# Patient Record
Sex: Female | Born: 1935
Health system: Southern US, Community
[De-identification: ages and names within clinical notes are randomized; demographics above are authoritative.]

## PROBLEM LIST (undated history)

## (undated) DIAGNOSIS — M1990 Unspecified osteoarthritis, unspecified site: Secondary | ICD-10-CM

## (undated) DIAGNOSIS — I10 Essential (primary) hypertension: Secondary | ICD-10-CM

## (undated) DIAGNOSIS — E7800 Pure hypercholesterolemia, unspecified: Secondary | ICD-10-CM

## (undated) DIAGNOSIS — Z0000 Encounter for general adult medical examination without abnormal findings: Secondary | ICD-10-CM

## (undated) DIAGNOSIS — M545 Low back pain: Secondary | ICD-10-CM

## (undated) DIAGNOSIS — R030 Elevated blood-pressure reading, without diagnosis of hypertension: Secondary | ICD-10-CM

## (undated) DIAGNOSIS — D649 Anemia, unspecified: Secondary | ICD-10-CM

## (undated) DIAGNOSIS — F430 Acute stress reaction: Secondary | ICD-10-CM

## (undated) HISTORY — DX: Encounter for general adult medical examination without abnormal findings: Z00.00

## (undated) HISTORY — DX: Essential (primary) hypertension: I10

## (undated) HISTORY — PX: CHOLECYSTECTOMY: SHX55

## (undated) HISTORY — DX: Low back pain: M54.5

## (undated) HISTORY — DX: Acute stress reaction: F43.0

## (undated) HISTORY — DX: Elevated blood-pressure reading, without diagnosis of hypertension: R03.0

## (undated) HISTORY — DX: Anemia, unspecified: D64.9

## (undated) HISTORY — DX: Unspecified osteoarthritis, unspecified site: M19.90

## (undated) HISTORY — DX: Pure hypercholesterolemia, unspecified: E78.00

## (undated) HISTORY — DX: Hypocalcemia: E83.51

---

## 1982-07-29 HISTORY — PX: GALLBLADDER SURGERY: SHX652

## 2011-08-09 DIAGNOSIS — J209 Acute bronchitis, unspecified: Secondary | ICD-10-CM | POA: Diagnosis not present

## 2011-08-09 DIAGNOSIS — I10 Essential (primary) hypertension: Secondary | ICD-10-CM | POA: Diagnosis not present

## 2011-08-16 DIAGNOSIS — M1990 Unspecified osteoarthritis, unspecified site: Secondary | ICD-10-CM | POA: Diagnosis not present

## 2011-08-16 DIAGNOSIS — E785 Hyperlipidemia, unspecified: Secondary | ICD-10-CM | POA: Diagnosis not present

## 2011-08-16 DIAGNOSIS — E559 Vitamin D deficiency, unspecified: Secondary | ICD-10-CM | POA: Diagnosis not present

## 2011-08-16 DIAGNOSIS — I10 Essential (primary) hypertension: Secondary | ICD-10-CM | POA: Diagnosis not present

## 2011-08-23 DIAGNOSIS — E785 Hyperlipidemia, unspecified: Secondary | ICD-10-CM | POA: Diagnosis not present

## 2011-08-23 DIAGNOSIS — M810 Age-related osteoporosis without current pathological fracture: Secondary | ICD-10-CM | POA: Diagnosis not present

## 2011-08-23 DIAGNOSIS — I10 Essential (primary) hypertension: Secondary | ICD-10-CM | POA: Diagnosis not present

## 2011-11-14 DIAGNOSIS — E785 Hyperlipidemia, unspecified: Secondary | ICD-10-CM | POA: Diagnosis not present

## 2011-11-14 DIAGNOSIS — I10 Essential (primary) hypertension: Secondary | ICD-10-CM | POA: Diagnosis not present

## 2011-11-22 DIAGNOSIS — E785 Hyperlipidemia, unspecified: Secondary | ICD-10-CM | POA: Diagnosis not present

## 2011-11-22 DIAGNOSIS — I10 Essential (primary) hypertension: Secondary | ICD-10-CM | POA: Diagnosis not present

## 2011-12-27 DIAGNOSIS — Z1231 Encounter for screening mammogram for malignant neoplasm of breast: Secondary | ICD-10-CM | POA: Diagnosis not present

## 2011-12-27 DIAGNOSIS — E785 Hyperlipidemia, unspecified: Secondary | ICD-10-CM | POA: Diagnosis not present

## 2011-12-27 DIAGNOSIS — Z23 Encounter for immunization: Secondary | ICD-10-CM | POA: Diagnosis not present

## 2011-12-27 DIAGNOSIS — I10 Essential (primary) hypertension: Secondary | ICD-10-CM | POA: Diagnosis not present

## 2011-12-27 DIAGNOSIS — M810 Age-related osteoporosis without current pathological fracture: Secondary | ICD-10-CM | POA: Diagnosis not present

## 2011-12-30 DIAGNOSIS — Z111 Encounter for screening for respiratory tuberculosis: Secondary | ICD-10-CM | POA: Diagnosis not present

## 2012-01-01 DIAGNOSIS — Z23 Encounter for immunization: Secondary | ICD-10-CM | POA: Diagnosis not present

## 2012-01-07 DIAGNOSIS — Z1231 Encounter for screening mammogram for malignant neoplasm of breast: Secondary | ICD-10-CM | POA: Diagnosis not present

## 2012-03-26 ENCOUNTER — Telehealth: Payer: Self-pay | Admitting: Internal Medicine

## 2012-03-26 NOTE — Telephone Encounter (Signed)
Received medical records from [facility]  P: [phone] F: [phone]

## 2012-04-24 ENCOUNTER — Ambulatory Visit (INDEPENDENT_AMBULATORY_CARE_PROVIDER_SITE_OTHER): Payer: Medicare Other | Admitting: Internal Medicine

## 2012-04-24 ENCOUNTER — Encounter: Payer: Self-pay | Admitting: Internal Medicine

## 2012-04-24 ENCOUNTER — Other Ambulatory Visit: Payer: Self-pay | Admitting: Internal Medicine

## 2012-04-24 VITALS — BP 141/83 | HR 88 | Temp 97.5°F | Resp 14 | Ht <= 58 in | Wt 106.5 lb

## 2012-04-24 DIAGNOSIS — Z79899 Other long term (current) drug therapy: Secondary | ICD-10-CM | POA: Diagnosis not present

## 2012-04-24 DIAGNOSIS — Z23 Encounter for immunization: Secondary | ICD-10-CM

## 2012-04-24 DIAGNOSIS — E782 Mixed hyperlipidemia: Secondary | ICD-10-CM

## 2012-04-24 DIAGNOSIS — I10 Essential (primary) hypertension: Secondary | ICD-10-CM

## 2012-04-24 DIAGNOSIS — M810 Age-related osteoporosis without current pathological fracture: Secondary | ICD-10-CM

## 2012-04-24 DIAGNOSIS — E559 Vitamin D deficiency, unspecified: Secondary | ICD-10-CM

## 2012-04-24 DIAGNOSIS — E785 Hyperlipidemia, unspecified: Secondary | ICD-10-CM | POA: Diagnosis not present

## 2012-04-24 HISTORY — DX: Age-related osteoporosis without current pathological fracture: M81.0

## 2012-04-24 HISTORY — DX: Mixed hyperlipidemia: E78.2

## 2012-04-24 HISTORY — DX: Essential (primary) hypertension: I10

## 2012-04-24 HISTORY — DX: Vitamin D deficiency, unspecified: E55.9

## 2012-04-24 LAB — CBC WITH DIFFERENTIAL/PLATELET
Lymphocytes Relative: 38 % (ref 12–46)
Lymphs Abs: 2.3 10*3/uL (ref 0.7–4.0)
Neutro Abs: 2.9 10*3/uL (ref 1.7–7.7)
Neutrophils Relative %: 48 % (ref 43–77)
Platelets: 253 10*3/uL (ref 150–400)
RBC: 4.96 MIL/uL (ref 3.87–5.11)
WBC: 5.9 10*3/uL (ref 4.0–10.5)

## 2012-04-24 MED ORDER — HYDROCHLOROTHIAZIDE 12.5 MG PO CAPS: 12.5000 mg | ORAL_CAPSULE | Freq: Every day | ORAL | Status: DC

## 2012-04-24 MED ORDER — SIMVASTATIN 40 MG PO TABS: 40.0000 mg | ORAL_TABLET | Freq: Every evening | ORAL | Status: DC

## 2012-04-24 MED ORDER — CARVEDILOL 12.5 MG PO TABS: 12.5000 mg | ORAL_TABLET | Freq: Every day | ORAL | Status: DC

## 2012-04-24 NOTE — Assessment & Plan Note (Signed)
Obtain fasting lipid profile and liver function test 

## 2012-04-24 NOTE — Progress Notes (Signed)
  Subjective:    Patient ID: Jennifer Wilkins, female    DOB: 1931-11-24, 78 y.o.   MRN: [id number]  HPI patient presented to clinic to establish primary medical care. Recently moved from [place]. Outside records reviewed. History of hyperlipidemia tolerating statin therapy without myalgias or abnormalities. States she does take half dose of 40 mg. Blood pressure is minimally elevated despite two agents. Outpatient control is unknown. Chart review indicates history of osteoporosis but is overdue for her bone density. Tolerates Fosamax on a weekly basis. Mammogram up-to-date June 2013. Believes tetanus shot and Pneumovax were updated twenty thirteen. Last reported colonoscopy nineteen ninety-seven but declines further colonoscopies in the future.  Past Medical History  Diagnosis Date  . Hypercholesteremia   . Arthritis   . HTN (hypertension)    Past Surgical History  Procedure Date  . Gallbladder surgery 1984    Gallstone removal    reports that she has never smoked. She does not have any smokeless tobacco history on file. Her alcohol and drug histories not on file. family history is not on file. No Known Allergies   Review of Systems  Musculoskeletal: Negative for myalgias.  All other systems reviewed and are negative.       Objective:   Physical Exam  Nursing note and vitals reviewed. Constitutional: She appears well-developed and well-nourished. No distress.  HENT:  Head: Normocephalic and atraumatic.  Right Ear: External ear normal.  Left Ear: External ear normal.  Eyes: Conjunctivae normal are normal. No scleral icterus.  Neck: Neck supple.  Cardiovascular: Normal rate, regular rhythm and normal heart sounds.  Exam reveals no gallop and no friction rub.   No murmur heard. Pulmonary/Chest: Effort normal and breath sounds normal. No respiratory distress. She has no wheezes. She has no rales.  Lymphadenopathy:    She has no cervical adenopathy.  Neurological: She is alert.    Skin: Skin is warm and dry. She is not diaphoretic.  Psychiatric: She has a normal mood and affect.          Assessment & Plan:

## 2012-04-24 NOTE — Assessment & Plan Note (Signed)
Schedule bone density. Continue Fosamax.

## 2012-04-24 NOTE — Assessment & Plan Note (Signed)
-   Obtain vitamin D level

## 2012-04-24 NOTE — Assessment & Plan Note (Addendum)
Minimal elevation. Continue current regimen and recommend outpatient blood pressure monitoring with results submitted to clinic for review. Obtain CBC and Chem-7

## 2012-04-25 LAB — BASIC METABOLIC PANEL
CO2: 29 mEq/L (ref 19–32)
Calcium: 9.4 mg/dL (ref 8.4–10.5)
Chloride: 101 mEq/L (ref 96–112)
Sodium: 140 mEq/L (ref 135–145)

## 2012-04-25 LAB — LIPID PANEL
HDL: 90 mg/dL (ref >39)
LDL Cholesterol: 116 mg/dL — ABNORMAL HIGH (ref 0–99)
Total CHOL/HDL Ratio: 2.5 Ratio

## 2012-04-25 LAB — HEPATIC FUNCTION PANEL
AST: 19 U/L (ref 0–37)
Albumin: 4.3 g/dL (ref 3.5–5.2)
Total Bilirubin: 0.8 mg/dL (ref 0.3–1.2)

## 2012-04-25 LAB — VITAMIN D 25 HYDROXY (VIT D DEFICIENCY, FRACTURES): Vit D, 25-Hydroxy: 43 ng/mL (ref 30–89)

## 2012-04-28 ENCOUNTER — Ambulatory Visit (INDEPENDENT_AMBULATORY_CARE_PROVIDER_SITE_OTHER): Payer: Medicare Other

## 2012-04-28 DIAGNOSIS — M810 Age-related osteoporosis without current pathological fracture: Secondary | ICD-10-CM | POA: Diagnosis not present

## 2012-04-28 DIAGNOSIS — Z780 Asymptomatic menopausal state: Secondary | ICD-10-CM | POA: Diagnosis not present

## 2012-05-28 ENCOUNTER — Telehealth: Payer: Self-pay | Admitting: *Deleted

## 2012-05-28 NOTE — Telephone Encounter (Signed)
Patient informed; Ok to start Reclast process with Insurance/SLS

## 2012-05-28 NOTE — Telephone Encounter (Signed)
Message copied by Regis Bill on Thu May 28, 2012  4:49 PM ------      Message from: Edwyna Perfect      Created: Sun May 24, 2012  2:03 PM       Has osteoporosis on bmd despite taking fosamax. Recommend reclast infusion if willing

## 2012-06-09 DIAGNOSIS — H04129 Dry eye syndrome of unspecified lacrimal gland: Secondary | ICD-10-CM | POA: Diagnosis not present

## 2012-07-07 DIAGNOSIS — F4322 Adjustment disorder with anxiety: Secondary | ICD-10-CM | POA: Diagnosis not present

## 2012-07-10 ENCOUNTER — Ambulatory Visit: Payer: Self-pay | Admitting: Internal Medicine

## 2012-07-10 ENCOUNTER — Encounter: Payer: Self-pay | Admitting: Internal Medicine

## 2012-07-10 ENCOUNTER — Ambulatory Visit (INDEPENDENT_AMBULATORY_CARE_PROVIDER_SITE_OTHER): Payer: Medicare Other | Admitting: Internal Medicine

## 2012-07-10 VITALS — BP 130/82 | HR 92 | Temp 97.7°F | Resp 14 | Wt 108.2 lb

## 2012-07-10 DIAGNOSIS — M810 Age-related osteoporosis without current pathological fracture: Secondary | ICD-10-CM | POA: Diagnosis not present

## 2012-07-10 DIAGNOSIS — I10 Essential (primary) hypertension: Secondary | ICD-10-CM | POA: Diagnosis not present

## 2012-07-10 NOTE — Progress Notes (Signed)
  Subjective:    Patient ID: Jennifer Wilkins, female    DOB: [birth year], 78 y.o.   MRN: [id number]  HPI Pt presents to clinic for followup of multiple medical problems. bp rechecked 130/82 and has been normal on home monitoring.no active complaints.  Past Medical History  Diagnosis Date  . Hypercholesteremia   . Arthritis   . HTN (hypertension)    Past Surgical History  Procedure Date  . Gallbladder surgery 1984    Gallstone removal    reports that she has never smoked. She does not have any smokeless tobacco history on file. Her alcohol and drug histories not on file. family history is not on file. No Known Allergies    Review of Systems see hpi     Objective:   Physical Exam  Physical Exam  Nursing note and vitals reviewed. Constitutional: Appears well-developed and well-nourished. No distress.  HENT:  Head: Normocephalic and atraumatic.  Right Ear: External ear normal.  Left Ear: External ear normal.  Eyes: Conjunctivae are normal. No scleral icterus.  Neck: Neck supple. Carotid bruit is not present.  Cardiovascular: Normal rate, regular rhythm and normal heart sounds.  Exam reveals no gallop and no friction rub.   No murmur heard. Pulmonary/Chest: Effort normal and breath sounds normal. No respiratory distress. He has no wheezes. no rales.  Lymphadenopathy:    He has no cervical adenopathy.  Neurological:Alert.  Skin: Skin is warm and dry. Not diaphoretic.  Psychiatric: Has a normal mood and affect.        Assessment & Plan:

## 2012-07-11 NOTE — Assessment & Plan Note (Signed)
Schedule reclast

## 2012-07-11 NOTE — Assessment & Plan Note (Signed)
Normotensive and stable. Continue current regimen. Monitor bp as outpt and followup in clinic as scheduled.  

## 2012-07-13 ENCOUNTER — Telehealth: Payer: Self-pay | Admitting: *Deleted

## 2012-07-13 DIAGNOSIS — M810 Age-related osteoporosis without current pathological fracture: Secondary | ICD-10-CM

## 2012-07-13 DIAGNOSIS — Z01812 Encounter for preprocedural laboratory examination: Secondary | ICD-10-CM

## 2012-07-13 NOTE — Telephone Encounter (Signed)
Informed pt that labs are needed prior to placing Order with Short Stay Infusion facility to arrange appointment for Reclast infusion; informed pt that facility will be filing Insurance & she can either speak w/Outpatient Infusion Center or call her Insurance directly for coverage information RE: Reclast infusion. Pt will be in this week to have labs done; orders placed/SLS

## 2012-07-13 NOTE — Telephone Encounter (Signed)
Started Reclast process on Friday, 12.13.13; attempting to find out how to start process w/o "Reclast & You" program [canceled d/t budget]/SLS

## 2012-07-16 DIAGNOSIS — M810 Age-related osteoporosis without current pathological fracture: Secondary | ICD-10-CM | POA: Diagnosis not present

## 2012-07-16 DIAGNOSIS — Z01812 Encounter for preprocedural laboratory examination: Secondary | ICD-10-CM | POA: Diagnosis not present

## 2012-07-16 NOTE — Telephone Encounter (Signed)
Orders released and given to the lab.

## 2012-07-17 ENCOUNTER — Other Ambulatory Visit: Payer: Self-pay | Admitting: *Deleted

## 2012-07-17 DIAGNOSIS — M810 Age-related osteoporosis without current pathological fracture: Secondary | ICD-10-CM

## 2012-07-17 NOTE — Progress Notes (Signed)
Faxed order for Reclast to [facility] Short Stay w/ lab results/SLS

## 2012-07-20 ENCOUNTER — Ambulatory Visit: Payer: TRICARE For Life (TFL) | Admitting: Internal Medicine

## 2012-08-05 ENCOUNTER — Encounter (HOSPITAL_COMMUNITY): Payer: Medicare Other

## 2012-08-05 ENCOUNTER — Ambulatory Visit (HOSPITAL_COMMUNITY): Admission: RE | Admit: 2012-08-05 | Payer: Medicare Other | Source: Ambulatory Visit

## 2012-08-10 ENCOUNTER — Telehealth: Payer: Self-pay | Admitting: *Deleted

## 2012-08-10 ENCOUNTER — Encounter (HOSPITAL_COMMUNITY): Payer: Self-pay

## 2012-08-10 ENCOUNTER — Ambulatory Visit (HOSPITAL_COMMUNITY)
Admission: RE | Admit: 2012-08-10 | Discharge: 2012-08-10 | Disposition: A | Discharge status: Home / Self Care | Payer: Medicare Other | Source: Ambulatory Visit | Attending: Internal Medicine | Admitting: Internal Medicine

## 2012-08-10 VITALS — BP 110/77 | HR 95 | Temp 97.7°F | Resp 16

## 2012-08-10 DIAGNOSIS — M810 Age-related osteoporosis without current pathological fracture: Secondary | ICD-10-CM | POA: Insufficient documentation

## 2012-08-10 DIAGNOSIS — Z01812 Encounter for preprocedural laboratory examination: Secondary | ICD-10-CM

## 2012-08-10 MED ORDER — SODIUM CHLORIDE 0.9 % IV SOLN
INTRAVENOUS | Status: DC
  Administered 2012-08-10: 14:00:00 via INTRAVENOUS

## 2012-08-10 MED ORDER — ZOLEDRONIC ACID 5 MG/100ML IV SOLN: 5.0000 mg | Freq: Once | INTRAVENOUS | Status: DC

## 2012-08-10 NOTE — Telephone Encounter (Signed)
Received call from [place] at [facility] Stay stating pt is there for reclast infusion and pt reports that she is currently taking fosamax. [place] states that Reclast is not supposed to be given if pt is on Fosamax. She is wanting to know if it is ok to proceed with Reclast or get clarification on how you would like them to proceed?  Please advise.

## 2012-08-10 NOTE — Progress Notes (Signed)
Patient currently taking Fosamax. Last dose today. Called Dr. Rodena Medin to verify if patient should receive Reclast while taking Fosamax. He stated not to give Reclast infusion today, instruct  to discontinue Fosamax and office will call to reschedule. I instructed patient to stop taking Fosamax. She verbalized understanding. Also informed to follow up with Dr. Ilda Foil office for questions and concerns. She was discharged ambulatory with husband.

## 2012-08-10 NOTE — Telephone Encounter (Signed)
Per verbal from Provider, advised [place] to cancel Reclast infusion today and please reiterate to pt not to take any further Fosamax and let pt know we will contact them for follow up.

## 2012-08-11 NOTE — Telephone Encounter (Signed)
Informed patient of provider recommendations, had to reiterate about D/C Fosamax [told patient to discard medication completely, so as not to forget that she will no longer take this Rx], as she will be starting a new form of medication for her bone loss prevention; understood & agreed/SLS Spoke w/[facility] short stay; labs & new order will have to be repeated/SLS

## 2012-08-11 NOTE — Telephone Encounter (Signed)
Wait 1 month then proceed

## 2012-08-14 NOTE — Telephone Encounter (Signed)
Informed patient that she will need to re-do [calcium & creatinine] labs d/t prior results will go past the 30-day period that is required prior to Reclast infusion, pt will come in no earlier than week of 01.27.14; pt understood & agreed, will fax new order after repeat labs/SLS

## 2012-09-01 ENCOUNTER — Telehealth: Payer: Self-pay | Admitting: Internal Medicine

## 2012-09-01 NOTE — Telephone Encounter (Signed)
PATIENT WAS SCHEDULED FOR RECLAST AT [facility] ON 1-8.  sHE WAS NOT AWARE OF THIS APPT. IT WAS RESCHEDULED TO 08-10-2012.  PATIENT ARRIVED AT [facility] FOR HER RECLAST.  IV WAS STARTED AND THEN THE NURSE ASKED ABOUT HER MEDS.  SHE HAD NOT BEEN TOLD TO STOP TAKING HER ALENDRONATE SOD 70MG  PREVIOUS TO THIS INFUSION.  THEY DID NOT DO THE INFUSION BUT THEY BILLED MEDICARE.  NOW THE PATIENT CANNOT HAVE IT DONE AS MEDICARE THINKS IT ALREADY HAS BEEN.  WILL SEND E MAIL TO MANAGER OF SHORTSTAY AT [facility] AND TO BRIAN HUNT TO TRY AND RESOLVE THIS ISSUE SO PATIENT CAN HAVE HER INFUSION.  SHE WILL NEED LABS WITHIN 30 DAYS PRIOR, SO LET THE PATIENT KNOW SOON ENOUGH PRIOR TO APPT TO HAVE THESE DONE

## 2012-09-01 NOTE — Telephone Encounter (Signed)
Let us know when this gets resolved so labs can be ordered and pt notified.

## 2012-09-25 NOTE — Telephone Encounter (Signed)
Sent this information to Rolinda Roan asking how to resolve this.  He advised he really did not know as he cannot even see hospital charges.  I left a message for the patient that she will have to call [facility] and get it straightened out with them as we cannot help with hospital charges

## 2012-09-25 NOTE — Telephone Encounter (Signed)
See above

## 2012-11-03 ENCOUNTER — Encounter: Payer: Self-pay | Admitting: Family Medicine

## 2012-11-03 ENCOUNTER — Other Ambulatory Visit: Payer: Self-pay | Admitting: Family Medicine

## 2012-11-03 ENCOUNTER — Ambulatory Visit (INDEPENDENT_AMBULATORY_CARE_PROVIDER_SITE_OTHER): Payer: Medicare Other | Admitting: Family Medicine

## 2012-11-03 VITALS — BP 138/88 | HR 83 | Temp 97.6°F | Ht <= 58 in | Wt 110.1 lb

## 2012-11-03 DIAGNOSIS — E785 Hyperlipidemia, unspecified: Secondary | ICD-10-CM | POA: Diagnosis not present

## 2012-11-03 DIAGNOSIS — M810 Age-related osteoporosis without current pathological fracture: Secondary | ICD-10-CM

## 2012-11-03 DIAGNOSIS — I10 Essential (primary) hypertension: Secondary | ICD-10-CM

## 2012-11-03 DIAGNOSIS — E559 Vitamin D deficiency, unspecified: Secondary | ICD-10-CM

## 2012-11-03 DIAGNOSIS — E7800 Pure hypercholesterolemia, unspecified: Secondary | ICD-10-CM

## 2012-11-03 LAB — BASIC METABOLIC PANEL
CO2: 30 mEq/L (ref 19–32)
Calcium: 9.9 mg/dL (ref 8.4–10.5)
Creat: 0.8 mg/dL (ref 0.50–1.10)
Glucose, Bld: 80 mg/dL (ref 70–99)

## 2012-11-03 LAB — CBC
HCT: 43.9 % (ref 36.0–46.0)
Hemoglobin: 14.9 g/dL (ref 12.0–15.0)
MCHC: 33.9 g/dL (ref 30.0–36.0)
MCV: 84.6 fL (ref 78.0–100.0)

## 2012-11-03 LAB — PHOSPHORUS: Phosphorus: 3.4 mg/dL (ref 2.3–4.6)

## 2012-11-03 LAB — HEPATIC FUNCTION PANEL
ALT: 17 U/L (ref 0–35)
AST: 18 U/L (ref 0–37)
Bilirubin, Direct: 0.2 mg/dL (ref 0.0–0.3)
Total Protein: 7 g/dL (ref 6.0–8.3)

## 2012-11-03 LAB — TSH: TSH: 1.908 u[IU]/mL (ref 0.350–4.500)

## 2012-11-03 NOTE — Patient Instructions (Addendum)

## 2012-11-03 NOTE — Assessment & Plan Note (Signed)
Well controlled but encouraged to consider the DASH diet.

## 2012-11-03 NOTE — Progress Notes (Signed)
Patient ID: Jennifer Wilkins, female   DOB: May 04, 1932, 79 y.o.   MRN: [id number] Jennifer Wilkins [id number] Feb 06, 1932 11/03/2012      Progress Note-Follow Up  Subjective  Chief Complaint  Chief Complaint  Patient presents with  . Follow-up    4 month    HPI  Patient is a 79 year old female comes in today for followup. She. Dispense recent illness. She has tried to have frequent infusions twice but has been unsuccessful. No headaches or chest pain. No palpitations or shortness of breath. No GI or GU complaints.  Past Medical History  Diagnosis Date  . Hypercholesteremia   . Arthritis   . HTN (hypertension)     Past Surgical History  Procedure Laterality Date  . Gallbladder surgery  1984    Gallstone removal    History reviewed. No pertinent family history.  History   Social History  . Marital Status: Married    Spouse Name: N/A    Number of Children: N/A  . Years of Education: N/A   Occupational History  . Not on file.   Social History Main Topics  . Smoking status: Never Smoker   . Smokeless tobacco: Never Used  . Alcohol Use: No  . Drug Use: Not on file  . Sexually Active: Not on file   Other Topics Concern  . Not on file   Social History Narrative  . No narrative on file    Current Outpatient Prescriptions on File Prior to Visit  Medication Sig Dispense Refill  . aspirin 81 MG tablet Take 81 mg by mouth daily.      . Calcium Carbonate-Vitamin D (CALCIUM 600/VITAMIN D) 600-400 MG-UNIT per tablet Take 1 tablet by mouth 2 (two) times daily.      . carvedilol (COREG) 12.5 MG tablet Take 1 tablet (12.5 mg total) by mouth daily.  90 tablet  2  . cholecalciferol (VITAMIN D) 400 UNITS TABS Take 2,000 Units by mouth daily.      . Cyanocobalamin (VITAMIN B-12 CR) 1500 MCG TBCR Take by mouth daily.      . hydrochlorothiazide (MICROZIDE) 12.5 MG capsule Take 1 capsule (12.5 mg total) by mouth daily.  90 capsule  2  . Misc Natural Products (OSTEO BI-FLEX ADV JOINT SHIELD  PO) Take by mouth daily.      . simvastatin (ZOCOR) 40 MG tablet Take 1 tablet (40 mg total) by mouth every evening.  90 tablet  2  . alendronate (FOSAMAX) 70 MG tablet Take 70 mg by mouth every 7 (seven) days. Take with a full glass of water on an empty stomach.       No current facility-administered medications on file prior to visit.    No Known Allergies  Review of Systems  Review of Systems  Constitutional: Negative for fever and malaise/fatigue.  HENT: Negative for congestion.   Eyes: Negative for discharge.  Respiratory: Negative for shortness of breath.   Cardiovascular: Negative for chest pain, palpitations and leg swelling.  Gastrointestinal: Negative for nausea, abdominal pain and diarrhea.  Genitourinary: Negative for dysuria.  Musculoskeletal: Negative for falls.  Skin: Negative for rash.  Neurological: Negative for loss of consciousness and headaches.  Endo/Heme/Allergies: Negative for polydipsia.  Psychiatric/Behavioral: Negative for depression and suicidal ideas. The patient is not nervous/anxious and does not have insomnia.     Objective  BP 138/88  Pulse 83  Temp(Src) 97.6 F (36.4 C) (Oral)  Ht 4\' 9"  (1.448 m)  Wt 110 lb 1.3 oz (49.932  kg)  BMI 23.81 kg/m2  SpO2 97%  Physical Exam  Physical Exam  Constitutional: She is oriented to person, place, and time and well-developed, well-nourished, and in no distress. No distress.  HENT:  Head: Normocephalic and atraumatic.  Eyes: Conjunctivae are normal.  Neck: Neck supple. No thyromegaly present.  Cardiovascular: Normal rate, regular rhythm and normal heart sounds.   No murmur heard. Pulmonary/Chest: Effort normal and breath sounds normal. She has no wheezes.  Abdominal: She exhibits no distension and no mass.  Musculoskeletal: She exhibits no edema.  Lymphadenopathy:    She has no cervical adenopathy.  Neurological: She is alert and oriented to person, place, and time.  Skin: Skin is warm and dry. No  rash noted. She is not diaphoretic.  Psychiatric: Memory, affect and judgment normal.    No results found for this basename: TSH   Lab Results  Component Value Date   WBC 5.4 11/03/2012   HGB 14.9 11/03/2012   HCT 43.9 11/03/2012   MCV 84.6 11/03/2012   PLT 223 11/03/2012   Lab Results  Component Value Date   CREATININE 0.85 07/16/2012   BUN 21 04/24/2012   NA 140 04/24/2012   K 4.0 04/24/2012   CL 101 04/24/2012   CO2 29 04/24/2012   Lab Results  Component Value Date   ALT 15 04/24/2012   AST 19 04/24/2012   ALKPHOS 58 04/24/2012   BILITOT 0.8 04/24/2012   Lab Results  Component Value Date   CHOL 224* 04/24/2012   Lab Results  Component Value Date   HDL 90 04/24/2012   Lab Results  Component Value Date   LDLCALC 116* 04/24/2012   Lab Results  Component Value Date   TRIG 90 04/24/2012   Lab Results  Component Value Date   CHOLHDL 2.5 04/24/2012     Assessment & Plan  Osteoporosis She was supposed to have a Reclast infusion but was unaware of her first appt and had taken her Fosamax prior to her next appt so never actually received the infusion. Unfortunately she and her family report they received a bill for the medication that she never received. Patient is not interested in returning for an infusion again after this but will consider Prolia.   Other and unspecified hyperlipidemia Tolerating Simvastatin, no changes  Unspecified essential hypertension Well controlled but encouraged to consider the DASH diet.

## 2012-11-03 NOTE — Assessment & Plan Note (Signed)
Tolerating Simvastatin, no changes 

## 2012-11-03 NOTE — Assessment & Plan Note (Signed)
She was supposed to have a Reclast infusion but was unaware of her first appt and had taken her Fosamax prior to her next appt so never actually received the infusion. Unfortunately she and her family report they received a bill for the medication that she never received. Patient is not interested in returning for an infusion again after this but will consider Prolia.

## 2013-01-07 ENCOUNTER — Telehealth: Payer: Self-pay

## 2013-01-07 NOTE — Telephone Encounter (Signed)
So just let me know when we know what it is

## 2013-01-07 NOTE — Telephone Encounter (Signed)
Pt states she is faxing some paperwork from the insurance company stating that they are no longer going to cover some medication

## 2013-01-08 ENCOUNTER — Other Ambulatory Visit: Payer: Self-pay

## 2013-01-08 MED ORDER — SIMVASTATIN 40 MG PO TABS: 40.0000 mg | ORAL_TABLET | Freq: Every evening | ORAL | Status: DC

## 2013-01-25 ENCOUNTER — Other Ambulatory Visit: Payer: Self-pay

## 2013-01-25 MED ORDER — HYDROCHLOROTHIAZIDE 12.5 MG PO CAPS: 12.5000 mg | ORAL_CAPSULE | Freq: Every day | ORAL | Status: DC

## 2013-01-25 MED ORDER — CARVEDILOL 12.5 MG PO TABS: 12.5000 mg | ORAL_TABLET | Freq: Every day | ORAL | Status: DC

## 2013-03-21 ENCOUNTER — Other Ambulatory Visit: Payer: Self-pay | Admitting: Family Medicine

## 2013-04-28 DIAGNOSIS — Z23 Encounter for immunization: Secondary | ICD-10-CM | POA: Diagnosis not present

## 2013-06-07 ENCOUNTER — Telehealth: Payer: Self-pay | Admitting: *Deleted

## 2013-06-07 ENCOUNTER — Other Ambulatory Visit: Payer: Self-pay | Admitting: Family Medicine

## 2013-06-07 DIAGNOSIS — I10 Essential (primary) hypertension: Secondary | ICD-10-CM

## 2013-06-07 DIAGNOSIS — E785 Hyperlipidemia, unspecified: Secondary | ICD-10-CM

## 2013-06-07 DIAGNOSIS — Z79899 Other long term (current) drug therapy: Secondary | ICD-10-CM

## 2013-06-07 LAB — LIPID PANEL
HDL: 84 mg/dL (ref >39)
LDL Cholesterol: 58 mg/dL (ref 0–99)
Total CHOL/HDL Ratio: 1.8 Ratio
VLDL: 13 mg/dL (ref 0–40)

## 2013-06-07 LAB — CBC
HCT: 42 % (ref 36.0–46.0)
MCV: 86.6 fL (ref 78.0–100.0)
RBC: 4.85 MIL/uL (ref 3.87–5.11)
WBC: 4.8 10*3/uL (ref 4.0–10.5)

## 2013-06-07 LAB — HEPATIC FUNCTION PANEL
ALT: 16 U/L (ref 0–35)
Bilirubin, Direct: 0.2 mg/dL (ref 0.0–0.3)
Total Protein: 6.5 g/dL (ref 6.0–8.3)

## 2013-06-07 LAB — BASIC METABOLIC PANEL
Potassium: 3.9 mEq/L (ref 3.5–5.3)
Sodium: 139 mEq/L (ref 135–145)

## 2013-06-07 NOTE — Telephone Encounter (Signed)
Lab orders placed, forwarded to [facility]/SLS

## 2013-06-08 ENCOUNTER — Other Ambulatory Visit: Payer: Self-pay

## 2013-06-08 LAB — TSH: TSH: 1.534 u[IU]/mL (ref 0.350–4.500)

## 2013-06-14 ENCOUNTER — Ambulatory Visit (INDEPENDENT_AMBULATORY_CARE_PROVIDER_SITE_OTHER): Payer: Medicare Other | Admitting: Family Medicine

## 2013-06-14 ENCOUNTER — Encounter: Payer: Self-pay | Admitting: Family Medicine

## 2013-06-14 VITALS — BP 120/78 | HR 87 | Temp 97.7°F | Ht <= 58 in | Wt 115.0 lb

## 2013-06-14 DIAGNOSIS — E785 Hyperlipidemia, unspecified: Secondary | ICD-10-CM

## 2013-06-14 DIAGNOSIS — Z23 Encounter for immunization: Secondary | ICD-10-CM | POA: Diagnosis not present

## 2013-06-14 DIAGNOSIS — E559 Vitamin D deficiency, unspecified: Secondary | ICD-10-CM

## 2013-06-14 DIAGNOSIS — M810 Age-related osteoporosis without current pathological fracture: Secondary | ICD-10-CM | POA: Diagnosis not present

## 2013-06-14 DIAGNOSIS — I10 Essential (primary) hypertension: Secondary | ICD-10-CM | POA: Diagnosis not present

## 2013-06-14 MED ORDER — PNEUMOCOCCAL 13-VAL CONJ VACC IM SUSP: 0.5000 mL | Freq: Once | INTRAMUSCULAR | Status: DC

## 2013-06-14 NOTE — Patient Instructions (Signed)
Hypocalcemia, Adult °Hypocalcemia is low blood calcium. Calcium is important for cells to function in the body. Low blood calcium can cause a variety of symptoms and problems. °CAUSES  °· Low levels of a body protein called albumin. °· Problems with the parathyroid glands or surgical removal of the parathyroid glands. The parathyroid glands maintain the body's level of calcium. °· Decreased production or improper use of parathyroid hormone. °· Lack (deficiency) of vitamin D or magnesium or both. °· Intestinal problems that interfere with nutrient absorption. °· Alcoholism. °· Kidney problems. °· Inflammation of the pancreas (pancreatitis). °· Certain medicines. °· Severe infections (sepsis). °· Infiltrative diseases. With these diseases the parathyroid glands are filled with cells or substances that are not normally present. Examples include: °· Sarcoidosis. °· Hemachromatosis. °· Breakdown of large amounts of muscle fiber. °· High levels of phosphate in the body. °· Cancer. °· Massive blood transfusions which usually occur with severe trauma. °SYMPTOMS  °· Numbness and tingling in the fingers, toes, or around the mouth. °· Muscle aches or cramps, especially in the legs, feet, and back. °· Muscle twitches. °· Shortness of breath or wheezing. °· Difficulty swallowing. °· Changes in the sound of the voice. °· General weakness. °· Fainting. °· Fast heart beats (palpitations). °· Chest pain. °· Irritability. °· Difficulty thinking. °· Memory problems or confusion. °· Severe fatigue. °· Changes in personality. °· Depression and anxiety. °· Shaking uncontrollably (seizures). °· Coarse, brittle hair and nails. °· Dry skin or lasting (chronic) skin diseases (psoriasis, eczema, or dermatitis). °· Clouding of the eye lens (cataracts). °· Abdominal cramping or pain. °DIAGNOSIS  °Hypocalcemia is usually diagnosed through blood tests that reveal a low level of blood calcium. Other tests, such as a recording of the electrical  activity of the heart (electrocardiogram, EKG), may be performed in order to diagnose the underlying cause of the condition. °TREATMENT  °Treatment for hypocalcemia includes giving calcium supplements. These can be given by mouth or by intravenous (IV) access tube, depending on the severity of the symptoms and deficiency. Other minerals (electrolytes), such as magnesium, may also be given. °HOME CARE INSTRUCTIONS  °· Meet with a dietitian to make sure you are eating the most healthful diet possible, or follow diet instructions as directed by your caregiver. °· Follow up with your caregiver as directed. °SEEK IMMEDIATE MEDICAL CARE IF:  °· You develop chest pain. °· You develop persistent rapid or irregular heartbeats. °· You have difficulty breathing. °· You faint. °· You develop increased fatigue. °· You have new swelling in the feet, ankles, or legs. °· You develop increased muscle twitching. °· You start to have seizures. °· You develop confusion. °· You develop mood, memory, or personality changes. °MAKE SURE YOU:  °· Understand these instructions. °· Will watch your condition. °· Will get help right away if you are not doing well or get worse. °Document Released: 01/02/2010 Document Revised: 10/07/2011 Document Reviewed: 01/02/2010 °[employer]® Patient Information ©2014 [employer]. ° °

## 2013-06-14 NOTE — Progress Notes (Signed)
Pre visit review using our clinic review tool, if applicable. No additional management support is needed unless otherwise documented below in the visit note. 

## 2013-06-17 ENCOUNTER — Other Ambulatory Visit: Payer: Self-pay | Admitting: Family Medicine

## 2013-06-19 ENCOUNTER — Encounter: Payer: Self-pay | Admitting: Family Medicine

## 2013-06-19 HISTORY — DX: Hypocalcemia: E83.51

## 2013-06-19 NOTE — Assessment & Plan Note (Signed)
Well controlled, no changes to meds 

## 2013-06-19 NOTE — Assessment & Plan Note (Signed)
Was supposed to get a Reclast infusion but never did somehow in the process thought they report they were billed accidentally. Will run this by billing, may contine Ca/vit d and Fosamax for now. Consider Prolia in future

## 2013-06-19 NOTE — Assessment & Plan Note (Signed)
Well controlled 

## 2013-06-19 NOTE — Assessment & Plan Note (Signed)
Good response to Simvastatin, no changes. Avoid trans fats

## 2013-06-19 NOTE — Assessment & Plan Note (Signed)
New, increase citracal to tid and repeat renal panel with [facility] in 1-2 weeks. Asymptomatic

## 2013-06-19 NOTE — Progress Notes (Signed)
Patient ID: Jennifer Wilkins, female   DOB: Jan 03, 1932, 79 y.o.   MRN: [id number] Jennifer Wilkins [id number] 1932-01-10 06/19/2013      Progress Note-Follow Up  Subjective  Chief Complaint  Chief Complaint  Patient presents with  . Follow-up    6 month    HPI  Patient is a 21 Caucasian female who is in today by her husband and her daughter. Doing well. Did not have any recent major illness in the emergency room and she is taking her medications as prescribed and tolerating them well. They deny headaches, chest pain, palpitations, shortness of breath, muscle cramps, GI or GU concerns at this time.  Past Medical History  Diagnosis Date  . Hypercholesteremia   . Arthritis   . HTN (hypertension)   . Hypocalcemia 06/19/2013    Past Surgical History  Procedure Laterality Date  . Gallbladder surgery  1984    Gallstone removal    History reviewed. No pertinent family history.  History   Social History  . Marital Status: Married    Spouse Name: N/A    Number of Children: N/A  . Years of Education: N/A   Occupational History  . Not on file.   Social History Main Topics  . Smoking status: Never Smoker   . Smokeless tobacco: Never Used  . Alcohol Use: No  . Drug Use: Not on file  . Sexual Activity: Not on file   Other Topics Concern  . Not on file   Social History Narrative  . No narrative on file    Current Outpatient Prescriptions on File Prior to Visit  Medication Sig Dispense Refill  . alendronate (FOSAMAX) 70 MG tablet Take 70 mg by mouth every 7 (seven) days. Take with a full glass of water on an empty stomach.      Marland Kitchen aspirin 81 MG tablet Take 81 mg by mouth daily.      . Calcium Carbonate-Vitamin D (CALCIUM 600/VITAMIN D) 600-400 MG-UNIT per tablet Take 1 tablet by mouth 2 (two) times daily.      . carvedilol (COREG) 12.5 MG tablet Take 1 tablet (12.5 mg total) by mouth daily.  90 tablet  1  . cholecalciferol (VITAMIN D) 400 UNITS TABS Take 2,000 Units by mouth daily.       . Cyanocobalamin (VITAMIN B-12 CR) 1500 MCG TBCR Take by mouth daily.      . Misc Natural Products (OSTEO BI-FLEX ADV JOINT SHIELD PO) Take by mouth daily.      . simvastatin (ZOCOR) 40 MG tablet TAKE 1 TABLET EVERY EVENING  90 tablet  1   No current facility-administered medications on file prior to visit.    No Known Allergies  Review of Systems  Review of Systems  Constitutional: Negative for fever and malaise/fatigue.  HENT: Negative for congestion.   Eyes: Negative for discharge.  Respiratory: Negative for shortness of breath.   Cardiovascular: Negative for chest pain, palpitations and leg swelling.  Gastrointestinal: Negative for nausea, abdominal pain and diarrhea.  Genitourinary: Negative for dysuria.  Musculoskeletal: Negative for falls.  Skin: Negative for rash.  Neurological: Negative for loss of consciousness and headaches.  Endo/Heme/Allergies: Negative for polydipsia.  Psychiatric/Behavioral: Negative for depression and suicidal ideas. The patient is not nervous/anxious and does not have insomnia.     Objective  BP 120/78  Pulse 87  Temp(Src) 97.7 F (36.5 C) (Oral)  Ht 4\' 9"  (1.448 m)  Wt 115 lb (52.164 kg)  BMI 24.88 kg/m2  SpO2  96%  Physical Exam  Physical Exam  Constitutional: She is oriented to person, place, and time and well-developed, well-nourished, and in no distress. No distress.  HENT:  Head: Normocephalic and atraumatic.  Eyes: Conjunctivae are normal.  Neck: Neck supple. No thyromegaly present.  Cardiovascular: Normal rate, regular rhythm and normal heart sounds.   No murmur heard. Pulmonary/Chest: Effort normal and breath sounds normal. She has no wheezes.  Abdominal: She exhibits no distension and no mass.  Musculoskeletal: She exhibits no edema.  Lymphadenopathy:    She has no cervical adenopathy.  Neurological: She is alert and oriented to person, place, and time.  Skin: Skin is warm and dry. No rash noted. She is not  diaphoretic.  Psychiatric: Memory, affect and judgment normal.    Lab Results  Component Value Date   TSH 1.534 06/07/2013   Lab Results  Component Value Date   WBC 4.8 06/07/2013   HGB 14.2 06/07/2013   HCT 42.0 06/07/2013   MCV 86.6 06/07/2013   PLT 219 06/07/2013   Lab Results  Component Value Date   CREATININE 0.83 06/07/2013   BUN 21 06/07/2013   NA 139 06/07/2013   K 3.9 06/07/2013   CL 103 06/07/2013   CO2 26 06/07/2013   Lab Results  Component Value Date   ALT 16 06/07/2013   AST 19 06/07/2013   ALKPHOS 58 06/07/2013   BILITOT 1.1 06/07/2013   Lab Results  Component Value Date   CHOL 155 06/07/2013   Lab Results  Component Value Date   HDL 84 06/07/2013   Lab Results  Component Value Date   LDLCALC 58 06/07/2013   Lab Results  Component Value Date   TRIG 67 06/07/2013   Lab Results  Component Value Date   CHOLHDL 1.8 06/07/2013     Assessment & Plan  Unspecified essential hypertension Well controlled, no changes to meds  Osteoporosis Was supposed to get a Reclast infusion but never did somehow in the process thought they report they were billed accidentally. Will run this by billing, may contine Ca/vit d and Fosamax for now. Consider Prolia in future  Vitamin D deficiency Well controlled  Other and unspecified hyperlipidemia Good response to Simvastatin, no changes. Avoid trans fats  Hypocalcemia New, increase citracal to tid and repeat renal panel with [facility] in 1-2 weeks. Asymptomatic

## 2013-07-14 ENCOUNTER — Telehealth: Payer: Self-pay | Admitting: *Deleted

## 2013-07-14 LAB — BASIC METABOLIC PANEL
BUN: 28 mg/dL — ABNORMAL HIGH (ref 6–23)
Chloride: 101 mEq/L (ref 96–112)
Creat: 0.87 mg/dL (ref 0.50–1.10)

## 2013-07-14 NOTE — Telephone Encounter (Signed)
Pt presented to lab w/o orders; placed from last AVS/SLS

## 2013-07-16 LAB — PTH, INTACT AND CALCIUM
Calcium: 9.3 mg/dL (ref 8.4–10.5)
PTH: 55.2 pg/mL (ref 14.0–72.0)

## 2013-08-13 ENCOUNTER — Other Ambulatory Visit: Payer: Self-pay | Admitting: Family Medicine

## 2013-09-10 ENCOUNTER — Telehealth: Payer: Self-pay | Admitting: Family Medicine

## 2013-09-10 MED ORDER — HYDROCHLOROTHIAZIDE 12.5 MG PO CAPS: ORAL_CAPSULE | ORAL | Status: DC

## 2013-09-10 MED ORDER — CARVEDILOL 12.5 MG PO TABS: 12.5000 mg | ORAL_TABLET | Freq: Every day | ORAL | Status: DC

## 2013-09-10 NOTE — Telephone Encounter (Signed)
Rx request to pharmacy/SLS  

## 2013-09-10 NOTE — Telephone Encounter (Signed)
Carvedilol 12.5mg  take 1 per day  Qty 90  hydrochlorathiazide 12.5 1 per day  Qty 90

## 2013-09-12 ENCOUNTER — Other Ambulatory Visit: Payer: Self-pay | Admitting: Family Medicine

## 2013-09-20 ENCOUNTER — Ambulatory Visit: Payer: Medicare Other | Admitting: Family Medicine

## 2013-10-21 ENCOUNTER — Ambulatory Visit (INDEPENDENT_AMBULATORY_CARE_PROVIDER_SITE_OTHER): Payer: Medicare Other | Admitting: Family Medicine

## 2013-10-21 ENCOUNTER — Encounter: Payer: Self-pay | Admitting: Family Medicine

## 2013-10-21 ENCOUNTER — Telehealth: Payer: Self-pay | Admitting: Family Medicine

## 2013-10-21 DIAGNOSIS — I10 Essential (primary) hypertension: Secondary | ICD-10-CM

## 2013-10-21 DIAGNOSIS — M810 Age-related osteoporosis without current pathological fracture: Secondary | ICD-10-CM | POA: Diagnosis not present

## 2013-10-21 DIAGNOSIS — E785 Hyperlipidemia, unspecified: Secondary | ICD-10-CM

## 2013-10-21 LAB — RENAL FUNCTION PANEL
ALBUMIN: 4.2 g/dL (ref 3.5–5.2)
BUN: 23 mg/dL (ref 6–23)
CHLORIDE: 102 meq/L (ref 96–112)
CO2: 30 meq/L (ref 19–32)
CREATININE: 0.86 mg/dL (ref 0.50–1.10)
Calcium: 9.5 mg/dL (ref 8.4–10.5)
Glucose, Bld: 86 mg/dL (ref 70–99)
PHOSPHORUS: 3 mg/dL (ref 2.3–4.6)
Potassium: 3.8 mEq/L (ref 3.5–5.3)
Sodium: 143 mEq/L (ref 135–145)

## 2013-10-21 LAB — LIPID PANEL
CHOLESTEROL: 153 mg/dL (ref 0–200)
HDL: 81 mg/dL (ref >39)
LDL CALC: 59 mg/dL (ref 0–99)
TRIGLYCERIDES: 65 mg/dL (ref <150)
Total CHOL/HDL Ratio: 1.9 Ratio
VLDL: 13 mg/dL (ref 0–40)

## 2013-10-21 LAB — CBC
HCT: 41.5 % (ref 36.0–46.0)
HEMOGLOBIN: 13.9 g/dL (ref 12.0–15.0)
MCH: 28.5 pg (ref 26.0–34.0)
MCHC: 33.5 g/dL (ref 30.0–36.0)
MCV: 85.2 fL (ref 78.0–100.0)
PLATELETS: 218 10*3/uL (ref 150–400)
RBC: 4.87 MIL/uL (ref 3.87–5.11)
RDW: 14.1 % (ref 11.5–15.5)
WBC: 5.3 10*3/uL (ref 4.0–10.5)

## 2013-10-21 LAB — HEPATIC FUNCTION PANEL
ALBUMIN: 4.2 g/dL (ref 3.5–5.2)
ALK PHOS: 62 U/L (ref 39–117)
ALT: 16 U/L (ref 0–35)
AST: 21 U/L (ref 0–37)
BILIRUBIN TOTAL: 1.1 mg/dL (ref 0.2–1.2)
Bilirubin, Direct: 0.2 mg/dL (ref 0.0–0.3)
Indirect Bilirubin: 0.9 mg/dL (ref 0.2–1.2)
TOTAL PROTEIN: 6.5 g/dL (ref 6.0–8.3)

## 2013-10-21 LAB — TSH: TSH: 1.605 u[IU]/mL (ref 0.350–4.500)

## 2013-10-21 NOTE — Assessment & Plan Note (Signed)
Tolerating statin, encouraged heart healthy diet, avoid trans fats, minimize simple carbs and saturated fats. Increase exercise as tolerated. Recheck panel today

## 2013-10-21 NOTE — Assessment & Plan Note (Signed)
Taking calcium supplement bid and was improved on recheck will check renal panel today

## 2013-10-21 NOTE — Progress Notes (Signed)
Patient ID: Jennifer Wilkins, female   DOB: [birth year], 80 y.o.   MRN: [id number] Jennifer Wilkins [id number] [birth year] 10/21/2013      Progress Note-Follow Up  Subjective  Chief Complaint  Chief Complaint  Patient presents with  . Follow-up    3 month    HPI  Patient is a 80 year old female in today for routine medical care. She is here today with her husband and overall doing well. They are living at [place]. She's had no recent illness or concerns. Is taking medications as prescribed although she did not proceed with reclast and would rather not go back to have an infusion. Denies CP/palp/SOB/HA/congestion/fevers/GI or GU c/o. Taking meds as prescribed  Past Medical History  Diagnosis Date  . Hypercholesteremia   . Arthritis   . HTN (hypertension)   . Hypocalcemia 06/19/2013    Past Surgical History  Procedure Laterality Date  . Gallbladder surgery  1984    Gallstone removal    History reviewed. No pertinent family history.  History   Social History  . Marital Status: Married    Spouse Name: N/A    Number of Children: N/A  . Years of Education: N/A   Occupational History  . Not on file.   Social History Main Topics  . Smoking status: Never Smoker   . Smokeless tobacco: Never Used  . Alcohol Use: No  . Drug Use: Not on file  . Sexual Activity: Not on file   Other Topics Concern  . Not on file   Social History Narrative  . No narrative on file    Current Outpatient Prescriptions on File Prior to Visit  Medication Sig Dispense Refill  . alendronate (FOSAMAX) 70 MG tablet Take 70 mg by mouth every 7 (seven) days. Take with a full glass of water on an empty stomach.      Marland Kitchen aspirin 81 MG tablet Take 81 mg by mouth daily.      . Calcium Carbonate-Vitamin D (CALCIUM 600/VITAMIN D) 600-400 MG-UNIT per tablet Take 1 tablet by mouth 2 (two) times daily.      . carvedilol (COREG) 12.5 MG tablet Take 1 tablet (12.5 mg total) by mouth daily.  90 tablet  1  .  carvedilol (COREG) 12.5 MG tablet TAKE 1 TABLET DAILY  90 tablet  0  . cholecalciferol (VITAMIN D) 400 UNITS TABS Take 2,000 Units by mouth daily.      . Cyanocobalamin (VITAMIN B-12 CR) 1500 MCG TBCR Take by mouth daily.      . hydrochlorothiazide (MICROZIDE) 12.5 MG capsule TAKE 1 CAPSULE DAILY  90 capsule  1  . Misc Natural Products (OSTEO BI-FLEX ADV JOINT SHIELD PO) Take by mouth daily.      . simvastatin (ZOCOR) 40 MG tablet TAKE 1 TABLET EVERY EVENING  90 tablet  0   No current facility-administered medications on file prior to visit.    No Known Allergies  Review of Systems  Review of Systems  Constitutional: Negative for fever and malaise/fatigue.  HENT: Negative for congestion.   Eyes: Negative for discharge.  Respiratory: Negative for shortness of breath.   Cardiovascular: Negative for chest pain, palpitations and leg swelling.  Gastrointestinal: Negative for nausea, abdominal pain and diarrhea.  Genitourinary: Negative for dysuria.  Musculoskeletal: Negative for falls.  Skin: Negative for rash.  Neurological: Negative for loss of consciousness and headaches.  Endo/Heme/Allergies: Negative for polydipsia.  Psychiatric/Behavioral: Negative for depression and suicidal ideas. The patient  is not nervous/anxious and does not have insomnia.     Objective  BP 118/82  Pulse 81  Temp(Src) 98.1 F (36.7 C) (Oral)  Ht 4\' 9"  (1.448 m)  Wt 111 lb 1.9 oz (50.404 kg)  BMI 24.04 kg/m2  SpO2 98%  Physical Exam  Physical Exam  Constitutional: She is oriented to person, place, and time and well-developed, well-nourished, and in no distress. No distress.  HENT:  Head: Normocephalic and atraumatic.  Eyes: Conjunctivae are normal.  Neck: Neck supple. No thyromegaly present.  Cardiovascular: Normal rate, regular rhythm and normal heart sounds.   No murmur heard. Pulmonary/Chest: Effort normal and breath sounds normal. She has no wheezes.  Abdominal: She exhibits no distension  and no mass.  Musculoskeletal: She exhibits no edema.  Lymphadenopathy:    She has no cervical adenopathy.  Neurological: She is alert and oriented to person, place, and time.  Skin: Skin is warm and dry. No rash noted. She is not diaphoretic.  Psychiatric: Memory, affect and judgment normal.    Lab Results  Component Value Date   TSH 1.534 06/07/2013   Lab Results  Component Value Date   WBC 4.8 06/07/2013   HGB 14.2 06/07/2013   HCT 42.0 06/07/2013   MCV 86.6 06/07/2013   PLT 219 06/07/2013   Lab Results  Component Value Date   CREATININE 0.87 07/14/2013   BUN 28* 07/14/2013   NA 140 07/14/2013   K 4.2 07/14/2013   CL 101 07/14/2013   CO2 27 07/14/2013   Lab Results  Component Value Date   ALT 16 06/07/2013   AST 19 06/07/2013   ALKPHOS 58 06/07/2013   BILITOT 1.1 06/07/2013   Lab Results  Component Value Date   CHOL 155 06/07/2013   Lab Results  Component Value Date   HDL 84 06/07/2013   Lab Results  Component Value Date   LDLCALC 58 06/07/2013   Lab Results  Component Value Date   TRIG 67 06/07/2013   Lab Results  Component Value Date   CHOLHDL 1.8 06/07/2013     Assessment & Plan  Hypocalcemia Taking calcium supplement bid and was improved on recheck will check renal panel today  Unspecified essential hypertension Well controlled, no changes to meds. Encouraged heart healthy diet such as the DASH diet and exercise as tolerated.   Other and unspecified hyperlipidemia Tolerating statin, encouraged heart healthy diet, avoid trans fats, minimize simple carbs and saturated fats. Increase exercise as tolerated. Recheck panel today  Osteoporosis Does not want to return for Reclast infusion, did Fosamax for roughly 2 years. Patient offered options including Prolia, Reclast and Fosamax, she agrees to think about it til next visit. Maintain exercise and ca/cium/vitamin d bid

## 2013-10-21 NOTE — Assessment & Plan Note (Signed)
Does not want to return for Reclast infusion, did Fosamax for roughly 2 years. Patient offered options including Prolia, Reclast and Fosamax, she agrees to think about it til next visit. Maintain exercise and ca/cium/vitamin d bid

## 2013-10-21 NOTE — Assessment & Plan Note (Signed)
Well controlled, no changes to meds. Encouraged heart healthy diet such as the DASH diet and exercise as tolerated.  °

## 2013-10-21 NOTE — Patient Instructions (Signed)
Restart Fosamax pills or an infusion with Reclast or Prolia which is an infusion that is twice a year or no meds   Osteoporosis Throughout your life, your body breaks down old bone and replaces it with new bone. As you get older, your body does not replace bone as quickly as it breaks it down. By the age of 71 years, most people begin to gradually lose bone because of the imbalance between bone loss and replacement. Some people lose more bone than others. Bone loss beyond a specified normal degree is considered osteoporosis.  Osteoporosis affects the strength and durability of your bones. The inside of the ends of your bones and your flat bones, like the bones of your pelvis, look like honeycomb, filled with tiny open spaces. As bone loss occurs, your bones become less dense. This means that the open spaces inside your bones become bigger and the walls between these spaces become thinner. This makes your bones weaker. Bones of a person with osteoporosis can become so weak that they can break (fracture) during minor accidents, such as a simple fall. CAUSES  The following factors have been associated with the development of osteoporosis:  Smoking.  Drinking more than 2 alcoholic drinks several days per week.  Long-term use of certain medicines:  Corticosteroids.  Chemotherapy medicines.  Thyroid medicines.  Antiepileptic medicines.  Gonadal hormone suppression medicine.  Immunosuppression medicine.  Being underweight.  Lack of physical activity.  Lack of exposure to the sun. This can lead to vitamin D deficiency.  Certain medical conditions:  Certain inflammatory bowel diseases, such as Crohn disease and ulcerative colitis.  Diabetes.  Hyperthyroidism.  Hyperparathyroidism. RISK FACTORS Anyone can develop osteoporosis. However, the following factors can increase your risk of developing osteoporosis:  Gender Women are at higher risk than men.  Age Being older than 39 years  increases your risk.  Ethnicity White and Asian people have an increased risk.  Weight Being extremely underweight can increase your risk of osteoporosis.  Family history of osteoporosis Having a family member who has developed osteoporosis can increase your risk. SYMPTOMS  Usually, people with osteoporosis have no symptoms.  DIAGNOSIS  Signs during a physical exam that may prompt your caregiver to suspect osteoporosis include:  Decreased height. This is usually caused by the compression of the bones that form your spine (vertebrae) because they have weakened and become fractured.  A curving or rounding of the upper back (kyphosis). To confirm signs of osteoporosis, your caregiver may request a procedure that uses 2 low-dose X-ray beams with different levels of energy to measure your bone mineral density (dual-energy X-ray absorptiometry [DXA]). Also, your caregiver may check your level of vitamin D. TREATMENT  The goal of osteoporosis treatment is to strengthen bones in order to decrease the risk of bone fractures. There are different types of medicines available to help achieve this goal. Some of these medicines work by slowing the processes of bone loss. Some medicines work by increasing bone density. Treatment also involves making sure that your levels of calcium and vitamin D are adequate. PREVENTION  There are things you can do to help prevent osteoporosis. Adequate intake of calcium and vitamin D can help you achieve optimal bone mineral density. Regular exercise can also help, especially resistance and weight-bearing activities. If you smoke, quitting smoking is an important part of osteoporosis prevention. MAKE SURE YOU:  Understand these instructions.  Will watch your condition.  Will get help right away if you are not doing well  or get worse. FOR MORE INFORMATION www.osteo.org and [employer].ee Document Released: 04/24/2005 Document Revised: 11/09/2012 Document Reviewed:  06/29/2011 [facility] Patient Information 2014 [place], [place].

## 2013-10-21 NOTE — Telephone Encounter (Signed)
emmi mailed to patient °

## 2013-10-21 NOTE — Progress Notes (Signed)
Pre visit review using our clinic review tool, if applicable. No additional management support is needed unless otherwise documented below in the visit note. 

## 2013-10-24 ENCOUNTER — Other Ambulatory Visit: Payer: Self-pay | Admitting: Family Medicine

## 2014-01-12 ENCOUNTER — Ambulatory Visit (INDEPENDENT_AMBULATORY_CARE_PROVIDER_SITE_OTHER): Payer: Medicare Other | Admitting: Family Medicine

## 2014-01-12 ENCOUNTER — Encounter: Payer: Self-pay | Admitting: Family Medicine

## 2014-01-12 VITALS — BP 132/74 | HR 80 | Temp 97.6°F | Ht <= 58 in | Wt 109.1 lb

## 2014-01-12 DIAGNOSIS — E785 Hyperlipidemia, unspecified: Secondary | ICD-10-CM | POA: Diagnosis not present

## 2014-01-12 DIAGNOSIS — I10 Essential (primary) hypertension: Secondary | ICD-10-CM

## 2014-01-12 DIAGNOSIS — M810 Age-related osteoporosis without current pathological fracture: Secondary | ICD-10-CM

## 2014-01-12 DIAGNOSIS — H9190 Unspecified hearing loss, unspecified ear: Secondary | ICD-10-CM

## 2014-01-12 NOTE — Progress Notes (Signed)
Pre visit review using our clinic review tool, if applicable. No additional management support is needed unless otherwise documented below in the visit note. 

## 2014-01-12 NOTE — Patient Instructions (Signed)
Hypertension Hypertension, commonly called high blood pressure, is when the force of blood pumping through your arteries is too strong. Your arteries are the blood vessels that carry blood from your heart throughout your body. A blood pressure reading consists of a higher number over a lower number, such as 110/72. The higher number (systolic) is the pressure inside your arteries when your heart pumps. The lower number (diastolic) is the pressure inside your arteries when your heart relaxes. Ideally you want your blood pressure below 120/80. Hypertension forces your heart to work harder to pump blood. Your arteries may become narrow or stiff. Having hypertension puts you at risk for heart disease, stroke, and other problems.  RISK FACTORS Some risk factors for high blood pressure are controllable. Others are not.  Risk factors you cannot control include:   Race. You may be at higher risk if you are African American.  Age. Risk increases with age.  Gender. Men are at higher risk than women before age 45 years. After age 65, women are at higher risk than men. Risk factors you can control include:  Not getting enough exercise or physical activity.  Being overweight.  Getting too much fat, sugar, calories, or salt in your diet.  Drinking too much alcohol. SIGNS AND SYMPTOMS Hypertension does not usually cause signs or symptoms. Extremely high blood pressure (hypertensive crisis) may cause headache, anxiety, shortness of breath, and nosebleed. DIAGNOSIS  To check if you have hypertension, your health care provider will measure your blood pressure while you are seated, with your arm held at the level of your heart. It should be measured at least twice using the same arm. Certain conditions can cause a difference in blood pressure between your right and left arms. A blood pressure reading that is higher than normal on one occasion does not mean that you need treatment. If one blood pressure reading  is high, ask your health care provider about having it checked again. TREATMENT  Treating high blood pressure includes making lifestyle changes and possibly taking medication. Living a healthy lifestyle can help lower high blood pressure. You may need to change some of your habits. Lifestyle changes may include:  Following the DASH diet. This diet is high in fruits, vegetables, and whole grains. It is low in salt, red meat, and added sugars.  Getting at least 2 1/2 hours of brisk physical activity every week.  Losing weight if necessary.  Not smoking.  Limiting alcoholic beverages.  Learning ways to reduce stress. If lifestyle changes are not enough to get your blood pressure under control, your health care provider may prescribe medicine. You may need to take more than one. Work closely with your health care provider to understand the risks and benefits. HOME CARE INSTRUCTIONS  Have your blood pressure rechecked as directed by your health care provider.   Only take medicine as directed by your health care provider. Follow the directions carefully. Blood pressure medicines must be taken as prescribed. The medicine does not work as well when you skip doses. Skipping doses also puts you at risk for problems.   Do not smoke.   Monitor your blood pressure at home as directed by your health care provider. SEEK MEDICAL CARE IF:   You think you are having a reaction to medicines taken.  You have recurrent headaches or feel dizzy.  You have swelling in your ankles.  You have trouble with your vision. SEEK IMMEDIATE MEDICAL CARE IF:  You develop a severe headache or   confusion.  You have unusual weakness, numbness, or feel faint.  You have severe chest or abdominal pain.  You vomit repeatedly.  You have trouble breathing. MAKE SURE YOU:   Understand these instructions.  Will watch your condition.  Will get help right away if you are not doing well or get  worse. Document Released: 07/15/2005 Document Revised: 07/20/2013 Document Reviewed: 05/07/2013 [employer] Patient Information 2015 [employer]. This information is not intended to replace advice given to you by your health care provider. Make sure you discuss any questions you have with your health care provider.  

## 2014-01-20 ENCOUNTER — Other Ambulatory Visit: Payer: Self-pay | Admitting: Family Medicine

## 2014-01-21 ENCOUNTER — Encounter: Payer: Self-pay | Admitting: Family Medicine

## 2014-01-21 DIAGNOSIS — H9190 Unspecified hearing loss, unspecified ear: Secondary | ICD-10-CM

## 2014-01-21 HISTORY — DX: Unspecified hearing loss, unspecified ear: H91.90

## 2014-01-21 NOTE — Assessment & Plan Note (Signed)
Well controlled, no changes to meds. Encouraged heart healthy diet such as the DASH diet and exercise as tolerated.  °

## 2014-01-21 NOTE — Progress Notes (Signed)
Patient ID: Jennifer Wilkins, female   DOB: 25-May-1932, 80 y.o.   MRN: [id number] LEALER MARSLAND [id number] [birth year] 01/21/2014      Progress Note-Follow Up  Subjective  Chief Complaint  Chief Complaint  Patient presents with  . Follow-up    3 month    HPI  Patient is a 80 year old female in today for routine medical care. Patient is in today for followup. Feeling well. No recent illness. Tolerated Reclast infusions in the past. Tries to stay active. Denies CP/palp/SOB/HA/congestion/fevers/GI or GU c/o. Taking meds as prescribed  Past Medical History  Diagnosis Date  . Hypercholesteremia   . Arthritis   . HTN (hypertension)   . Hypocalcemia 06/19/2013    Past Surgical History  Procedure Laterality Date  . Gallbladder surgery  1984    Gallstone removal    History reviewed. No pertinent family history.  History   Social History  . Marital Status: Married    Spouse Name: N/A    Number of Children: N/A  . Years of Education: N/A   Occupational History  . Not on file.   Social History Main Topics  . Smoking status: Never Smoker   . Smokeless tobacco: Never Used  . Alcohol Use: No  . Drug Use: Not on file  . Sexual Activity: Not on file   Other Topics Concern  . Not on file   Social History Narrative  . No narrative on file    Current Outpatient Prescriptions on File Prior to Visit  Medication Sig Dispense Refill  . aspirin 81 MG tablet Take 81 mg by mouth daily.      . Calcium Carbonate-Vitamin D (CALCIUM 600/VITAMIN D) 600-400 MG-UNIT per tablet Take 1 tablet by mouth 2 (two) times daily.      . cholecalciferol (VITAMIN D) 400 UNITS TABS Take 5,000 Units by mouth daily.       . Cyanocobalamin (VITAMIN B-12 CR) 1500 MCG TBCR Take by mouth daily.      . hydrochlorothiazide (MICROZIDE) 12.5 MG capsule TAKE 1 CAPSULE DAILY  90 capsule  1  . Misc Natural Products (OSTEO BI-FLEX ADV JOINT SHIELD PO) Take by mouth daily.      . simvastatin (ZOCOR) 40 MG tablet TAKE 1  TABLET EVERY EVENING  90 tablet  1  . alendronate (FOSAMAX) 70 MG tablet Take 70 mg by mouth every 7 (seven) days. Take with a full glass of water on an empty stomach.       No current facility-administered medications on file prior to visit.    No Known Allergies  Review of Systems  Review of Systems  Constitutional: Negative for fever and malaise/fatigue.  HENT: Positive for hearing loss. Negative for congestion.   Eyes: Negative for discharge.  Respiratory: Negative for shortness of breath.   Cardiovascular: Negative for chest pain, palpitations and leg swelling.  Gastrointestinal: Negative for nausea, abdominal pain and diarrhea.  Genitourinary: Negative for dysuria.  Musculoskeletal: Negative for falls.  Skin: Negative for rash.  Neurological: Negative for loss of consciousness and headaches.  Endo/Heme/Allergies: Negative for polydipsia.  Psychiatric/Behavioral: Negative for depression and suicidal ideas. The patient is not nervous/anxious and does not have insomnia.     Objective  BP 132/74  Pulse 80  Temp(Src) 97.6 F (36.4 C) (Oral)  Ht 4\' 9"  (1.448 m)  Wt 109 lb 1.9 oz (49.497 kg)  BMI 23.61 kg/m2  SpO2 96%  Physical Exam  Physical Exam  Constitutional: She is oriented to person,  place, and time and well-developed, well-nourished, and in no distress. No distress.  HENT:  Head: Normocephalic and atraumatic.  Eyes: Conjunctivae are normal.  Neck: Neck supple. No thyromegaly present.  Cardiovascular: Normal rate, regular rhythm and normal heart sounds.   No murmur heard. Pulmonary/Chest: Effort normal and breath sounds normal. She has no wheezes.  Abdominal: She exhibits no distension and no mass.  Musculoskeletal: She exhibits no edema.  Lymphadenopathy:    She has no cervical adenopathy.  Neurological: She is alert and oriented to person, place, and time.  Skin: Skin is warm and dry. No rash noted. She is not diaphoretic.  Psychiatric: Memory, affect and  judgment normal.    Lab Results  Component Value Date   TSH 1.605 10/21/2013   Lab Results  Component Value Date   WBC 5.3 10/21/2013   HGB 13.9 10/21/2013   HCT 41.5 10/21/2013   MCV 85.2 10/21/2013   PLT 218 10/21/2013   Lab Results  Component Value Date   CREATININE 0.86 10/21/2013   BUN 23 10/21/2013   NA 143 10/21/2013   K 3.8 10/21/2013   CL 102 10/21/2013   CO2 30 10/21/2013   Lab Results  Component Value Date   ALT 16 10/21/2013   AST 21 10/21/2013   ALKPHOS 62 10/21/2013   BILITOT 1.1 10/21/2013   Lab Results  Component Value Date   CHOL 153 10/21/2013   Lab Results  Component Value Date   HDL 81 10/21/2013   Lab Results  Component Value Date   LDLCALC 59 10/21/2013   Lab Results  Component Value Date   TRIG 65 10/21/2013   Lab Results  Component Value Date   CHOLHDL 1.9 10/21/2013     Assessment & Plan  Unspecified essential hypertension Well controlled, no changes to meds. Encouraged heart healthy diet such as the DASH diet and exercise as tolerated.   Other and unspecified hyperlipidemia Tolerating statin, encouraged heart healthy diet, avoid trans fats, minimize simple carbs and saturated fats. Increase exercise as tolerated  Hearing loss Referred to audiology for further consideration  Osteoporosis Tolerating Fosamax

## 2014-01-21 NOTE — Assessment & Plan Note (Signed)
Tolerating Fosamax  

## 2014-01-21 NOTE — Assessment & Plan Note (Signed)
Referred to audiology for further consideration. 

## 2014-01-21 NOTE — Assessment & Plan Note (Signed)
Tolerating statin, encouraged heart healthy diet, avoid trans fats, minimize simple carbs and saturated fats. Increase exercise as tolerated 

## 2014-01-31 ENCOUNTER — Other Ambulatory Visit: Payer: Self-pay | Admitting: Family Medicine

## 2014-02-04 ENCOUNTER — Ambulatory Visit: Payer: Medicare Other | Admitting: Family Medicine

## 2014-02-11 DIAGNOSIS — H903 Sensorineural hearing loss, bilateral: Secondary | ICD-10-CM | POA: Diagnosis not present

## 2014-03-14 ENCOUNTER — Encounter: Payer: Self-pay | Admitting: Family Medicine

## 2014-03-17 ENCOUNTER — Other Ambulatory Visit: Payer: Self-pay | Admitting: Family Medicine

## 2014-04-02 ENCOUNTER — Other Ambulatory Visit: Payer: Self-pay | Admitting: Family Medicine

## 2014-04-07 ENCOUNTER — Other Ambulatory Visit (INDEPENDENT_AMBULATORY_CARE_PROVIDER_SITE_OTHER): Payer: Medicare Other

## 2014-04-07 DIAGNOSIS — E785 Hyperlipidemia, unspecified: Secondary | ICD-10-CM | POA: Diagnosis not present

## 2014-04-07 DIAGNOSIS — I10 Essential (primary) hypertension: Secondary | ICD-10-CM | POA: Diagnosis not present

## 2014-04-07 LAB — CBC WITH DIFFERENTIAL/PLATELET
BASOS PCT: 0.5 % (ref 0.0–3.0)
Basophils Absolute: 0 10*3/uL (ref 0.0–0.1)
EOS PCT: 1.8 % (ref 0.0–5.0)
Eosinophils Absolute: 0.1 10*3/uL (ref 0.0–0.7)
HEMATOCRIT: 41.4 % (ref 36.0–46.0)
HEMOGLOBIN: 13.8 g/dL (ref 12.0–15.0)
LYMPHS ABS: 1.7 10*3/uL (ref 0.7–4.0)
Lymphocytes Relative: 35.9 % (ref 12.0–46.0)
MCHC: 33.4 g/dL (ref 30.0–36.0)
MCV: 86.8 fl (ref 78.0–100.0)
MONO ABS: 0.3 10*3/uL (ref 0.1–1.0)
Monocytes Relative: 7.2 % (ref 3.0–12.0)
NEUTROS ABS: 2.6 10*3/uL (ref 1.4–7.7)
NEUTROS PCT: 54.6 % (ref 43.0–77.0)
Platelets: 199 10*3/uL (ref 150.0–400.0)
RBC: 4.77 Mil/uL (ref 3.87–5.11)
RDW: 14.5 % (ref 11.5–15.5)
WBC: 4.8 10*3/uL (ref 4.0–10.5)

## 2014-04-07 LAB — HEPATIC FUNCTION PANEL
ALT: 17 U/L (ref 0–35)
AST: 18 U/L (ref 0–37)
Albumin: 3.7 g/dL (ref 3.5–5.2)
Alkaline Phosphatase: 70 U/L (ref 39–117)
BILIRUBIN TOTAL: 0.8 mg/dL (ref 0.2–1.2)
Bilirubin, Direct: 0.1 mg/dL (ref 0.0–0.3)
TOTAL PROTEIN: 6.9 g/dL (ref 6.0–8.3)

## 2014-04-07 LAB — LIPID PANEL
CHOLESTEROL: 168 mg/dL (ref 0–200)
HDL: 84.3 mg/dL (ref >39.00)
LDL Cholesterol: 73 mg/dL (ref 0–99)
NonHDL: 83.7
TRIGLYCERIDES: 55 mg/dL (ref 0.0–149.0)
Total CHOL/HDL Ratio: 2
VLDL: 11 mg/dL (ref 0.0–40.0)

## 2014-04-07 LAB — RENAL FUNCTION PANEL
ALBUMIN: 3.7 g/dL (ref 3.5–5.2)
BUN: 21 mg/dL (ref 6–23)
CALCIUM: 9.3 mg/dL (ref 8.4–10.5)
CHLORIDE: 106 meq/L (ref 96–112)
CO2: 27 meq/L (ref 19–32)
Creatinine, Ser: 0.8 mg/dL (ref 0.4–1.2)
GFR: 76.53 mL/min (ref >60.00)
Glucose, Bld: 91 mg/dL (ref 70–99)
POTASSIUM: 3.8 meq/L (ref 3.5–5.1)
Phosphorus: 3.1 mg/dL (ref 2.3–4.6)
SODIUM: 139 meq/L (ref 135–145)

## 2014-04-07 LAB — TSH: TSH: 1.57 u[IU]/mL (ref 0.35–4.50)

## 2014-04-21 ENCOUNTER — Ambulatory Visit (INDEPENDENT_AMBULATORY_CARE_PROVIDER_SITE_OTHER): Payer: Medicare Other | Admitting: Family Medicine

## 2014-04-21 ENCOUNTER — Encounter: Payer: Self-pay | Admitting: Family Medicine

## 2014-04-21 VITALS — BP 153/85 | HR 86 | Temp 98.0°F | Ht <= 58 in | Wt 112.6 lb

## 2014-04-21 DIAGNOSIS — I10 Essential (primary) hypertension: Secondary | ICD-10-CM

## 2014-04-21 DIAGNOSIS — M810 Age-related osteoporosis without current pathological fracture: Secondary | ICD-10-CM

## 2014-04-21 DIAGNOSIS — E785 Hyperlipidemia, unspecified: Secondary | ICD-10-CM

## 2014-04-21 DIAGNOSIS — Z23 Encounter for immunization: Secondary | ICD-10-CM | POA: Diagnosis not present

## 2014-04-21 DIAGNOSIS — R030 Elevated blood-pressure reading, without diagnosis of hypertension: Secondary | ICD-10-CM

## 2014-04-21 DIAGNOSIS — E559 Vitamin D deficiency, unspecified: Secondary | ICD-10-CM

## 2014-04-21 DIAGNOSIS — IMO0001 Reserved for inherently not codable concepts without codable children: Secondary | ICD-10-CM

## 2014-04-21 HISTORY — DX: Reserved for inherently not codable concepts without codable children: IMO0001

## 2014-04-21 NOTE — Assessment & Plan Note (Signed)
Continue supplements and monitor 

## 2014-04-21 NOTE — Progress Notes (Signed)
Pre visit review using our clinic review tool, if applicable. No additional management support is needed unless otherwise documented below in the visit note. 

## 2014-04-21 NOTE — Assessment & Plan Note (Signed)
Has been off bisphosphanates for roughly a year now, will repeat Dexa scan next month and then consider course of Prolia after that.

## 2014-04-21 NOTE — Assessment & Plan Note (Signed)
Tolerating statin, encouraged heart healthy diet, avoid trans fats, minimize simple carbs and saturated fats. Increase exercise as tolerated 

## 2014-04-21 NOTE — Assessment & Plan Note (Signed)
resolved 

## 2014-04-21 NOTE — Assessment & Plan Note (Signed)
Well controlled, no changes to meds. Encouraged heart healthy diet such as the DASH diet and exercise as tolerated. Improved on recheck 

## 2014-04-21 NOTE — Progress Notes (Signed)
Patient ID: Jennifer Wilkins, female   DOB: 22-Jun-1932, 80 y.o.   MRN: [id number] Jennifer Wilkins [id number] [birth year] 04/21/2014      Progress Note-Follow Up  Subjective  Chief Complaint  Chief Complaint  Patient presents with  . Follow-up    3 month  . Injections    flu    HPI  Patient is a 80 year old female in today for routine medical care. Doing very well. Here today with her daughter and husband. No recent illness or concerns. Denies CP/palp/SOB/HA/congestion/fevers/GI or GU c/o. Taking meds as prescribed  Past Medical History  Diagnosis Date  . Hypercholesteremia   . Arthritis   . HTN (hypertension)   . Hypocalcemia 06/19/2013  . Elevated BP 04/21/2014    Past Surgical History  Procedure Laterality Date  . Gallbladder surgery  1984    Gallstone removal    History reviewed. No pertinent family history.  History   Social History  . Marital Status: Married    Spouse Name: N/A    Number of Children: N/A  . Years of Education: N/A   Occupational History  . Not on file.   Social History Main Topics  . Smoking status: Never Smoker   . Smokeless tobacco: Never Used  . Alcohol Use: No  . Drug Use: Not on file  . Sexual Activity: Not on file   Other Topics Concern  . Not on file   Social History Narrative  . No narrative on file    Current Outpatient Prescriptions on File Prior to Visit  Medication Sig Dispense Refill  . alendronate (FOSAMAX) 70 MG tablet Take 70 mg by mouth every 7 (seven) days. Take with a full glass of water on an empty stomach.      Marland Kitchen aspirin 81 MG tablet Take 81 mg by mouth daily.      . Calcium Carbonate-Vitamin D (CALCIUM 600/VITAMIN D) 600-400 MG-UNIT per tablet Take 1 tablet by mouth 2 (two) times daily.      . carvedilol (COREG) 12.5 MG tablet TAKE 1 TABLET DAILY  90 tablet  1  . cholecalciferol (VITAMIN D) 400 UNITS TABS Take 5,000 Units by mouth daily.       . Cyanocobalamin (VITAMIN B-12 CR) 1500 MCG TBCR Take by mouth daily.      .  hydrochlorothiazide (MICROZIDE) 12.5 MG capsule TAKE 1 CAPSULE DAILY  90 capsule  0  . Misc Natural Products (OSTEO BI-FLEX ADV JOINT SHIELD PO) Take by mouth daily.      . simvastatin (ZOCOR) 40 MG tablet TAKE 1 TABLET EVERY EVENING  90 tablet  3   No current facility-administered medications on file prior to visit.    No Known Allergies  Review of Systems  Review of Systems  Constitutional: Negative for fever and malaise/fatigue.  HENT: Negative for congestion.   Eyes: Negative for discharge.  Respiratory: Negative for shortness of breath.   Cardiovascular: Negative for chest pain, palpitations and leg swelling.  Gastrointestinal: Negative for nausea, abdominal pain and diarrhea.  Genitourinary: Negative for dysuria.  Musculoskeletal: Negative for falls.  Skin: Negative for rash.  Neurological: Negative for loss of consciousness and headaches.  Endo/Heme/Allergies: Negative for polydipsia.  Psychiatric/Behavioral: Negative for depression and suicidal ideas. The patient is not nervous/anxious and does not have insomnia.     Objective  BP 153/85  Pulse 86  Temp(Src) 98 F (36.7 C) (Oral)  Ht 4\' 9"  (1.448 m)  Wt 112 lb 9.6 oz (51.075 kg)  BMI  24.36 kg/m2  SpO2 100%  Physical Exam  Physical Exam  Constitutional: She is oriented to person, place, and time and well-developed, well-nourished, and in no distress. No distress.  HENT:  Head: Normocephalic and atraumatic.  Eyes: Conjunctivae are normal.  Neck: Neck supple. No thyromegaly present.  Cardiovascular: Normal rate, regular rhythm and normal heart sounds.   No murmur heard. Pulmonary/Chest: Effort normal and breath sounds normal. She has no wheezes.  Abdominal: She exhibits no distension and no mass.  Musculoskeletal: She exhibits no edema.  Lymphadenopathy:    She has no cervical adenopathy.  Neurological: She is alert and oriented to person, place, and time.  Skin: Skin is warm and dry. No rash noted. She is  not diaphoretic.  Psychiatric: Memory, affect and judgment normal.    Lab Results  Component Value Date   TSH 1.57 04/07/2014   Lab Results  Component Value Date   WBC 4.8 04/07/2014   HGB 13.8 04/07/2014   HCT 41.4 04/07/2014   MCV 86.8 04/07/2014   PLT 199.0 04/07/2014   Lab Results  Component Value Date   CREATININE 0.8 04/07/2014   BUN 21 04/07/2014   NA 139 04/07/2014   K 3.8 04/07/2014   CL 106 04/07/2014   CO2 27 04/07/2014   Lab Results  Component Value Date   ALT 17 04/07/2014   AST 18 04/07/2014   ALKPHOS 70 04/07/2014   BILITOT 0.8 04/07/2014   Lab Results  Component Value Date   CHOL 168 04/07/2014   Lab Results  Component Value Date   HDL 84.30 04/07/2014   Lab Results  Component Value Date   LDLCALC 73 04/07/2014   Lab Results  Component Value Date   TRIG 55.0 04/07/2014   Lab Results  Component Value Date   CHOLHDL 2 04/07/2014     Assessment & Plan  Hypocalcemia resolved  Osteoporosis Has been off bisphosphanates for roughly a year now, will repeat Dexa scan next month and then consider course of Prolia after that.  Unspecified essential hypertension Well controlled, no changes to meds. Encouraged heart healthy diet such as the DASH diet and exercise as tolerated. Improved on recheck  Vitamin D deficiency Continue supplements and monitor  Other and unspecified hyperlipidemia Tolerating statin, encouraged heart healthy diet, avoid trans fats, minimize simple carbs and saturated fats. Increase exercise as tolerated

## 2014-04-28 ENCOUNTER — Ambulatory Visit (INDEPENDENT_AMBULATORY_CARE_PROVIDER_SITE_OTHER): Payer: Medicare Other

## 2014-04-28 DIAGNOSIS — M810 Age-related osteoporosis without current pathological fracture: Secondary | ICD-10-CM

## 2014-05-18 ENCOUNTER — Telehealth: Payer: Self-pay | Admitting: Family Medicine

## 2014-05-18 NOTE — Telephone Encounter (Signed)
Please inform pt of message below. And schedule a nurse visit for Prolia injection if patient would like to proceed.  Thanks

## 2014-05-18 NOTE — Telephone Encounter (Signed)
Informed patient of this and she wants to know if there is anything else that she could take?

## 2014-05-18 NOTE — Telephone Encounter (Signed)
I have rec'd pt's insurance verification for Prolia.  Pt has an estimated responsibility of $0 for her injection. Please make her aware this is an estimate and we won't know an exact amt until both of her insurances have paid. I have sent a copy of the summary of benefits to be scanned into her chart. If you have any questions, please let me know. Thank you.

## 2014-05-19 NOTE — Telephone Encounter (Signed)
She declined Reclast, took oral bisphosphanates for years so would likely benefit from a new type of med. There is nothing else like Prolia. If she is unwilling to come in we could try and see if her insurance will pay for Evista but it is an expensive pill every day and does not work as well

## 2014-05-19 NOTE — Telephone Encounter (Signed)
Tried to call patient but phone just rings. Will try again later

## 2014-05-19 NOTE — Telephone Encounter (Signed)
Please advise 

## 2014-05-20 NOTE — Telephone Encounter (Signed)
Pt states she would like to do the prolia injection. I tried to transfer to front desk to [place] but couldn't reach anyone. Pt will call back to schedule a prolia injection. A Staff message was sent to [facility] to order a prolia injection

## 2014-05-20 NOTE — Telephone Encounter (Signed)
Patient returned phone call. Best # [phone]. Patient states that she will be home for another hour. Should be back home after 2:30

## 2014-05-30 ENCOUNTER — Ambulatory Visit (INDEPENDENT_AMBULATORY_CARE_PROVIDER_SITE_OTHER): Payer: Medicare Other

## 2014-05-30 DIAGNOSIS — M810 Age-related osteoporosis without current pathological fracture: Secondary | ICD-10-CM

## 2014-05-30 MED ORDER — DENOSUMAB 60 MG/ML ~~LOC~~ SOLN
60.0000 mg | Freq: Once | SUBCUTANEOUS | Status: AC
  Administered 2014-05-30: 60 mg via SUBCUTANEOUS

## 2014-05-30 NOTE — Progress Notes (Signed)
Pt tolerated injection well.  No signs of reaction upon leaving the clinic.

## 2014-05-30 NOTE — Progress Notes (Signed)
Pre visit review using our clinic review tool, if applicable. No additional management support is needed unless otherwise documented below in the visit note. 

## 2014-06-16 ENCOUNTER — Other Ambulatory Visit: Payer: Self-pay | Admitting: Family Medicine

## 2014-08-27 ENCOUNTER — Other Ambulatory Visit: Payer: Self-pay | Admitting: Family Medicine

## 2014-08-29 NOTE — Telephone Encounter (Signed)
Rx's sent to the pharmacy by e-script.//AB/CMA 

## 2014-10-14 ENCOUNTER — Encounter: Payer: Self-pay | Admitting: Family Medicine

## 2014-10-14 ENCOUNTER — Ambulatory Visit (INDEPENDENT_AMBULATORY_CARE_PROVIDER_SITE_OTHER): Payer: Medicare Other | Admitting: Family Medicine

## 2014-10-14 VITALS — BP 138/84 | HR 83 | Temp 97.5°F | Ht <= 58 in | Wt 113.5 lb

## 2014-10-14 DIAGNOSIS — M810 Age-related osteoporosis without current pathological fracture: Secondary | ICD-10-CM

## 2014-10-14 DIAGNOSIS — Z23 Encounter for immunization: Secondary | ICD-10-CM | POA: Diagnosis not present

## 2014-10-14 DIAGNOSIS — I10 Essential (primary) hypertension: Secondary | ICD-10-CM

## 2014-10-14 DIAGNOSIS — E782 Mixed hyperlipidemia: Secondary | ICD-10-CM

## 2014-10-14 LAB — LIPID PANEL
Cholesterol: 177 mg/dL (ref 0–200)
HDL: 99 mg/dL (ref >=46)
LDL Cholesterol: 61 mg/dL (ref 0–99)
Total CHOL/HDL Ratio: 1.8 Ratio
Triglycerides: 83 mg/dL (ref <150)
VLDL: 17 mg/dL (ref 0–40)

## 2014-10-14 LAB — COMPREHENSIVE METABOLIC PANEL
ALT: 17 U/L (ref 0–35)
AST: 19 U/L (ref 0–37)
Albumin: 4.5 g/dL (ref 3.5–5.2)
Alkaline Phosphatase: 62 U/L (ref 39–117)
BUN: 20 mg/dL (ref 6–23)
CHLORIDE: 102 meq/L (ref 96–112)
CO2: 27 mEq/L (ref 19–32)
Calcium: 10.2 mg/dL (ref 8.4–10.5)
Creat: 0.8 mg/dL (ref 0.50–1.10)
Glucose, Bld: 90 mg/dL (ref 70–99)
Potassium: 4.2 mEq/L (ref 3.5–5.3)
Sodium: 141 mEq/L (ref 135–145)
TOTAL PROTEIN: 7.2 g/dL (ref 6.0–8.3)
Total Bilirubin: 1 mg/dL (ref 0.2–1.2)

## 2014-10-14 MED ORDER — ZOSTER VACCINE LIVE 19400 UNT/0.65ML ~~LOC~~ SOLR: 0.6500 mL | Freq: Once | SUBCUTANEOUS | Status: DC

## 2014-10-14 NOTE — Progress Notes (Signed)
Pre visit review using our clinic review tool, if applicable. No additional management support is needed unless otherwise documented below in the visit note. 

## 2014-10-14 NOTE — Patient Instructions (Signed)

## 2014-10-20 ENCOUNTER — Ambulatory Visit: Payer: Medicare Other | Admitting: Family Medicine

## 2014-10-27 ENCOUNTER — Encounter: Payer: Self-pay | Admitting: Family Medicine

## 2014-10-27 NOTE — Progress Notes (Signed)
Jennifer Wilkins  [id number] [birth year] 10/27/2014      Progress Note-Follow Up  Subjective  Chief Complaint  Chief Complaint  Patient presents with  . Follow-up    HPI  Patient is a 81 y.o. female in today for routine medical care. Patient is in today for follow-up accompanied by her daughter and her husband. She is doing well. No recent illness. Stays very active and eats a heart healthy diet. Denies CP/palp/SOB/HA/congestion/fevers/GI or GU c/o. Taking meds as prescribed  Past Medical History  Diagnosis Date  . Hypercholesteremia   . Arthritis   . HTN (hypertension)   . Hypocalcemia 06/19/2013  . Elevated BP 04/21/2014    Past Surgical History  Procedure Laterality Date  . Gallbladder surgery  1984    Gallstone removal    History reviewed. No pertinent family history.  History   Social History  . Marital Status: Married    Spouse Name: N/A  . Number of Children: N/A  . Years of Education: N/A   Occupational History  . Not on file.   Social History Main Topics  . Smoking status: Never Smoker   . Smokeless tobacco: Never Used  . Alcohol Use: No  . Drug Use: Not on file  . Sexual Activity: Not on file   Other Topics Concern  . Not on file   Social History Narrative    Current Outpatient Prescriptions on File Prior to Visit  Medication Sig Dispense Refill  . aspirin 81 MG tablet Take 81 mg by mouth daily.    . Calcium Carbonate-Vitamin D (CALCIUM 600/VITAMIN D) 600-400 MG-UNIT per tablet Take 1 tablet by mouth 2 (two) times daily.    . carvedilol (COREG) 12.5 MG tablet TAKE 1 TABLET DAILY 90 tablet 1  . cholecalciferol (VITAMIN D) 400 UNITS TABS Take 5,000 Units by mouth daily.     . Cyanocobalamin (VITAMIN B-12 CR) 1500 MCG TBCR Take by mouth daily.    . hydrochlorothiazide (MICROZIDE) 12.5 MG capsule TAKE 1 CAPSULE DAILY 90 capsule 1  . Misc Natural Products (OSTEO BI-FLEX ADV JOINT SHIELD PO) Take by mouth daily.    . simvastatin (ZOCOR) 40 MG tablet  TAKE 1 TABLET EVERY EVENING 90 tablet 3   No current facility-administered medications on file prior to visit.    No Known Allergies  Review of Systems  Review of Systems  Constitutional: Negative for fever and malaise/fatigue.  HENT: Negative for congestion.   Eyes: Negative for discharge.  Respiratory: Negative for shortness of breath.   Cardiovascular: Negative for chest pain, palpitations and leg swelling.  Gastrointestinal: Negative for nausea, abdominal pain and diarrhea.  Genitourinary: Negative for dysuria.  Musculoskeletal: Negative for falls.  Skin: Negative for rash.  Neurological: Negative for loss of consciousness and headaches.  Endo/Heme/Allergies: Negative for polydipsia.  Psychiatric/Behavioral: Negative for depression and suicidal ideas. The patient is not nervous/anxious and does not have insomnia.     Objective  BP 138/84 mmHg  Pulse 83  Temp(Src) 97.5 F (36.4 C) (Oral)  Ht 4\' 9"  (1.448 m)  Wt 113 lb 8 oz (51.483 kg)  BMI 24.55 kg/m2  SpO2 99%  Physical Exam  Physical Exam  Constitutional: She is oriented to person, place, and time and well-developed, well-nourished, and in no distress. No distress.  HENT:  Head: Normocephalic and atraumatic.  Eyes: Conjunctivae are normal.  Neck: Neck supple. No thyromegaly present.  Cardiovascular: Normal rate, regular rhythm and normal heart sounds.   No murmur heard. Pulmonary/Chest: Effort  normal and breath sounds normal. She has no wheezes.  Abdominal: She exhibits no distension and no mass.  Musculoskeletal: She exhibits no edema.  Lymphadenopathy:    She has no cervical adenopathy.  Neurological: She is alert and oriented to person, place, and time.  Skin: Skin is warm and dry. No rash noted. She is not diaphoretic.  Psychiatric: Memory, affect and judgment normal.    Lab Results  Component Value Date   TSH 1.57 04/07/2014   Lab Results  Component Value Date   WBC 4.8 04/07/2014   HGB 13.8  04/07/2014   HCT 41.4 04/07/2014   MCV 86.8 04/07/2014   PLT 199.0 04/07/2014   Lab Results  Component Value Date   CREATININE 0.80 10/14/2014   BUN 20 10/14/2014   NA 141 10/14/2014   K 4.2 10/14/2014   CL 102 10/14/2014   CO2 27 10/14/2014   Lab Results  Component Value Date   ALT 17 10/14/2014   AST 19 10/14/2014   ALKPHOS 62 10/14/2014   BILITOT 1.0 10/14/2014   Lab Results  Component Value Date   CHOL 177 10/14/2014   Lab Results  Component Value Date   HDL 99 10/14/2014   Lab Results  Component Value Date   LDLCALC 61 10/14/2014   Lab Results  Component Value Date   TRIG 83 10/14/2014   Lab Results  Component Value Date   CHOLHDL 1.8 10/14/2014     Assessment & Plan  Essential hypertension Well controlled, no changes to meds. Encouraged heart healthy diet such as the DASH diet and exercise as tolerated.    Hyperlipidemia, mixed Tolerating statin, encouraged heart healthy diet, avoid trans fats, minimize simple carbs and saturated fats. Increase exercise as tolerated   Osteoporosis Stays very active. No new meds at this time.

## 2014-10-27 NOTE — Assessment & Plan Note (Signed)
Tolerating statin, encouraged heart healthy diet, avoid trans fats, minimize simple carbs and saturated fats. Increase exercise as tolerated 

## 2014-10-27 NOTE — Assessment & Plan Note (Signed)
Well controlled, no changes to meds. Encouraged heart healthy diet such as the DASH diet and exercise as tolerated.  °

## 2014-10-27 NOTE — Assessment & Plan Note (Signed)
Stays very active. No new meds at this time.

## 2014-11-11 IMAGING — DX DG DXA BONE DENSITY STUDY HL7
4 series · 4 of 4 positions shown · non-contrast
Comparison: Bone mineral density lumbar spine has decreased by 2.3%
compared to previous study April 2012.

CLINICAL DATA: History of osteoporosis. The patient reports
discontinuing bisphosphonates approximate 1 year ago.

EXAM:
DUAL X-RAY ABSORPTIOMETRY (DXA) FOR BONE MINERAL DENSITY

[view not recorded (1 of 4)]
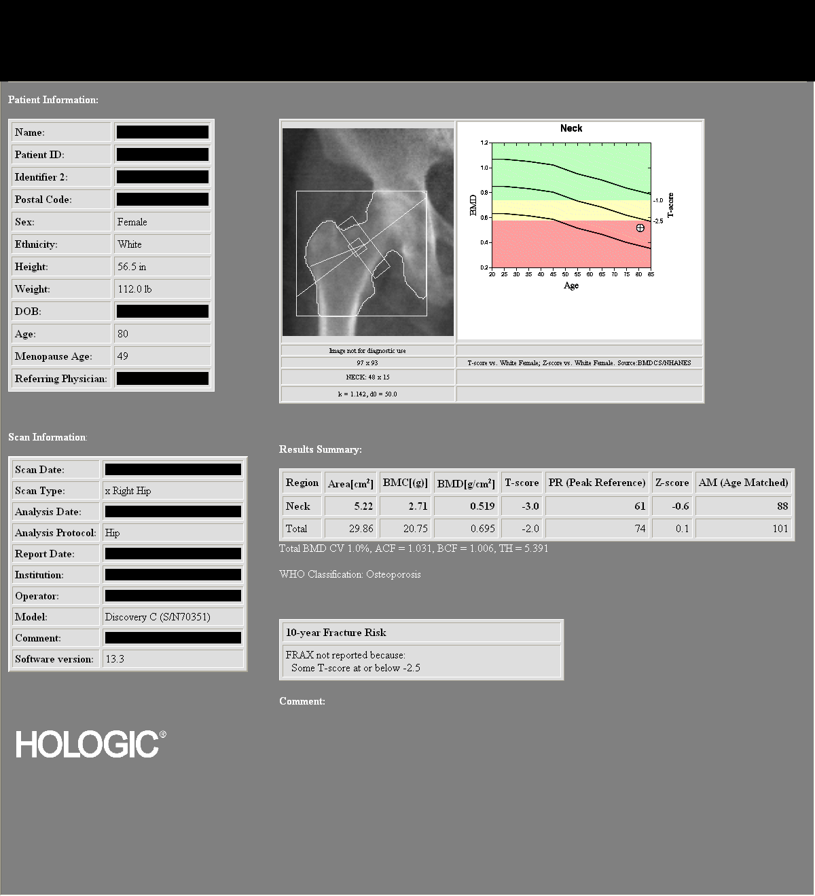

[view not recorded (2 of 4)]
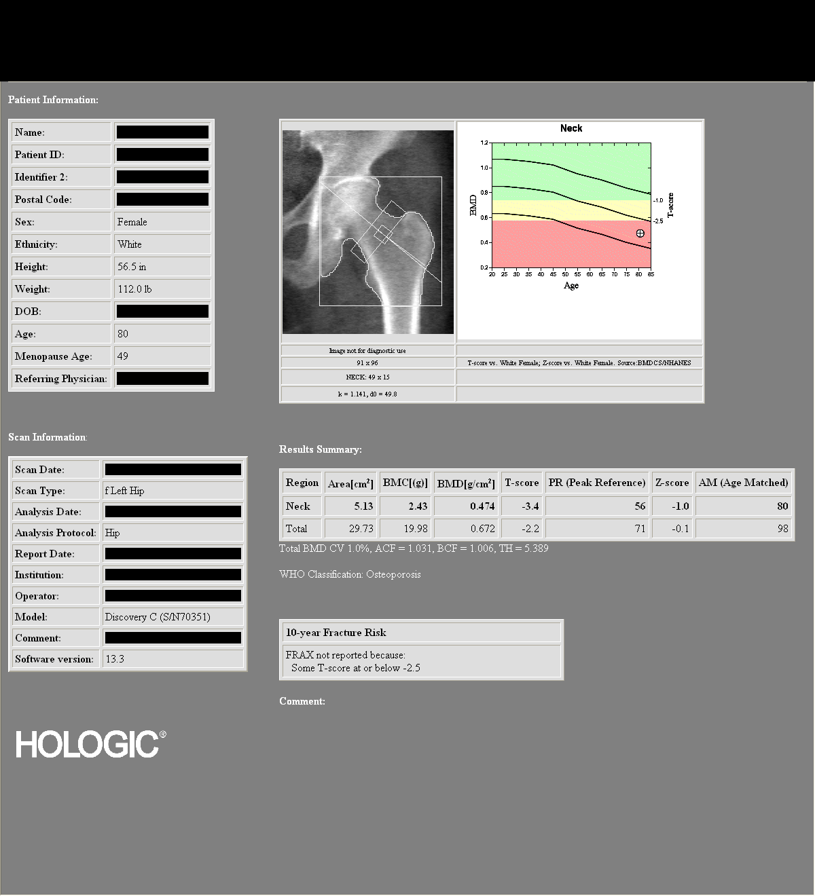

[view not recorded (3 of 4)]
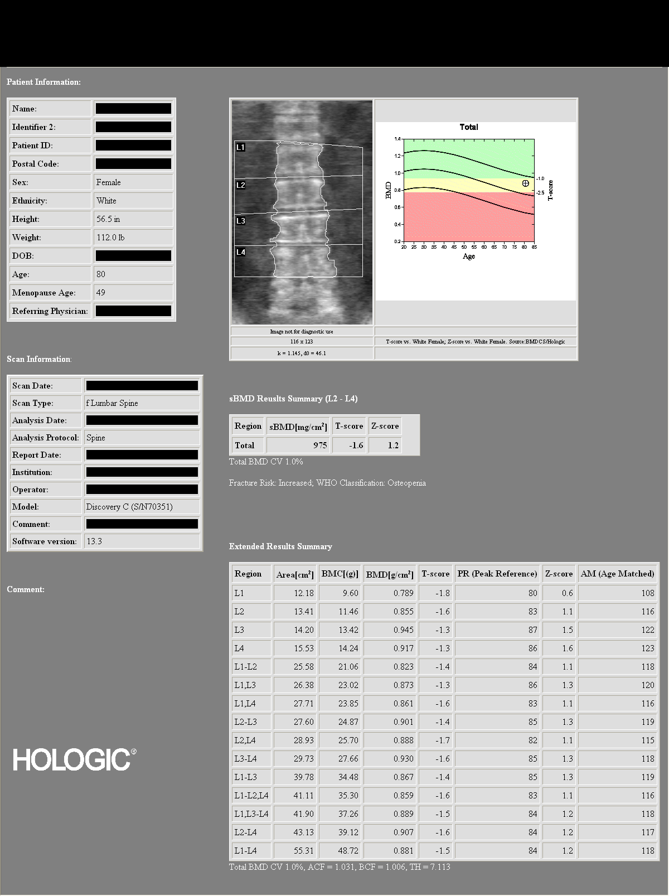

[view not recorded (4 of 4)]
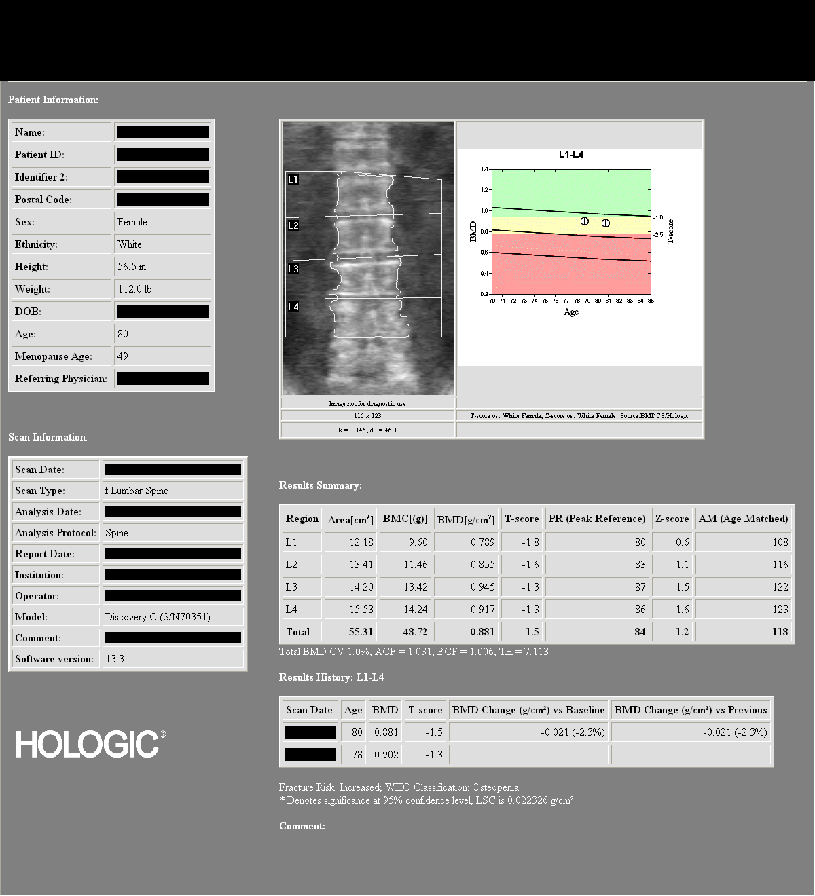

[4 of 4 positions shown; findings below may reference images not displayed]

FINDINGS: AP LUMBAR SPINE L2 through L4

Bone Mineral Density (BMD):  0.975 g/cm2

Young Adult T-Score:  -1.6

Z-Score:

RIGHT FEMUR NECK

Bone Mineral Density (BMD):  0.5 0.9 g/cm2

Young Adult T-Score: -3.0

Z-Score:  -0.6

LEFT FEMUR NECK

Bone Mineral Density (BMD):

Young Adult T Score:  -3.4

Z Score:  -1.0

ASSESSMENT: Patient's diagnostic category is OSTEOPOROSIS by WHO
Criteria.

FRACTURE RISK: INCREASED

FRAX: World Health Organization FRAX assessment of absolute fracture
risk is not calculated for this patient because the patient has
osteoporosis.
This is not considered
statistically significant. Bone mineral density of the left femur
has increased by 3.9% compared to previous study April 2012.
This is not considered statistically significant. Bone mineral
density of the right femur was not calculated in 3002.

Effective therapies are available in the form of bisphosphonates,
selective estrogen receptor modulators, biologic agents, and hormone
replacement therapy (for women). All patients should ensure an
adequate intake of dietary calcium (4499 mg daily) and vitamin D
(800 Jamal Tong) unless contraindicated.

All treatment decisions require clinical judgment and consideration
of individual patient factors, including patient preferences,
co-morbidities, previous drug use, risk factors not captured in the
FRAX model (e.g., frailty, falls, vitamin D deficiency, increased
bone turnover, interval significant decline in bone density) and
possible under- or over-estimation of fracture risk by FRAX.

The National Osteoporosis Foundation recommends that FDA-approved
medical therapies be considered in postmenopausal women and men age
50 or older with a:

1. Hip or vertebral (clinical or morphometric) fracture.

2. T-score of -2.5 or lower at the spine or hip.

3. Ten-year fracture probability by FRAX of 3% or greater for hip
fracture or 20% or greater for major osteoporotic fracture.

People with diagnosed cases of osteoporosis or at high risk for
fracture should have regular bone mineral density tests. For
patients eligible for Medicare, routine testing is allowed once
every 2 years. The testing frequency can be increased to one year
for patients who have rapidly progressing disease, those who are
receiving or discontinuing medical therapy to restore bone mass, or
have additional risk factors.

World Health Organization (WHO) Criteria:

Normal: T-scores from +1.0 to -1.0

Low Bone Mass (Osteopenia): T-scores between -1.0 and -2.5

Osteoporosis: T-scores -2.5 and below

Comparison to Reference Population:

T-score is the key measure used in the diagnosis of osteoporosis and
relative risk determination for fracture. It provides a value for
bone mass relative to the mean bone mass of a young adult reference
population expressed in terms of standard deviation (SD).

Z-score is the age-matched score showing the patient's values
compared to a population matched for age, sex, and race. This is
also expressed in terms of standard deviation. The patient may have
values that compare favorably to the age-matched values and still be
at increased risk for fracture.

## 2014-11-21 ENCOUNTER — Telehealth: Payer: Self-pay | Admitting: Family Medicine

## 2014-11-21 NOTE — Telephone Encounter (Signed)
Ok to provide the requested prescription for Zostavax to [facility].

## 2014-11-21 NOTE — Telephone Encounter (Signed)
Caller name:Egan,Debra Relation to pt: daughter  Call back number: [phone]   Reason for call:  [facility] provides shingle vaccination to residents at  [facility] and daughter is requesting a RX for shingles please indicate on RX "[facility]" fax to fax [phone]

## 2014-11-22 MED ORDER — ZOSTER VACCINE LIVE 19400 UNT/0.65ML ~~LOC~~ SOLR: 0.6500 mL | Freq: Once | SUBCUTANEOUS | Status: DC

## 2014-11-22 NOTE — Telephone Encounter (Signed)
Sent in Zostavax to [facility]. Daughter informed script sent in.

## 2014-11-29 ENCOUNTER — Telehealth: Payer: Self-pay | Admitting: *Deleted

## 2014-11-29 ENCOUNTER — Ambulatory Visit: Payer: Medicare Other

## 2014-11-29 NOTE — Telephone Encounter (Signed)
Yes please

## 2014-11-29 NOTE — Telephone Encounter (Signed)
Patient was on nurse schedule for Prolia injection, but prior authorization has not been completed.  Patient's last bone density was 04/28/14 and showed osteoporosis.  She received a Prolia injection 05/30/14.  May I start process for prior authorization for another Prolia injection?   Please advise

## 2014-12-01 NOTE — Telephone Encounter (Signed)
Please initiate PA for Prolia and notify when info is complete, so I can let the patient know!  Thank you!

## 2014-12-12 NOTE — Telephone Encounter (Signed)
I have electronically sent pt's info for Prolia insurance verification and will notify you once I have a response. Thank you. °

## 2014-12-19 NOTE — Telephone Encounter (Signed)
Caller name: Enyla Lisbon Relationship to patient: self Can be reached: [phone]  Reason for call: Pt called in to follow up on insurance auth for her injection. She is wondering if it will be approved and rescheduled for her. Please call her back.

## 2014-12-19 NOTE — Telephone Encounter (Signed)
Notified patient we are still waiting for insurance verification- she stated understanding.

## 2014-12-20 NOTE — Telephone Encounter (Signed)
I have rec'd pt's insurance verification for Prolia.  Jennifer Wilkins had an estimated responsibility of $0 for her injection.  Please let her know this is an estimate and we will not know an exact amt until her insurance has paid. I have sent a copy of the summary of benefits to be scanned into her chart.  Please let me know if you have any questions. Thank you.

## 2014-12-22 NOTE — Telephone Encounter (Signed)
Prolia injection arrived.  Notified patient and scheduled nurse visit 12/27/14 per patient preference.

## 2014-12-23 ENCOUNTER — Telehealth: Payer: Self-pay | Admitting: Family Medicine

## 2014-12-23 MED ORDER — ZOSTER VACCINE LIVE 19400 UNT/0.65ML ~~LOC~~ SOLR: 0.6500 mL | Freq: Once | SUBCUTANEOUS | Status: DC

## 2014-12-23 NOTE — Telephone Encounter (Signed)
Caller name: Air[place] Relation to pt: self  Call back number: [phone] Pharmacy: [facility] [address] [place], [place] [address] Phone [phone] or [phone]  Reason for call: Pt came in office requesting rx for shingles shot to be sent to [facility]. Please advise.

## 2014-12-23 NOTE — Telephone Encounter (Signed)
OK to give rx for Zostavax

## 2014-12-23 NOTE — Telephone Encounter (Signed)
Script sent in and family informed

## 2014-12-27 ENCOUNTER — Ambulatory Visit (INDEPENDENT_AMBULATORY_CARE_PROVIDER_SITE_OTHER): Payer: Medicare Other | Admitting: *Deleted

## 2014-12-27 DIAGNOSIS — M810 Age-related osteoporosis without current pathological fracture: Secondary | ICD-10-CM

## 2014-12-27 MED ORDER — DENOSUMAB 60 MG/ML ~~LOC~~ SOLN
60.0000 mg | Freq: Once | SUBCUTANEOUS | Status: AC
  Administered 2014-12-27: 60 mg via SUBCUTANEOUS

## 2014-12-27 NOTE — Progress Notes (Signed)
Pre visit review using our clinic review tool, if applicable. No additional management support is needed unless otherwise documented below in the visit note.  Patient tolerated injection well.  

## 2015-02-05 ENCOUNTER — Other Ambulatory Visit: Payer: Self-pay | Admitting: Family Medicine

## 2015-02-28 ENCOUNTER — Other Ambulatory Visit: Payer: Self-pay | Admitting: Family Medicine

## 2015-02-28 ENCOUNTER — Telehealth: Payer: Self-pay | Admitting: Family Medicine

## 2015-02-28 DIAGNOSIS — H9190 Unspecified hearing loss, unspecified ear: Secondary | ICD-10-CM

## 2015-02-28 NOTE — Telephone Encounter (Signed)
Caller name: Dr. Obie Dredge with [address] Can be reached: [phone]  Reason for call: Dr. Obie Dredge states that pt needs an urgent referral to ENT for unilateral left sudden hearing loss w/tentative. She states it started 1 week ago. She said to call her if you have questions. She said we need to schedule pt asap and please call the pt/family to let them know appt date and time.

## 2015-02-28 NOTE — Telephone Encounter (Signed)
Let family know I have placed the requested referral

## 2015-03-01 NOTE — Telephone Encounter (Signed)
[facility] to notify and they stated understanding.

## 2015-03-20 DIAGNOSIS — H903 Sensorineural hearing loss, bilateral: Secondary | ICD-10-CM | POA: Diagnosis not present

## 2015-03-20 DIAGNOSIS — H9312 Tinnitus, left ear: Secondary | ICD-10-CM | POA: Diagnosis not present

## 2015-03-23 ENCOUNTER — Other Ambulatory Visit: Payer: Self-pay | Admitting: Otolaryngology

## 2015-03-23 DIAGNOSIS — H9192 Unspecified hearing loss, left ear: Secondary | ICD-10-CM

## 2015-03-31 ENCOUNTER — Ambulatory Visit
Admission: RE | Admit: 2015-03-31 | Discharge: 2015-03-31 | Disposition: A | Discharge status: Home / Self Care | Payer: Medicare Other | Source: Ambulatory Visit | Attending: Otolaryngology | Admitting: Otolaryngology

## 2015-03-31 DIAGNOSIS — H9312 Tinnitus, left ear: Secondary | ICD-10-CM | POA: Diagnosis not present

## 2015-03-31 DIAGNOSIS — H9192 Unspecified hearing loss, left ear: Secondary | ICD-10-CM

## 2015-03-31 MED ORDER — GADOBENATE DIMEGLUMINE 529 MG/ML IV SOLN
10.0000 mL | Freq: Once | INTRAVENOUS | Status: AC | PRN
  Administered 2015-03-31: 10 mL via INTRAVENOUS

## 2015-04-24 ENCOUNTER — Telehealth: Payer: Self-pay | Admitting: *Deleted

## 2015-04-24 ENCOUNTER — Encounter: Payer: Self-pay | Admitting: *Deleted

## 2015-04-24 NOTE — Telephone Encounter (Signed)
Pre-Visit Call completed with patient and chart updated.   Pre-Visit Info documented in Specialty Comments under [employer].    

## 2015-04-25 ENCOUNTER — Ambulatory Visit (INDEPENDENT_AMBULATORY_CARE_PROVIDER_SITE_OTHER): Payer: Medicare Other | Admitting: Family Medicine

## 2015-04-25 ENCOUNTER — Encounter: Payer: Self-pay | Admitting: Family Medicine

## 2015-04-25 ENCOUNTER — Telehealth: Payer: Self-pay | Admitting: Family Medicine

## 2015-04-25 VITALS — BP 132/82 | HR 87 | Temp 97.9°F | Ht <= 58 in | Wt 114.0 lb

## 2015-04-25 DIAGNOSIS — L989 Disorder of the skin and subcutaneous tissue, unspecified: Secondary | ICD-10-CM | POA: Diagnosis not present

## 2015-04-25 DIAGNOSIS — E782 Mixed hyperlipidemia: Secondary | ICD-10-CM

## 2015-04-25 DIAGNOSIS — M810 Age-related osteoporosis without current pathological fracture: Secondary | ICD-10-CM

## 2015-04-25 DIAGNOSIS — Z0000 Encounter for general adult medical examination without abnormal findings: Secondary | ICD-10-CM

## 2015-04-25 DIAGNOSIS — Z23 Encounter for immunization: Secondary | ICD-10-CM | POA: Diagnosis not present

## 2015-04-25 DIAGNOSIS — H9193 Unspecified hearing loss, bilateral: Secondary | ICD-10-CM

## 2015-04-25 DIAGNOSIS — E559 Vitamin D deficiency, unspecified: Secondary | ICD-10-CM | POA: Diagnosis not present

## 2015-04-25 DIAGNOSIS — I10 Essential (primary) hypertension: Secondary | ICD-10-CM | POA: Diagnosis not present

## 2015-04-25 DIAGNOSIS — F430 Acute stress reaction: Secondary | ICD-10-CM | POA: Insufficient documentation

## 2015-04-25 HISTORY — DX: Acute stress reaction: F43.0

## 2015-04-25 LAB — LIPID PANEL
CHOLESTEROL: 175 mg/dL (ref 0–200)
HDL: 90.5 mg/dL (ref >39.00)
LDL Cholesterol: 67 mg/dL (ref 0–99)
NonHDL: 84.44
Total CHOL/HDL Ratio: 2
Triglycerides: 89 mg/dL (ref 0.0–149.0)
VLDL: 17.8 mg/dL (ref 0.0–40.0)

## 2015-04-25 LAB — CBC
HEMATOCRIT: 45.3 % (ref 36.0–46.0)
Hemoglobin: 14.9 g/dL (ref 12.0–15.0)
MCHC: 32.8 g/dL (ref 30.0–36.0)
MCV: 88.5 fl (ref 78.0–100.0)
PLATELETS: 208 10*3/uL (ref 150.0–400.0)
RBC: 5.13 Mil/uL — ABNORMAL HIGH (ref 3.87–5.11)
RDW: 14.5 % (ref 11.5–15.5)
WBC: 5.5 10*3/uL (ref 4.0–10.5)

## 2015-04-25 LAB — COMPREHENSIVE METABOLIC PANEL
ALBUMIN: 4.2 g/dL (ref 3.5–5.2)
ALT: 19 U/L (ref 0–35)
AST: 19 U/L (ref 0–37)
Alkaline Phosphatase: 56 U/L (ref 39–117)
BUN: 20 mg/dL (ref 6–23)
CALCIUM: 9.4 mg/dL (ref 8.4–10.5)
CHLORIDE: 104 meq/L (ref 96–112)
CO2: 31 mEq/L (ref 19–32)
CREATININE: 0.79 mg/dL (ref 0.40–1.20)
GFR: 74.1 mL/min (ref >60.00)
Glucose, Bld: 92 mg/dL (ref 70–99)
POTASSIUM: 4.4 meq/L (ref 3.5–5.1)
Sodium: 143 mEq/L (ref 135–145)
Total Bilirubin: 1 mg/dL (ref 0.2–1.2)
Total Protein: 7.1 g/dL (ref 6.0–8.3)

## 2015-04-25 LAB — VITAMIN D 25 HYDROXY (VIT D DEFICIENCY, FRACTURES): VITD: 139.47 ng/mL — AB (ref 30.00–100.00)

## 2015-04-25 LAB — TSH: TSH: 1.74 u[IU]/mL (ref 0.35–4.50)

## 2015-04-25 NOTE — Telephone Encounter (Signed)
Called the patient informed Vitamin D results were High at 139.47.  PCP did inform the patient to stop OTC vitamin D supplements at this time.  Patient stating she is currently taking vitamin D3 5000 IU's daily and caltrate with Vitamin D3 daily as well.  Patient agreed to stop all Vitamin D supplements at this time.

## 2015-04-25 NOTE — Assessment & Plan Note (Signed)
Check level today 

## 2015-04-25 NOTE — Progress Notes (Signed)
Pre visit review using our clinic review tool, if applicable. No additional management support is needed unless otherwise documented below in the visit note. 

## 2015-04-25 NOTE — Progress Notes (Signed)
Patient ID: Jennifer Wilkins, female   DOB: May 28, 1932, 81 y.o.   MRN: [id number]   Subjective:    Patient ID: Jennifer Wilkins, female    DOB: 1931/09/07, 81 y.o.   MRN: [id number]  Chief Complaint  Patient presents with  . Medicare Wellness    HPI Patient is in today for annual exam and follow-up on numerous concerns. She is reporting some increased tinnitus in her ears over the last several months. She relates that she stress. She has undergone a workup with audiology and ENT and was found to have only 16% hearing remaining in the left and 84% on the right. MRI she reports was unremarkable. No other neurologic complaints. She does have some irritated skin lesions in her right temple in her right groin she is concerned about but they have been present for quite some time. She continues to live at [facility] with her husband who is suffering with worsening dementia.  Past Medical History  Diagnosis Date  . Hypercholesteremia   . Arthritis   . HTN (hypertension)   . Hypocalcemia 06/19/2013  . Elevated BP 04/21/2014    Past Surgical History  Procedure Laterality Date  . Gallbladder surgery  1984    Gallstone removal    No family history on file.  Social History   Social History  . Marital Status: Married    Spouse Name: N/A  . Number of Children: N/A  . Years of Education: N/A   Occupational History  . Not on file.   Social History Main Topics  . Smoking status: Never Smoker   . Smokeless tobacco: Never Used  . Alcohol Use: No  . Drug Use: Not on file  . Sexual Activity: Not on file   Other Topics Concern  . Not on file   Social History Narrative    Outpatient Prescriptions Prior to Visit  Medication Sig Dispense Refill  . aspirin 81 MG tablet Take 81 mg by mouth daily.    . Calcium Carbonate-Vitamin D (CALCIUM 600/VITAMIN D) 600-400 MG-UNIT per tablet Take 1 tablet by mouth 2 (two) times daily.    . carvedilol (COREG) 12.5 MG tablet TAKE 1 TABLET DAILY 90 tablet 1  .  cholecalciferol (VITAMIN D) 400 UNITS TABS Take 5,000 Units by mouth daily.     . Cyanocobalamin (VITAMIN B-12 CR) 1500 MCG TBCR Take by mouth daily.    . hydrochlorothiazide (MICROZIDE) 12.5 MG capsule TAKE 1 CAPSULE DAILY 90 capsule 1  . Misc Natural Products (OSTEO BI-FLEX ADV JOINT SHIELD PO) Take by mouth daily.    . simvastatin (ZOCOR) 40 MG tablet TAKE 1 TABLET EVERY EVENING 90 tablet 1  . zoster vaccine live, PF, (ZOSTAVAX) 28315 UNT/0.65ML injection Inject 19,400 Units into the skin once. 1 each 0   No facility-administered medications prior to visit.    No Known Allergies  Review of Systems  Constitutional: Negative for fever and malaise/fatigue.  HENT: Negative for congestion.   Eyes: Negative for discharge.  Respiratory: Negative for shortness of breath.   Cardiovascular: Negative for chest pain, palpitations and leg swelling.  Gastrointestinal: Negative for nausea and abdominal pain.  Genitourinary: Negative for dysuria.  Musculoskeletal: Negative for falls.  Skin: Negative for rash.  Neurological: Negative for loss of consciousness and headaches.  Endo/Heme/Allergies: Negative for environmental allergies.  Psychiatric/Behavioral: Negative for depression. The patient is not nervous/anxious.        Objective:    Physical Exam  Constitutional: She is oriented to person, place,  and time. She appears well-developed and well-nourished. No distress.  HENT:  Head: Normocephalic and atraumatic.  Eyes: Conjunctivae are normal.  Neck: Neck supple. No thyromegaly present.  Cardiovascular: Normal rate, regular rhythm and normal heart sounds.   No murmur heard. Pulmonary/Chest: Effort normal and breath sounds normal. No respiratory distress.  Abdominal: Soft. Bowel sounds are normal. She exhibits no distension and no mass. There is no tenderness.  Musculoskeletal: She exhibits no edema.  Lymphadenopathy:    She has no cervical adenopathy.  Neurological: She is alert and  oriented to person, place, and time.  Skin: Skin is warm and dry.  Psychiatric: She has a normal mood and affect. Her behavior is normal.    BP 132/82 mmHg  Pulse 87  Temp(Src) 97.9 F (36.6 C) (Oral)  Ht 4\' 7"  (1.397 m)  Wt 114 lb (51.71 kg)  BMI 26.50 kg/m2  SpO2 94% Wt Readings from Last 3 Encounters:  04/25/15 114 lb (51.71 kg)  10/14/14 113 lb 8 oz (51.483 kg)  04/21/14 112 lb 9.6 oz (51.075 kg)     Lab Results  Component Value Date   WBC 4.8 04/07/2014   HGB 13.8 04/07/2014   HCT 41.4 04/07/2014   PLT 199.0 04/07/2014   GLUCOSE 90 10/14/2014   CHOL 177 10/14/2014   TRIG 83 10/14/2014   HDL 99 10/14/2014   LDLCALC 61 10/14/2014   ALT 17 10/14/2014   AST 19 10/14/2014   NA 141 10/14/2014   K 4.2 10/14/2014   CL 102 10/14/2014   CREATININE 0.80 10/14/2014   BUN 20 10/14/2014   CO2 27 10/14/2014   TSH 1.57 04/07/2014    Lab Results  Component Value Date   TSH 1.57 04/07/2014   Lab Results  Component Value Date   WBC 4.8 04/07/2014   HGB 13.8 04/07/2014   HCT 41.4 04/07/2014   MCV 86.8 04/07/2014   PLT 199.0 04/07/2014   Lab Results  Component Value Date   NA 141 10/14/2014   K 4.2 10/14/2014   CO2 27 10/14/2014   GLUCOSE 90 10/14/2014   BUN 20 10/14/2014   CREATININE 0.80 10/14/2014   BILITOT 1.0 10/14/2014   ALKPHOS 62 10/14/2014   AST 19 10/14/2014   ALT 17 10/14/2014   PROT 7.2 10/14/2014   ALBUMIN 4.5 10/14/2014   CALCIUM 10.2 10/14/2014   GFR 76.53 04/07/2014   Lab Results  Component Value Date   CHOL 177 10/14/2014   Lab Results  Component Value Date   HDL 99 10/14/2014   Lab Results  Component Value Date   LDLCALC 61 10/14/2014   Lab Results  Component Value Date   TRIG 83 10/14/2014   Lab Results  Component Value Date   CHOLHDL 1.8 10/14/2014   No results found for: HGBA1C     Assessment & Plan:   Osteoporosis Prolia due in December needs PA process started before due. Check Vitamin D  today  Hypocalcemia Check level today  Essential hypertension Well controlled, no changes to meds. Encouraged heart healthy diet such as the DASH diet and exercise as tolerated.   Facial lesion Lesion in right groin and on right temple referred to dermatology for consideration  Vitamin D deficiency Over treated, she will hold supplements temporarily and then restart in smaller doses and monitor  Hyperlipidemia, mixed Tolerating statin, encouraged heart healthy diet, avoid trans fats, minimize simple carbs and saturated fats. 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 No trouble with ADLs, depression or falls. No recent changes to vision or hearing. Is UTD with immunizations. Is UTD with screening. Discussed Advanced Directives, patient agrees to bring [place] copies of documents if can. Encouraged heart healthy diet, exercise as tolerated and adequate sleep. Labs reviewed. Immunization Status: Flu vaccine-- would like at appointment  Tdap-- 06/14/14  PNA--10/14/14 (13)  Shingles--07/29/14   A/P:  Changes to FH, PSH or Personal Hx: UTD  Pap-- none recent  MMG-- 01/07/12 with Ronny Bacon, MD at [facility]- BI-RADS CAT 1: Neg  Bone Density-- 04/28/14 - Osteoporosis  CCS--unknown date (been a long time, states was normal)   Care Teams Updated: No other providers  ED/Hospital/Urgent Care Visits: None recent   See problem list for risk factors See AVS for health care screening recommendations.  Hearing loss Recent testing showed right ear at 84 % and left year at 16%. Not a candidate for hearing aides. Reports an MRI was obtained by ENT and was unremarkable    I am having Ms.  Nightengale maintain her Vitamin B-12 CR, cholecalciferol, aspirin, Misc Natural Products (OSTEO BI-FLEX ADV JOINT SHIELD PO), Calcium Carbonate-Vitamin D, zoster vaccine live (PF), hydrochlorothiazide, simvastatin, and carvedilol.  No orders of the defined types were placed in this encounter.     Elizabeth Sauer, LPN

## 2015-04-25 NOTE — Assessment & Plan Note (Signed)
Well controlled, no changes to meds. Encouraged heart healthy diet such as the DASH diet and exercise as tolerated.  °

## 2015-04-25 NOTE — Assessment & Plan Note (Signed)
Prolia due in December needs PA process started before due. Check Vitamin D today

## 2015-04-25 NOTE — Patient Instructions (Addendum)
Schedule eye exam with Dr. Marica Otter  Preventive Care for Adults A healthy lifestyle and preventive care can promote health and wellness. Preventive health guidelines for women include the following key practices.  A routine yearly physical is a good way to check with your health care provider about your health and preventive screening. It is a chance to share any concerns and updates on your health and to receive a thorough exam.  Visit your dentist for a routine exam and preventive care every 6 months. Brush your teeth twice a day and floss once a day. Good oral hygiene prevents tooth decay and gum disease.  The frequency of eye exams is based on your age, health, family medical history, use of contact lenses, and other factors. Follow your health care provider's recommendations for frequency of eye exams.  Eat a healthy diet. Foods like vegetables, fruits, whole grains, low-fat dairy products, and lean protein foods contain the nutrients you need without too many calories. Decrease your intake of foods high in solid fats, added sugars, and salt. Eat the right amount of calories for you.Get information about a proper diet from your health care provider, if necessary.  Regular physical exercise is one of the most important things you can do for your health. Most adults should get at least 150 minutes of moderate-intensity exercise (any activity that increases your heart rate and causes you to sweat) each week. In addition, most adults need muscle-strengthening exercises on 2 or more days a week.  Maintain a healthy weight. The body mass index (BMI) is a screening tool to identify possible weight problems. It provides an estimate of body fat based on height and weight. Your health care provider can find your BMI and can help you achieve or maintain a healthy weight.For adults 20 years and older:  A BMI below 18.5 is considered underweight.  A BMI of 18.5 to 24.9 is normal.  A BMI of 25 to  29.9 is considered overweight.  A BMI of 30 and above is considered obese.  Maintain normal blood lipids and cholesterol levels by exercising and minimizing your intake of saturated fat. Eat a balanced diet with plenty of fruit and vegetables. Blood tests for lipids and cholesterol should begin at age 25 and be repeated every 5 years. If your lipid or cholesterol levels are high, you are over 50, or you are at high risk for heart disease, you may need your cholesterol levels checked more frequently.Ongoing high lipid and cholesterol levels should be treated with medicines if diet and exercise are not working.  If you smoke, find out from your health care provider how to quit. If you do not use tobacco, do not start.  Lung cancer screening is recommended for adults aged 67-80 years who are at high risk for developing lung cancer because of a history of smoking. A yearly low-dose CT scan of the lungs is recommended for people who have at least a 30-pack-year history of smoking and are a current smoker or have quit within the past 15 years. A pack year of smoking is smoking an average of 1 pack of cigarettes a day for 1 year (for example: 1 pack a day for 30 years or 2 packs a day for 15 years). Yearly screening should continue until the smoker has stopped smoking for at least 15 years. Yearly screening should be stopped for people who develop a health problem that would prevent them from having lung cancer treatment.  If you are pregnant,  do not drink alcohol. If you are breastfeeding, be very cautious about drinking alcohol. If you are not pregnant and choose to drink alcohol, do not have more than 1 drink per day. One drink is considered to be 12 ounces (355 mL) of beer, 5 ounces (148 mL) of wine, or 1.5 ounces (44 mL) of liquor.  Avoid use of street drugs. Do not share needles with anyone. Ask for help if you need support or instructions about stopping the use of drugs.  High blood pressure causes  heart disease and increases the risk of stroke. Your blood pressure should be checked at least every 1 to 2 years. Ongoing high blood pressure should be treated with medicines if weight loss and exercise do not work.  If you are 97-80 years old, ask your health care provider if you should take aspirin to prevent strokes.  Diabetes screening involves taking a blood sample to check your fasting blood sugar level. This should be done once every 3 years, after age 53, if you are within normal weight and without risk factors for diabetes. Testing should be considered at a younger age or be carried out more frequently if you are overweight and have at least 1 risk factor for diabetes.  Breast cancer screening is essential preventive care for women. You should practice "breast self-awareness." This means understanding the normal appearance and feel of your breasts and may include breast self-examination. Any changes detected, no matter how small, should be reported to a health care provider. Women in their 36s and 30s should have a clinical breast exam (CBE) by a health care provider as part of a regular health exam every 1 to 3 years. After age 77, women should have a CBE every year. Starting at age 23, women should consider having a mammogram (breast X-ray test) every year. Women who have a family history of breast cancer should talk to their health care provider about genetic screening. Women at a high risk of breast cancer should talk to their health care providers about having an MRI and a mammogram every year.  Breast cancer gene (BRCA)-related cancer risk assessment is recommended for women who have family members with BRCA-related cancers. BRCA-related cancers include breast, ovarian, tubal, and peritoneal cancers. Having family members with these cancers may be associated with an increased risk for harmful changes (mutations) in the breast cancer genes BRCA1 and BRCA2. Results of the assessment will  determine the need for genetic counseling and BRCA1 and BRCA2 testing.  Routine pelvic exams to screen for cancer are no longer recommended for nonpregnant women who are considered low risk for cancer of the pelvic organs (ovaries, uterus, and vagina) and who do not have symptoms. Ask your health care provider if a screening pelvic exam is right for you.  If you have had past treatment for cervical cancer or a condition that could lead to cancer, you need Pap tests and screening for cancer for at least 20 years after your treatment. If Pap tests have been discontinued, your risk factors (such as having a new sexual partner) need to be reassessed to determine if screening should be resumed. Some women have medical problems that increase the chance of getting cervical cancer. In these cases, your health care provider may recommend more frequent screening and Pap tests.  The HPV test is an additional test that may be used for cervical cancer screening. The HPV test looks for the virus that can cause the cell changes on the cervix. The  cells collected during the Pap test can be tested for HPV. The HPV test could be used to screen women aged 60 years and older, and should be used in women of any age who have unclear Pap test results. After the age of 50, women should have HPV testing at the same frequency as a Pap test.  Colorectal cancer can be detected and often prevented. Most routine colorectal cancer screening begins at the age of 50 years and continues through age 57 years. However, your health care provider may recommend screening at an earlier age if you have risk factors for colon cancer. On a yearly basis, your health care provider may provide home test kits to check for hidden blood in the stool. Use of a small camera at the end of a tube, to directly examine the colon (sigmoidoscopy or colonoscopy), can detect the earliest forms of colorectal cancer. Talk to your health care provider about this at age  36, when routine screening begins. Direct exam of the colon should be repeated every 5-10 years through age 72 years, unless early forms of pre-cancerous polyps or small growths are found.  People who are at an increased risk for hepatitis B should be screened for this virus. You are considered at high risk for hepatitis B if:  You were born in a country where hepatitis B occurs often. Talk with your health care provider about which countries are considered high risk.  Your parents were born in a high-risk country and you have not received a shot to protect against hepatitis B (hepatitis B vaccine).  You have HIV or AIDS.  You use needles to inject street drugs.  You live with, or have sex with, someone who has hepatitis B.  You get hemodialysis treatment.  You take certain medicines for conditions like cancer, organ transplantation, and autoimmune conditions.  Hepatitis C blood testing is recommended for all people born from [birth year] through 1965 and any individual with known risks for hepatitis C.  Practice safe sex. Use condoms and avoid high-risk sexual practices to reduce the spread of sexually transmitted infections (STIs). STIs include gonorrhea, chlamydia, syphilis, trichomonas, herpes, HPV, and human immunodeficiency virus (HIV). Herpes, HIV, and HPV are viral illnesses that have no cure. They can result in disability, cancer, and death.  You should be screened for sexually transmitted illnesses (STIs) including gonorrhea and chlamydia if:  You are sexually active and are younger than 24 years.  You are older than 24 years and your health care provider tells you that you are at risk for this type of infection.  Your sexual activity has changed since you were last screened and you are at an increased risk for chlamydia or gonorrhea. Ask your health care provider if you are at risk.  If you are at risk of being infected with HIV, it is recommended that you take a prescription  medicine daily to prevent HIV infection. This is called preexposure prophylaxis (PrEP). You are considered at risk if:  You are a heterosexual woman, are sexually active, and are at increased risk for HIV infection.  You take drugs by injection.  You are sexually active with a partner who has HIV.  Talk with your health care provider about whether you are at high risk of being infected with HIV. If you choose to begin PrEP, you should first be tested for HIV. You should then be tested every 3 months for as long as you are taking PrEP.  Osteoporosis is a disease  in which the bones lose minerals and strength with aging. This can result in serious bone fractures or breaks. The risk of osteoporosis can be identified using a bone density scan. Women ages 31 years and over and women at risk for fractures or osteoporosis should discuss screening with their health care providers. Ask your health care provider whether you should take a calcium supplement or vitamin D to reduce the rate of osteoporosis.  Menopause can be associated with physical symptoms and risks. Hormone replacement therapy is available to decrease symptoms and risks. You should talk to your health care provider about whether hormone replacement therapy is right for you.  Use sunscreen. Apply sunscreen liberally and repeatedly throughout the day. You should seek shade when your shadow is shorter than you. Protect yourself by wearing long sleeves, pants, a wide-brimmed hat, and sunglasses year round, whenever you are outdoors.  Once a month, do a whole body skin exam, using a mirror to look at the skin on your back. Tell your health care provider of new moles, moles that have irregular borders, moles that are larger than a pencil eraser, or moles that have changed in shape or color.  Stay current with required vaccines (immunizations).  Influenza vaccine. All adults should be immunized every year.  Tetanus, diphtheria, and acellular  pertussis (Td, Tdap) vaccine. Pregnant women should receive 1 dose of Tdap vaccine during each pregnancy. The dose should be obtained regardless of the length of time since the last dose. Immunization is preferred during the 27th-36th week of gestation. An adult who has not previously received Tdap or who does not know her vaccine status should receive 1 dose of Tdap. This initial dose should be followed by tetanus and diphtheria toxoids (Td) booster doses every 10 years. Adults with an unknown or incomplete history of completing a 3-dose immunization series with Td-containing vaccines should begin or complete a primary immunization series including a Tdap dose. Adults should receive a Td booster every 10 years.  Varicella vaccine. An adult without evidence of immunity to varicella should receive 2 doses or a second dose if she has previously received 1 dose. Pregnant females who do not have evidence of immunity should receive the first dose after pregnancy. This first dose should be obtained before leaving the health care facility. The second dose should be obtained 4-8 weeks after the first dose.  Human papillomavirus (HPV) vaccine. Females aged 13-26 years who have not received the vaccine previously should obtain the 3-dose series. The vaccine is not recommended for use in pregnant females. However, pregnancy testing is not needed before receiving a dose. If a female is found to be pregnant after receiving a dose, no treatment is needed. In that case, the remaining doses should be delayed until after the pregnancy. Immunization is recommended for any person with an immunocompromised condition through the age of 58 years if she did not get any or all doses earlier. During the 3-dose series, the second dose should be obtained 4-8 weeks after the first dose. The third dose should be obtained 24 weeks after the first dose and 16 weeks after the second dose.  Zoster vaccine. One dose is recommended for adults  aged 28 years or older unless certain conditions are present.  Measles, mumps, and rubella (MMR) vaccine. Adults born before [birth year] generally are considered immune to measles and mumps. Adults born in [birth year] or later should have 1 or more doses of MMR vaccine unless there is a contraindication to the vaccine  or there is laboratory evidence of immunity to each of the three diseases. A routine second dose of MMR vaccine should be obtained at least 28 days after the first dose for students attending postsecondary schools, health care workers, or international travelers. People who received inactivated measles vaccine or an unknown type of measles vaccine during 1963-1967 should receive 2 doses of MMR vaccine. People who received inactivated mumps vaccine or an unknown type of mumps vaccine before 1979 and are at high risk for mumps infection should consider immunization with 2 doses of MMR vaccine. For females of childbearing age, rubella immunity should be determined. If there is no evidence of immunity, females who are not pregnant should be vaccinated. If there is no evidence of immunity, females who are pregnant should delay immunization until after pregnancy. Unvaccinated health care workers born before [birth year] who lack laboratory evidence of measles, mumps, or rubella immunity or laboratory confirmation of disease should consider measles and mumps immunization with 2 doses of MMR vaccine or rubella immunization with 1 dose of MMR vaccine.  Pneumococcal 13-valent conjugate (PCV13) vaccine. When indicated, a person who is uncertain of her immunization history and has no record of immunization should receive the PCV13 vaccine. An adult aged 32 years or older who has certain medical conditions and has not been previously immunized should receive 1 dose of PCV13 vaccine. This PCV13 should be followed with a dose of pneumococcal polysaccharide (PPSV23) vaccine. The PPSV23 vaccine dose should be obtained at least 8 weeks  after the dose of PCV13 vaccine. An adult aged 1 years or older who has certain medical conditions and previously received 1 or more doses of PPSV23 vaccine should receive 1 dose of PCV13. The PCV13 vaccine dose should be obtained 1 or more years after the last PPSV23 vaccine dose.  Pneumococcal polysaccharide (PPSV23) vaccine. When PCV13 is also indicated, PCV13 should be obtained first. All adults aged 73 years and older should be immunized. An adult younger than age 65 years who has certain medical conditions should be immunized. Any person who resides in a nursing home or long-term care facility should be immunized. An adult smoker should be immunized. People with an immunocompromised condition and certain other conditions should receive both PCV13 and PPSV23 vaccines. People with human immunodeficiency virus (HIV) infection should be immunized as soon as possible after diagnosis. Immunization during chemotherapy or radiation therapy should be avoided. Routine use of PPSV23 vaccine is not recommended for American Indians, [place] Natives, or people younger than 65 years unless there are medical conditions that require PPSV23 vaccine. When indicated, people who have unknown immunization and have no record of immunization should receive PPSV23 vaccine. One-time revaccination 5 years after the first dose of PPSV23 is recommended for people aged 19-64 years who have chronic kidney failure, nephrotic syndrome, asplenia, or immunocompromised conditions. People who received 1-2 doses of PPSV23 before age 19 years should receive another dose of PPSV23 vaccine at age 64 years or later if at least 5 years have passed since the previous dose. Doses of PPSV23 are not needed for people immunized with PPSV23 at or after age 43 years.  Meningococcal vaccine. Adults with asplenia or persistent complement component deficiencies should receive 2 doses of quadrivalent meningococcal conjugate (MenACWY-D) vaccine. The doses  should be obtained at least 2 months apart. Microbiologists working with certain meningococcal bacteria, [place] recruits, people at risk during an outbreak, and people who travel to or live in countries with a high rate of meningitis should be  immunized. A first-year college student up through age 66 years who is living in a residence hall should receive a dose if she did not receive a dose on or after her 16th birthday. Adults who have certain high-risk conditions should receive one or more doses of vaccine.  Hepatitis A vaccine. Adults who wish to be protected from this disease, have certain high-risk conditions, work with hepatitis A-infected animals, work in hepatitis A research labs, or travel to or work in countries with a high rate of hepatitis A should be immunized. Adults who were previously unvaccinated and who anticipate close contact with an international adoptee during the first 60 days after arrival in the [place] from a country with a high rate of hepatitis A should be immunized.  Hepatitis B vaccine. Adults who wish to be protected from this disease, have certain high-risk conditions, may be exposed to blood or other infectious body fluids, are household contacts or sex partners of hepatitis B positive people, are clients or workers in certain care facilities, or travel to or work in countries with a high rate of hepatitis B should be immunized.  Haemophilus influenzae type b (Hib) vaccine. A previously unvaccinated person with asplenia or sickle cell disease or having a scheduled splenectomy should receive 1 dose of Hib vaccine. Regardless of previous immunization, a recipient of a hematopoietic stem cell transplant should receive a 3-dose series 6-12 months after her successful transplant. Hib vaccine is not recommended for adults with HIV infection. Preventive Services / Frequency Ages 72 to 72 years  Blood pressure check.** / Every 1 to 2 years.  Lipid and cholesterol check.**  / Every 5 years beginning at age 74.  Clinical breast exam.** / Every 3 years for women in their 67s and 58s.  BRCA-related cancer risk assessment.** / For women who have family members with a BRCA-related cancer (breast, ovarian, tubal, or peritoneal cancers).  Pap test.** / Every 2 years from ages 72 through 48. Every 3 years starting at age 73 through age 81 or 42 with a history of 3 consecutive normal Pap tests.  HPV screening.** / Every 3 years from ages 54 through ages 22 to 55 with a history of 3 consecutive normal Pap tests.  Hepatitis C blood test.** / For any individual with known risks for hepatitis C.  Skin self-exam. / Monthly.  Influenza vaccine. / Every year.  Tetanus, diphtheria, and acellular pertussis (Tdap, Td) vaccine.** / Consult your health care provider. Pregnant women should receive 1 dose of Tdap vaccine during each pregnancy. 1 dose of Td every 10 years.  Varicella vaccine.** / Consult your health care provider. Pregnant females who do not have evidence of immunity should receive the first dose after pregnancy.  HPV vaccine. / 3 doses over 6 months, if 36 and younger. The vaccine is not recommended for use in pregnant females. However, pregnancy testing is not needed before receiving a dose.  Measles, mumps, rubella (MMR) vaccine.** / You need at least 1 dose of MMR if you were born in 1957 or later. You may also need a 2nd dose. For females of childbearing age, rubella immunity should be determined. If there is no evidence of immunity, females who are not pregnant should be vaccinated. If there is no evidence of immunity, females who are pregnant should delay immunization until after pregnancy.  Pneumococcal 13-valent conjugate (PCV13) vaccine.** / Consult your health care provider.  Pneumococcal polysaccharide (PPSV23) vaccine.** / 1 to 2 doses if you smoke cigarettes or  if you have certain conditions.  Meningococcal vaccine.** / 1 dose if you are age 26 to 30  years and a Market researcher living in a residence hall, or have one of several medical conditions, you need to get vaccinated against meningococcal disease. You may also need additional booster doses.  Hepatitis A vaccine.** / Consult your health care provider.  Hepatitis B vaccine.** / Consult your health care provider.  Haemophilus influenzae type b (Hib) vaccine.** / Consult your health care provider. Ages 74 to 108 years  Blood pressure check.** / Every 1 to 2 years.  Lipid and cholesterol check.** / Every 5 years beginning at age 21 years.  Lung cancer screening. / Every year if you are aged 74-80 years and have a 30-pack-year history of smoking and currently smoke or have quit within the past 15 years. Yearly screening is stopped once you have quit smoking for at least 15 years or develop a health problem that would prevent you from having lung cancer treatment.  Clinical breast exam.** / Every year after age 65 years.  BRCA-related cancer risk assessment.** / For women who have family members with a BRCA-related cancer (breast, ovarian, tubal, or peritoneal cancers).  Mammogram.** / Every year beginning at age 32 years and continuing for as long as you are in good health. Consult with your health care provider.  Pap test.** / Every 3 years starting at age 74 years through age 71 or 63 years with a history of 3 consecutive normal Pap tests.  HPV screening.** / Every 3 years from ages 16 years through ages 52 to 32 years with a history of 3 consecutive normal Pap tests.  Fecal occult blood test (FOBT) of stool. / Every year beginning at age 87 years and continuing until age 54 years. You may not need to do this test if you get a colonoscopy every 10 years.  Flexible sigmoidoscopy or colonoscopy.** / Every 5 years for a flexible sigmoidoscopy or every 10 years for a colonoscopy beginning at age 22 years and continuing until age 39 years.  Hepatitis C blood test.** / For  all people born from [birth year] through 1963 and any individual with known risks for hepatitis C.  Skin self-exam. / Monthly.  Influenza vaccine. / Every year.  Tetanus, diphtheria, and acellular pertussis (Tdap/Td) vaccine.** / Consult your health care provider. Pregnant women should receive 1 dose of Tdap vaccine during each pregnancy. 1 dose of Td every 10 years.  Varicella vaccine.** / Consult your health care provider. Pregnant females who do not have evidence of immunity should receive the first dose after pregnancy.  Zoster vaccine.** / 1 dose for adults aged 22 years or older.  Measles, mumps, rubella (MMR) vaccine.** / You need at least 1 dose of MMR if you were born in 1957 or later. You may also need a 2nd dose. For females of childbearing age, rubella immunity should be determined. If there is no evidence of immunity, females who are not pregnant should be vaccinated. If there is no evidence of immunity, females who are pregnant should delay immunization until after pregnancy.  Pneumococcal 13-valent conjugate (PCV13) vaccine.** / Consult your health care provider.  Pneumococcal polysaccharide (PPSV23) vaccine.** / 1 to 2 doses if you smoke cigarettes or if you have certain conditions.  Meningococcal vaccine.** / Consult your health care provider.  Hepatitis A vaccine.** / Consult your health care provider.  Hepatitis B vaccine.** / Consult your health care provider.  Haemophilus influenzae type b (Hib)  vaccine.** / Consult your health care provider. Ages 63 years and over  Blood pressure check.** / Every 1 to 2 years.  Lipid and cholesterol check.** / Every 5 years beginning at age 66 years.  Lung cancer screening. / Every year if you are aged 44-80 years and have a 30-pack-year history of smoking and currently smoke or have quit within the past 15 years. Yearly screening is stopped once you have quit smoking for at least 15 years or develop a health problem that would prevent  you from having lung cancer treatment.  Clinical breast exam.** / Every year after age 58 years.  BRCA-related cancer risk assessment.** / For women who have family members with a BRCA-related cancer (breast, ovarian, tubal, or peritoneal cancers).  Mammogram.** / Every year beginning at age 104 years and continuing for as long as you are in good health. Consult with your health care provider.  Pap test.** / Every 3 years starting at age 64 years through age 88 or 34 years with 3 consecutive normal Pap tests. Testing can be stopped between 65 and 70 years with 3 consecutive normal Pap tests and no abnormal Pap or HPV tests in the past 10 years.  HPV screening.** / Every 3 years from ages 74 years through ages 46 or 40 years with a history of 3 consecutive normal Pap tests. Testing can be stopped between 65 and 70 years with 3 consecutive normal Pap tests and no abnormal Pap or HPV tests in the past 10 years.  Fecal occult blood test (FOBT) of stool. / Every year beginning at age 81 years and continuing until age 80 years. You may not need to do this test if you get a colonoscopy every 10 years.  Flexible sigmoidoscopy or colonoscopy.** / Every 5 years for a flexible sigmoidoscopy or every 10 years for a colonoscopy beginning at age 25 years and continuing until age 48 years.  Hepatitis C blood test.** / For all people born from [birth year] through 1963 and any individual with known risks for hepatitis C.  Osteoporosis screening.** / A one-time screening for women ages 28 years and over and women at risk for fractures or osteoporosis.  Skin self-exam. / Monthly.  Influenza vaccine. / Every year.  Tetanus, diphtheria, and acellular pertussis (Tdap/Td) vaccine.** / 1 dose of Td every 10 years.  Varicella vaccine.** / Consult your health care provider.  Zoster vaccine.** / 1 dose for adults aged 77 years or older.  Pneumococcal 13-valent conjugate (PCV13) vaccine.** / Consult your health care  provider.  Pneumococcal polysaccharide (PPSV23) vaccine.** / 1 dose for all adults aged 70 years and older.  Meningococcal vaccine.** / Consult your health care provider.  Hepatitis A vaccine.** / Consult your health care provider.  Hepatitis B vaccine.** / Consult your health care provider.  Haemophilus influenzae type b (Hib) vaccine.** / Consult your health care provider. ** Family history and personal history of risk and conditions may change your health care provider's recommendations. Document Released: 09/10/2001 Document Revised: 11/29/2013 Document Reviewed: 12/10/2010 [facility] Patient Information 2015 [place], [place]. This information is not intended to replace advice given to you by your health care provider. Make sure you discuss any questions you have with your health care provider.

## 2015-04-26 ENCOUNTER — Telehealth: Payer: Self-pay | Admitting: *Deleted

## 2015-04-26 NOTE — Telephone Encounter (Signed)
Received call from [facility] lab with a critical lab report.  Vit. D:[phone].      Critical results given verbally to Dr. Blyth.//AB/CMA

## 2015-05-03 ENCOUNTER — Encounter: Payer: Self-pay | Admitting: Family Medicine

## 2015-05-03 DIAGNOSIS — L989 Disorder of the skin and subcutaneous tissue, unspecified: Secondary | ICD-10-CM

## 2015-05-03 DIAGNOSIS — Z0000 Encounter for general adult medical examination without abnormal findings: Secondary | ICD-10-CM | POA: Insufficient documentation

## 2015-05-03 HISTORY — DX: Disorder of the skin and subcutaneous tissue, unspecified: L98.9

## 2015-05-03 HISTORY — DX: Encounter for general adult medical examination without abnormal findings: Z00.00

## 2015-05-03 NOTE — Assessment & Plan Note (Signed)
Lesion in right groin and on right temple referred to dermatology for consideration

## 2015-05-03 NOTE — Assessment & Plan Note (Addendum)
Patient denies any difficulties at home. No trouble with ADLs, depression or falls. No recent changes to vision or hearing. Is UTD with immunizations. Is UTD with screening. Discussed Advanced Directives, patient agrees to bring [place] copies of documents if can. Encouraged heart healthy diet, exercise as tolerated and adequate sleep. Labs reviewed. Immunization Status: Flu vaccine-- would like at appointment  Tdap-- 06/14/14  PNA--10/14/14 (13)  Shingles--07/29/14   A/P:  Changes to FH, PSH or Personal Hx: UTD  Pap-- none recent  MMG-- 01/07/12 with Ronny Bacon, MD at [facility]- BI-RADS CAT 1: Neg  Bone Density-- 04/28/14 - Osteoporosis  CCS--unknown date (been a long time, states was normal)   Care Teams Updated: No other providers  ED/Hospital/Urgent Care Visits: None recent   See problem list for risk factors See AVS for health care screening recommendations.

## 2015-05-03 NOTE — Assessment & Plan Note (Addendum)
Recent testing showed right ear at 84 % and left year at 16%. Not a candidate for hearing aides. Reports an MRI was obtained by ENT and was unremarkable

## 2015-05-03 NOTE — Assessment & Plan Note (Addendum)
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

## 2015-05-03 NOTE — Assessment & Plan Note (Signed)
Tolerating statin, encouraged heart healthy diet, avoid trans fats, minimize simple carbs and saturated fats. Increase exercise as tolerated 

## 2015-05-03 NOTE — Assessment & Plan Note (Signed)
Over treated, she will hold supplements temporarily and then restart in smaller doses and monitor

## 2015-06-27 DIAGNOSIS — D485 Neoplasm of uncertain behavior of skin: Secondary | ICD-10-CM | POA: Diagnosis not present

## 2015-06-27 DIAGNOSIS — L814 Other melanin hyperpigmentation: Secondary | ICD-10-CM | POA: Diagnosis not present

## 2015-06-27 DIAGNOSIS — Z23 Encounter for immunization: Secondary | ICD-10-CM | POA: Diagnosis not present

## 2015-06-27 DIAGNOSIS — L821 Other seborrheic keratosis: Secondary | ICD-10-CM | POA: Diagnosis not present

## 2015-06-28 DIAGNOSIS — B079 Viral wart, unspecified: Secondary | ICD-10-CM | POA: Diagnosis not present

## 2015-06-30 ENCOUNTER — Ambulatory Visit: Payer: Medicare Other

## 2015-06-30 ENCOUNTER — Telehealth: Payer: Self-pay | Admitting: Behavioral Health

## 2015-06-30 NOTE — Telephone Encounter (Signed)
Patient presented in office today for a Prolia injection, however it was unavailable. Informed patient that she will be contacted to schedule a nurse visit once prior auth/medication has been received. She voiced understanding. Currently, authorization for the medication has not been initiated.   Ashlee L., per Dr. Charlett Blake can we perhaps develop a protocol to better assist [place] with staying updated on when authorization needs to be completed for a patient's injection every six months; discuss at the next nurses' meeting. Please see Vilma Prader, CMA as a resource; she mentioned an Lawyer through the [employer].

## 2015-07-01 NOTE — Telephone Encounter (Signed)
Please initiate authorization of Prolia and arrange for patient to come in for injection

## 2015-07-03 NOTE — Telephone Encounter (Signed)
Rose,  Could you begin the PA for this patients Prolia.  Let me know your findings. Thanks, Shirlean Mylar

## 2015-07-03 NOTE — Telephone Encounter (Signed)
I have electronically submitted pt's info for Prolia insurance verification and will notify you once I have a response. Thank you. °

## 2015-07-11 NOTE — Telephone Encounter (Signed)
I have rec'd Ms. Breeze's insurance verification for Prolia and she has an estimated responsibility of $0. Please make pt aware this is an estimate and we will not know and exact amt until insurance(s) has/have paid.  I have sent a copy of the summary of benefits to be scanned into pt's chart.  If you have any questions, please let me know.

## 2015-07-11 NOTE — Telephone Encounter (Signed)
Also, please send me the actual injection date once she rec's it.  Thank you.

## 2015-07-11 NOTE — Telephone Encounter (Signed)
Called the patient informed of cost of Prolia. The patient agreed to take and scheduled nurse visit appt. For 07/17/2015.  Also sent message to Gilmore Laroche to order her prolia

## 2015-07-17 ENCOUNTER — Ambulatory Visit (INDEPENDENT_AMBULATORY_CARE_PROVIDER_SITE_OTHER): Payer: Medicare Other | Admitting: Family Medicine

## 2015-07-17 DIAGNOSIS — M810 Age-related osteoporosis without current pathological fracture: Secondary | ICD-10-CM | POA: Diagnosis not present

## 2015-07-17 MED ORDER — DENOSUMAB 60 MG/ML ~~LOC~~ SOLN
60.0000 mg | Freq: Once | SUBCUTANEOUS | Status: AC
  Administered 2015-07-17: 60 mg via SUBCUTANEOUS

## 2015-07-17 NOTE — Telephone Encounter (Signed)
Rose, She has had her Prolia today 07/17/2015.   Thanks,

## 2015-07-17 NOTE — Progress Notes (Signed)
Pre visit review using our clinic review tool, if applicable. No additional management support is needed unless otherwise documented below in the visit note.  Patient in for Polia Injection. Given Left arm, tolerated well.

## 2015-07-17 NOTE — Progress Notes (Signed)
Patient tolerated injection.

## 2015-08-03 ENCOUNTER — Other Ambulatory Visit: Payer: Self-pay | Admitting: Family Medicine

## 2015-08-15 ENCOUNTER — Ambulatory Visit (INDEPENDENT_AMBULATORY_CARE_PROVIDER_SITE_OTHER): Payer: Medicare Other | Admitting: Family Medicine

## 2015-08-15 ENCOUNTER — Encounter: Payer: Self-pay | Admitting: Family Medicine

## 2015-08-15 VITALS — BP 120/76 | HR 88 | Temp 98.3°F | Ht <= 58 in | Wt 119.5 lb

## 2015-08-15 DIAGNOSIS — J349 Unspecified disorder of nose and nasal sinuses: Secondary | ICD-10-CM | POA: Diagnosis not present

## 2015-08-15 DIAGNOSIS — I10 Essential (primary) hypertension: Secondary | ICD-10-CM | POA: Diagnosis not present

## 2015-08-15 DIAGNOSIS — M25511 Pain in right shoulder: Secondary | ICD-10-CM

## 2015-08-15 DIAGNOSIS — Z1239 Encounter for other screening for malignant neoplasm of breast: Secondary | ICD-10-CM

## 2015-08-15 DIAGNOSIS — M545 Low back pain, unspecified: Secondary | ICD-10-CM

## 2015-08-15 MED ORDER — BACLOFEN 10 MG PO TABS: 10.0000 mg | ORAL_TABLET | Freq: Every evening | ORAL | Status: DC | PRN

## 2015-08-15 MED FILL — BACLOFEN 10 MG TABLET: 10 | 15 days supply | Qty: 15 | Fill #0

## 2015-08-15 NOTE — Progress Notes (Signed)
Subjective:    Patient ID: Jennifer Wilkins, female    DOB: 1932-07-10, 82 y.o.   MRN: [id number]  Chief Complaint  Patient presents with  . Sciatica    HPI Patient is in today for evaluation of low back pain with intermittent sciatica right greater than left off and on for roughly a month. She denies any falls or trauma. She denies any changes in bowel or bladder habits. No incontinence. Pain is actually significantly better today than it has been for most of this last month. She notes the stiffness and pain is worse in the morning and improves throughout the day. She has been using over-the-counter Aleve when necessary with decent results but pain returned. No other acute complaint. Denies CP/palp/SOB/HA/congestion/fevers/GI or GU c/o. Taking meds as prescribed  Past Medical History  Diagnosis Date  . Hypercholesteremia   . Arthritis   . HTN (hypertension)   . Hypocalcemia 06/19/2013  . Elevated BP 04/21/2014  . Stress reaction 04/25/2015  . Medicare annual wellness visit, subsequent 05/03/2015    Past Surgical History  Procedure Laterality Date  . Gallbladder surgery  1984    Gallstone removal    Family History  Problem Relation Age of Onset  . Osteoporosis Mother   . Ulcers Father   . Kidney disease Paternal Grandfather     Social History   Social History  . Marital Status: Married    Spouse Name: N/A  . Number of Children: N/A  . Years of Education: N/A   Occupational History  . Not on file.   Social History Main Topics  . Smoking status: Never Smoker   . Smokeless tobacco: Never Used  . Alcohol Use: No  . Drug Use: Not on file  . Sexual Activity: Not on file   Other Topics Concern  . Not on file   Social History Narrative    Outpatient Prescriptions Prior to Visit  Medication Sig Dispense Refill  . aspirin 81 MG tablet Take 81 mg by mouth daily.    . Calcium Carbonate-Vitamin D (CALCIUM 600/VITAMIN D) 600-400 MG-UNIT per tablet Take 1 tablet by mouth 2  (two) times daily.    . carvedilol (COREG) 12.5 MG tablet TAKE 1 TABLET DAILY 90 tablet 1  . cholecalciferol (VITAMIN D) 400 UNITS TABS Take 5,000 Units by mouth daily.     . Cyanocobalamin (VITAMIN B-12 CR) 1500 MCG TBCR Take by mouth daily.    . hydrochlorothiazide (MICROZIDE) 12.5 MG capsule TAKE 1 CAPSULE DAILY 90 capsule 1  . Misc Natural Products (OSTEO BI-FLEX ADV JOINT SHIELD PO) Take by mouth daily.    . simvastatin (ZOCOR) 40 MG tablet TAKE 1 TABLET EVERY EVENING 90 tablet 1  . zoster vaccine live, PF, (ZOSTAVAX) 16109 UNT/0.65ML injection Inject 19,400 Units into the skin once. 1 each 0   No facility-administered medications prior to visit.    No Known Allergies  Review of Systems  Constitutional: Negative for fever and malaise/fatigue.  HENT: Negative for congestion.   Eyes: Negative for discharge.  Respiratory: Negative for shortness of breath.   Cardiovascular: Negative for chest pain, palpitations and leg swelling.  Gastrointestinal: Negative for nausea and abdominal pain.  Genitourinary: Negative for dysuria.  Musculoskeletal: Positive for back pain and joint pain. Negative for falls.  Skin: Negative for rash.  Neurological: Negative for loss of consciousness and headaches.  Endo/Heme/Allergies: Negative for environmental allergies.  Psychiatric/Behavioral: Negative for depression. The patient is not nervous/anxious.  Objective:    Physical Exam  Constitutional: She is oriented to person, place, and time. She appears well-developed and well-nourished. No distress.  HENT:  Head: Normocephalic and atraumatic.  Nose: Nose normal.  Eyes: Right eye exhibits no discharge. Left eye exhibits no discharge.  Neck: Normal range of motion. Neck supple.  Cardiovascular: Normal rate and regular rhythm.   No murmur heard. Pulmonary/Chest: Effort normal and breath sounds normal.  Abdominal: Soft. Bowel sounds are normal. There is no tenderness.  Musculoskeletal: She  exhibits no edema or tenderness.  Neurological: She is alert and oriented to person, place, and time. She has normal reflexes. Coordination normal.  Skin: Skin is warm and dry.  Psychiatric: She has a normal mood and affect.  Nursing note and vitals reviewed.   BP 120/76 mmHg  Pulse 88  Temp(Src) 98.3 F (36.8 C) (Oral)  Ht 4\' 9"  (1.448 m)  Wt 119 lb 8 oz (54.205 kg)  BMI 25.85 kg/m2  SpO2 98% Wt Readings from Last 3 Encounters:  08/15/15 119 lb 8 oz (54.205 kg)  04/25/15 114 lb (51.71 kg)  10/14/14 113 lb 8 oz (51.483 kg)     Lab Results  Component Value Date   WBC 5.5 04/25/2015   HGB 14.9 04/25/2015   HCT 45.3 04/25/2015   PLT 208.0 04/25/2015   GLUCOSE 92 04/25/2015   CHOL 175 04/25/2015   TRIG 89.0 04/25/2015   HDL 90.50 04/25/2015   LDLCALC 67 04/25/2015   ALT 19 04/25/2015   AST 19 04/25/2015   NA 143 04/25/2015   K 4.4 04/25/2015   CL 104 04/25/2015   CREATININE 0.79 04/25/2015   BUN 20 04/25/2015   CO2 31 04/25/2015   TSH 1.74 04/25/2015    Lab Results  Component Value Date   TSH 1.74 04/25/2015   Lab Results  Component Value Date   WBC 5.5 04/25/2015   HGB 14.9 04/25/2015   HCT 45.3 04/25/2015   MCV 88.5 04/25/2015   PLT 208.0 04/25/2015   Lab Results  Component Value Date   NA 143 04/25/2015   K 4.4 04/25/2015   CO2 31 04/25/2015   GLUCOSE 92 04/25/2015   BUN 20 04/25/2015   CREATININE 0.79 04/25/2015   BILITOT 1.0 04/25/2015   ALKPHOS 56 04/25/2015   AST 19 04/25/2015   ALT 19 04/25/2015   PROT 7.1 04/25/2015   ALBUMIN 4.2 04/25/2015   CALCIUM 9.4 04/25/2015   GFR 74.10 04/25/2015   Lab Results  Component Value Date   CHOL 175 04/25/2015   Lab Results  Component Value Date   HDL 90.50 04/25/2015   Lab Results  Component Value Date   LDLCALC 67 04/25/2015   Lab Results  Component Value Date   TRIG 89.0 04/25/2015   Lab Results  Component Value Date   CHOLHDL 2 04/25/2015   No results found for: HGBA1C       Assessment & Plan:   Problem List Items Addressed This Visit    Bilateral low back pain without sciatica - Primary    Off and on for roughly one month. Encouraged moist heat, topical treatments and gentle stretching as tolerated. Given Baclofen for qhs use and has been using Naproxen sparingly with good results. She will report if symptoms worsen for further treatment and referral      Relevant Medications   baclofen (LIORESAL) 10 MG tablet   Essential hypertension    Well controlled, no changes to meds. Encouraged heart healthy diet such as  the DASH diet and exercise as tolerated.        Other Visit Diagnoses    Pain in joint of right shoulder        Breast screening        Sinus disease        Breast cancer screening           I am having Ms. Barich start on baclofen. I am also having her maintain her Vitamin B-12 CR, cholecalciferol, aspirin, Misc Natural Products (OSTEO BI-FLEX ADV JOINT SHIELD PO), Calcium Carbonate-Vitamin D, zoster vaccine live (PF), carvedilol, hydrochlorothiazide, and simvastatin.  Meds ordered this encounter  Medications  . baclofen (LIORESAL) 10 MG tablet    Sig: Take 1 tablet (10 mg total) by mouth at bedtime as needed for muscle spasms.    Dispense:  15 each    Refill:  [place], MD

## 2015-08-15 NOTE — Progress Notes (Signed)
Pre visit review using our clinic review tool, if applicable. No additional management support is needed unless otherwise documented below in the visit note. 

## 2015-08-15 NOTE — Patient Instructions (Addendum)
Salon Pas patches original or Lidocaine Or Aspercreme has Lidocaine  On a day with severe pain day can take Aleve 220 mg twice a day with food If pain worsens again try the Baclofen at bed and let me know so we can proceed with xray   Sciatica Sciatica is pain, weakness, numbness, or tingling along the path of the sciatic nerve. The nerve starts in the lower back and runs down the back of each leg. The nerve controls the muscles in the lower leg and in the back of the knee, while also providing sensation to the back of the thigh, lower leg, and the sole of your foot. Sciatica is a symptom of another medical condition. For instance, nerve damage or certain conditions, such as a herniated disk or bone spur on the spine, pinch or put pressure on the sciatic nerve. This causes the pain, weakness, or other sensations normally associated with sciatica. Generally, sciatica only affects one side of the body. CAUSES   Herniated or slipped disc.  Degenerative disk disease.  A pain disorder involving the narrow muscle in the buttocks (piriformis syndrome).  Pelvic injury or fracture.  Pregnancy.  Tumor (rare). SYMPTOMS  Symptoms can vary from mild to very severe. The symptoms usually travel from the low back to the buttocks and down the back of the leg. Symptoms can include:  Mild tingling or dull aches in the lower back, leg, or hip.  Numbness in the back of the calf or sole of the foot.  Burning sensations in the lower back, leg, or hip.  Sharp pains in the lower back, leg, or hip.  Leg weakness.  Severe back pain inhibiting movement. These symptoms may get worse with coughing, sneezing, laughing, or prolonged sitting or standing. Also, being overweight may worsen symptoms. DIAGNOSIS  Your caregiver will perform a physical exam to look for common symptoms of sciatica. He or she may ask you to do certain movements or activities that would trigger sciatic nerve pain. Other tests may be  performed to find the cause of the sciatica. These may include:  Blood tests.  X-rays.  Imaging tests, such as an MRI or CT scan. TREATMENT  Treatment is directed at the cause of the sciatic pain. Sometimes, treatment is not necessary and the pain and discomfort goes away on its own. If treatment is needed, your caregiver may suggest:  Over-the-counter medicines to relieve pain.  Prescription medicines, such as anti-inflammatory medicine, muscle relaxants, or narcotics.  Applying heat or ice to the painful area.  Steroid injections to lessen pain, irritation, and inflammation around the nerve.  Reducing activity during periods of pain.  Exercising and stretching to strengthen your abdomen and improve flexibility of your spine. Your caregiver may suggest losing weight if the extra weight makes the back pain worse.  Physical therapy.  Surgery to eliminate what is pressing or pinching the nerve, such as a bone spur or part of a herniated disk. HOME CARE INSTRUCTIONS   Only take over-the-counter or prescription medicines for pain or discomfort as directed by your caregiver.  Apply ice to the affected area for 20 minutes, 3-4 times a day for the first 48-72 hours. Then try heat in the same way.  Exercise, stretch, or perform your usual activities if these do not aggravate your pain.  Attend physical therapy sessions as directed by your caregiver.  Keep all follow-up appointments as directed by your caregiver.  Do not wear high heels or shoes that do not provide  proper support.  Check your mattress to see if it is too soft. A firm mattress may lessen your pain and discomfort. SEEK IMMEDIATE MEDICAL CARE IF:   You lose control of your bowel or bladder (incontinence).  You have increasing weakness in the lower back, pelvis, buttocks, or legs.  You have redness or swelling of your back.  You have a burning sensation when you urinate.  You have pain that gets worse when you  lie down or awakens you at night.  Your pain is worse than you have experienced in the past.  Your pain is lasting longer than 4 weeks.  You are suddenly losing weight without reason. MAKE SURE YOU:  Understand these instructions.  Will watch your condition.  Will get help right away if you are not doing well or get worse.   This information is not intended to replace advice given to you by your health care provider. Make sure you discuss any questions you have with your health care provider.   Document Released: 07/09/2001 Document Revised: 04/05/2015 Document Reviewed: 11/24/2011 Elsevier Interactive Patient Education [employer].

## 2015-08-27 ENCOUNTER — Encounter: Payer: Self-pay | Admitting: Family Medicine

## 2015-08-27 DIAGNOSIS — M79604 Pain in right leg: Secondary | ICD-10-CM

## 2015-08-27 DIAGNOSIS — M545 Low back pain, unspecified: Secondary | ICD-10-CM | POA: Insufficient documentation

## 2015-08-27 DIAGNOSIS — M79605 Pain in left leg: Secondary | ICD-10-CM | POA: Insufficient documentation

## 2015-08-27 HISTORY — DX: Pain in right leg: M79.604

## 2015-08-27 NOTE — Assessment & Plan Note (Signed)
Well controlled, no changes to meds. Encouraged heart healthy diet such as the DASH diet and exercise as tolerated.  °

## 2015-08-27 NOTE — Assessment & Plan Note (Signed)
Off and on for roughly one month. Encouraged moist heat, topical treatments and gentle stretching as tolerated. Given Baclofen for qhs use and has been using Naproxen sparingly with good results. She will report if symptoms worsen for further treatment and referral

## 2015-09-08 ENCOUNTER — Telehealth: Payer: Self-pay | Admitting: Family Medicine

## 2015-09-08 DIAGNOSIS — M5441 Lumbago with sciatica, right side: Secondary | ICD-10-CM

## 2015-09-08 NOTE — Telephone Encounter (Signed)
Perfect

## 2015-09-08 NOTE — Telephone Encounter (Signed)
Sent over order for pt to [facility].

## 2015-09-08 NOTE — Telephone Encounter (Signed)
Caller name: Self  Can be reached: [phone]   Reason for call: Patient is requesting a referral to have therapy for the sciatic nerve pain she is having

## 2015-09-08 NOTE — Telephone Encounter (Signed)
Sent refferal for sports medicine for patient. Please call notify patient.

## 2015-09-08 NOTE — Telephone Encounter (Signed)
Called the patient and she does not want an appointment to be made.  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  She states at [facility] they have Physical Therapist and would like an order for therapy at [facility].

## 2015-09-14 DIAGNOSIS — R262 Difficulty in walking, not elsewhere classified: Secondary | ICD-10-CM | POA: Diagnosis not present

## 2015-09-14 DIAGNOSIS — M6281 Muscle weakness (generalized): Secondary | ICD-10-CM | POA: Diagnosis not present

## 2015-09-14 DIAGNOSIS — M545 Low back pain: Secondary | ICD-10-CM | POA: Diagnosis not present

## 2015-09-14 DIAGNOSIS — M5442 Lumbago with sciatica, left side: Secondary | ICD-10-CM | POA: Diagnosis not present

## 2015-09-14 DIAGNOSIS — M5441 Lumbago with sciatica, right side: Secondary | ICD-10-CM | POA: Diagnosis not present

## 2015-09-18 ENCOUNTER — Telehealth: Payer: Self-pay | Admitting: *Deleted

## 2015-09-18 DIAGNOSIS — M5441 Lumbago with sciatica, right side: Secondary | ICD-10-CM | POA: Diagnosis not present

## 2015-09-18 DIAGNOSIS — M5442 Lumbago with sciatica, left side: Secondary | ICD-10-CM | POA: Diagnosis not present

## 2015-09-18 DIAGNOSIS — M545 Low back pain: Secondary | ICD-10-CM | POA: Diagnosis not present

## 2015-09-18 DIAGNOSIS — R262 Difficulty in walking, not elsewhere classified: Secondary | ICD-10-CM | POA: Diagnosis not present

## 2015-09-18 DIAGNOSIS — M6281 Muscle weakness (generalized): Secondary | ICD-10-CM | POA: Diagnosis not present

## 2015-09-18 NOTE — Telephone Encounter (Signed)
Received PT Plan of Care Therapy Clarification Order; forwarded to provider/SLS 02/20

## 2015-09-20 DIAGNOSIS — R262 Difficulty in walking, not elsewhere classified: Secondary | ICD-10-CM | POA: Diagnosis not present

## 2015-09-20 DIAGNOSIS — M6281 Muscle weakness (generalized): Secondary | ICD-10-CM | POA: Diagnosis not present

## 2015-09-20 DIAGNOSIS — M545 Low back pain: Secondary | ICD-10-CM | POA: Diagnosis not present

## 2015-09-20 DIAGNOSIS — M5442 Lumbago with sciatica, left side: Secondary | ICD-10-CM | POA: Diagnosis not present

## 2015-09-20 DIAGNOSIS — M5441 Lumbago with sciatica, right side: Secondary | ICD-10-CM | POA: Diagnosis not present

## 2015-09-22 DIAGNOSIS — M6281 Muscle weakness (generalized): Secondary | ICD-10-CM | POA: Diagnosis not present

## 2015-09-22 DIAGNOSIS — M545 Low back pain: Secondary | ICD-10-CM | POA: Diagnosis not present

## 2015-09-22 DIAGNOSIS — R262 Difficulty in walking, not elsewhere classified: Secondary | ICD-10-CM | POA: Diagnosis not present

## 2015-09-22 DIAGNOSIS — M5442 Lumbago with sciatica, left side: Secondary | ICD-10-CM | POA: Diagnosis not present

## 2015-09-22 DIAGNOSIS — M5441 Lumbago with sciatica, right side: Secondary | ICD-10-CM | POA: Diagnosis not present

## 2015-09-25 DIAGNOSIS — M5442 Lumbago with sciatica, left side: Secondary | ICD-10-CM | POA: Diagnosis not present

## 2015-09-25 DIAGNOSIS — M545 Low back pain: Secondary | ICD-10-CM | POA: Diagnosis not present

## 2015-09-25 DIAGNOSIS — M6281 Muscle weakness (generalized): Secondary | ICD-10-CM | POA: Diagnosis not present

## 2015-09-25 DIAGNOSIS — R262 Difficulty in walking, not elsewhere classified: Secondary | ICD-10-CM | POA: Diagnosis not present

## 2015-09-25 DIAGNOSIS — M5441 Lumbago with sciatica, right side: Secondary | ICD-10-CM | POA: Diagnosis not present

## 2015-09-26 DIAGNOSIS — R262 Difficulty in walking, not elsewhere classified: Secondary | ICD-10-CM | POA: Diagnosis not present

## 2015-09-26 DIAGNOSIS — M545 Low back pain: Secondary | ICD-10-CM | POA: Diagnosis not present

## 2015-09-26 DIAGNOSIS — M5442 Lumbago with sciatica, left side: Secondary | ICD-10-CM | POA: Diagnosis not present

## 2015-09-26 DIAGNOSIS — M6281 Muscle weakness (generalized): Secondary | ICD-10-CM | POA: Diagnosis not present

## 2015-09-26 DIAGNOSIS — M5441 Lumbago with sciatica, right side: Secondary | ICD-10-CM | POA: Diagnosis not present

## 2015-09-29 DIAGNOSIS — M6281 Muscle weakness (generalized): Secondary | ICD-10-CM | POA: Diagnosis not present

## 2015-09-29 DIAGNOSIS — M5441 Lumbago with sciatica, right side: Secondary | ICD-10-CM | POA: Diagnosis not present

## 2015-09-29 DIAGNOSIS — R262 Difficulty in walking, not elsewhere classified: Secondary | ICD-10-CM | POA: Diagnosis not present

## 2015-09-29 DIAGNOSIS — M5442 Lumbago with sciatica, left side: Secondary | ICD-10-CM | POA: Diagnosis not present

## 2015-09-29 DIAGNOSIS — M545 Low back pain: Secondary | ICD-10-CM | POA: Diagnosis not present

## 2015-10-02 DIAGNOSIS — R262 Difficulty in walking, not elsewhere classified: Secondary | ICD-10-CM | POA: Diagnosis not present

## 2015-10-02 DIAGNOSIS — M6281 Muscle weakness (generalized): Secondary | ICD-10-CM | POA: Diagnosis not present

## 2015-10-02 DIAGNOSIS — M5441 Lumbago with sciatica, right side: Secondary | ICD-10-CM | POA: Diagnosis not present

## 2015-10-02 DIAGNOSIS — M5442 Lumbago with sciatica, left side: Secondary | ICD-10-CM | POA: Diagnosis not present

## 2015-10-02 DIAGNOSIS — M545 Low back pain: Secondary | ICD-10-CM | POA: Diagnosis not present

## 2015-10-04 DIAGNOSIS — M5441 Lumbago with sciatica, right side: Secondary | ICD-10-CM | POA: Diagnosis not present

## 2015-10-04 DIAGNOSIS — M6281 Muscle weakness (generalized): Secondary | ICD-10-CM | POA: Diagnosis not present

## 2015-10-04 DIAGNOSIS — R262 Difficulty in walking, not elsewhere classified: Secondary | ICD-10-CM | POA: Diagnosis not present

## 2015-10-04 DIAGNOSIS — M545 Low back pain: Secondary | ICD-10-CM | POA: Diagnosis not present

## 2015-10-04 DIAGNOSIS — M5442 Lumbago with sciatica, left side: Secondary | ICD-10-CM | POA: Diagnosis not present

## 2015-10-06 DIAGNOSIS — M6281 Muscle weakness (generalized): Secondary | ICD-10-CM | POA: Diagnosis not present

## 2015-10-06 DIAGNOSIS — M545 Low back pain: Secondary | ICD-10-CM | POA: Diagnosis not present

## 2015-10-06 DIAGNOSIS — R262 Difficulty in walking, not elsewhere classified: Secondary | ICD-10-CM | POA: Diagnosis not present

## 2015-10-06 DIAGNOSIS — M5441 Lumbago with sciatica, right side: Secondary | ICD-10-CM | POA: Diagnosis not present

## 2015-10-06 DIAGNOSIS — M5442 Lumbago with sciatica, left side: Secondary | ICD-10-CM | POA: Diagnosis not present

## 2015-10-09 DIAGNOSIS — M545 Low back pain: Secondary | ICD-10-CM | POA: Diagnosis not present

## 2015-10-09 DIAGNOSIS — M5442 Lumbago with sciatica, left side: Secondary | ICD-10-CM | POA: Diagnosis not present

## 2015-10-09 DIAGNOSIS — M6281 Muscle weakness (generalized): Secondary | ICD-10-CM | POA: Diagnosis not present

## 2015-10-09 DIAGNOSIS — R262 Difficulty in walking, not elsewhere classified: Secondary | ICD-10-CM | POA: Diagnosis not present

## 2015-10-09 DIAGNOSIS — M5441 Lumbago with sciatica, right side: Secondary | ICD-10-CM | POA: Diagnosis not present

## 2015-10-11 DIAGNOSIS — M6281 Muscle weakness (generalized): Secondary | ICD-10-CM | POA: Diagnosis not present

## 2015-10-11 DIAGNOSIS — M545 Low back pain: Secondary | ICD-10-CM | POA: Diagnosis not present

## 2015-10-11 DIAGNOSIS — M5441 Lumbago with sciatica, right side: Secondary | ICD-10-CM | POA: Diagnosis not present

## 2015-10-11 DIAGNOSIS — R262 Difficulty in walking, not elsewhere classified: Secondary | ICD-10-CM | POA: Diagnosis not present

## 2015-10-11 DIAGNOSIS — M5442 Lumbago with sciatica, left side: Secondary | ICD-10-CM | POA: Diagnosis not present

## 2015-10-13 DIAGNOSIS — M5442 Lumbago with sciatica, left side: Secondary | ICD-10-CM | POA: Diagnosis not present

## 2015-10-13 DIAGNOSIS — M6281 Muscle weakness (generalized): Secondary | ICD-10-CM | POA: Diagnosis not present

## 2015-10-13 DIAGNOSIS — M545 Low back pain: Secondary | ICD-10-CM | POA: Diagnosis not present

## 2015-10-13 DIAGNOSIS — M5441 Lumbago with sciatica, right side: Secondary | ICD-10-CM | POA: Diagnosis not present

## 2015-10-13 DIAGNOSIS — R262 Difficulty in walking, not elsewhere classified: Secondary | ICD-10-CM | POA: Diagnosis not present

## 2015-10-16 ENCOUNTER — Ambulatory Visit: Payer: Medicare Other | Admitting: Family Medicine

## 2015-10-16 DIAGNOSIS — M5442 Lumbago with sciatica, left side: Secondary | ICD-10-CM | POA: Diagnosis not present

## 2015-10-16 DIAGNOSIS — M545 Low back pain: Secondary | ICD-10-CM | POA: Diagnosis not present

## 2015-10-16 DIAGNOSIS — M6281 Muscle weakness (generalized): Secondary | ICD-10-CM | POA: Diagnosis not present

## 2015-10-16 DIAGNOSIS — R262 Difficulty in walking, not elsewhere classified: Secondary | ICD-10-CM | POA: Diagnosis not present

## 2015-10-16 DIAGNOSIS — M5441 Lumbago with sciatica, right side: Secondary | ICD-10-CM | POA: Diagnosis not present

## 2015-10-18 DIAGNOSIS — M6281 Muscle weakness (generalized): Secondary | ICD-10-CM | POA: Diagnosis not present

## 2015-10-18 DIAGNOSIS — M5442 Lumbago with sciatica, left side: Secondary | ICD-10-CM | POA: Diagnosis not present

## 2015-10-18 DIAGNOSIS — M5441 Lumbago with sciatica, right side: Secondary | ICD-10-CM | POA: Diagnosis not present

## 2015-10-18 DIAGNOSIS — R262 Difficulty in walking, not elsewhere classified: Secondary | ICD-10-CM | POA: Diagnosis not present

## 2015-10-18 DIAGNOSIS — M545 Low back pain: Secondary | ICD-10-CM | POA: Diagnosis not present

## 2015-10-19 DIAGNOSIS — M5441 Lumbago with sciatica, right side: Secondary | ICD-10-CM | POA: Diagnosis not present

## 2015-10-19 DIAGNOSIS — R262 Difficulty in walking, not elsewhere classified: Secondary | ICD-10-CM | POA: Diagnosis not present

## 2015-10-19 DIAGNOSIS — M545 Low back pain: Secondary | ICD-10-CM | POA: Diagnosis not present

## 2015-10-19 DIAGNOSIS — M6281 Muscle weakness (generalized): Secondary | ICD-10-CM | POA: Diagnosis not present

## 2015-10-19 DIAGNOSIS — M5442 Lumbago with sciatica, left side: Secondary | ICD-10-CM | POA: Diagnosis not present

## 2015-10-23 ENCOUNTER — Telehealth: Payer: Self-pay | Admitting: *Deleted

## 2015-10-23 DIAGNOSIS — M5441 Lumbago with sciatica, right side: Secondary | ICD-10-CM | POA: Diagnosis not present

## 2015-10-23 DIAGNOSIS — M545 Low back pain: Secondary | ICD-10-CM | POA: Diagnosis not present

## 2015-10-23 DIAGNOSIS — M6281 Muscle weakness (generalized): Secondary | ICD-10-CM | POA: Diagnosis not present

## 2015-10-23 DIAGNOSIS — R262 Difficulty in walking, not elsewhere classified: Secondary | ICD-10-CM | POA: Diagnosis not present

## 2015-10-23 DIAGNOSIS — M5442 Lumbago with sciatica, left side: Secondary | ICD-10-CM | POA: Diagnosis not present

## 2015-10-23 NOTE — Telephone Encounter (Signed)
Received PT Plan of Care & Update; forwarded to provider/SLS 03/27

## 2015-10-24 DIAGNOSIS — M6281 Muscle weakness (generalized): Secondary | ICD-10-CM | POA: Diagnosis not present

## 2015-10-24 DIAGNOSIS — M545 Low back pain: Secondary | ICD-10-CM | POA: Diagnosis not present

## 2015-10-24 DIAGNOSIS — R262 Difficulty in walking, not elsewhere classified: Secondary | ICD-10-CM | POA: Diagnosis not present

## 2015-10-24 DIAGNOSIS — M5441 Lumbago with sciatica, right side: Secondary | ICD-10-CM | POA: Diagnosis not present

## 2015-10-24 DIAGNOSIS — M5442 Lumbago with sciatica, left side: Secondary | ICD-10-CM | POA: Diagnosis not present

## 2015-10-27 DIAGNOSIS — R262 Difficulty in walking, not elsewhere classified: Secondary | ICD-10-CM | POA: Diagnosis not present

## 2015-10-27 DIAGNOSIS — M545 Low back pain: Secondary | ICD-10-CM | POA: Diagnosis not present

## 2015-10-27 DIAGNOSIS — M6281 Muscle weakness (generalized): Secondary | ICD-10-CM | POA: Diagnosis not present

## 2015-10-27 DIAGNOSIS — M5442 Lumbago with sciatica, left side: Secondary | ICD-10-CM | POA: Diagnosis not present

## 2015-10-27 DIAGNOSIS — M5441 Lumbago with sciatica, right side: Secondary | ICD-10-CM | POA: Diagnosis not present

## 2015-10-30 DIAGNOSIS — M5441 Lumbago with sciatica, right side: Secondary | ICD-10-CM | POA: Diagnosis not present

## 2015-10-30 DIAGNOSIS — M6281 Muscle weakness (generalized): Secondary | ICD-10-CM | POA: Diagnosis not present

## 2015-10-30 DIAGNOSIS — M5442 Lumbago with sciatica, left side: Secondary | ICD-10-CM | POA: Diagnosis not present

## 2015-10-30 DIAGNOSIS — M545 Low back pain: Secondary | ICD-10-CM | POA: Diagnosis not present

## 2015-10-30 DIAGNOSIS — R262 Difficulty in walking, not elsewhere classified: Secondary | ICD-10-CM | POA: Diagnosis not present

## 2015-11-01 DIAGNOSIS — M6281 Muscle weakness (generalized): Secondary | ICD-10-CM | POA: Diagnosis not present

## 2015-11-01 DIAGNOSIS — M545 Low back pain: Secondary | ICD-10-CM | POA: Diagnosis not present

## 2015-11-01 DIAGNOSIS — M5441 Lumbago with sciatica, right side: Secondary | ICD-10-CM | POA: Diagnosis not present

## 2015-11-01 DIAGNOSIS — M5442 Lumbago with sciatica, left side: Secondary | ICD-10-CM | POA: Diagnosis not present

## 2015-11-01 DIAGNOSIS — R262 Difficulty in walking, not elsewhere classified: Secondary | ICD-10-CM | POA: Diagnosis not present

## 2015-11-03 DIAGNOSIS — M5442 Lumbago with sciatica, left side: Secondary | ICD-10-CM | POA: Diagnosis not present

## 2015-11-03 DIAGNOSIS — M545 Low back pain: Secondary | ICD-10-CM | POA: Diagnosis not present

## 2015-11-03 DIAGNOSIS — R262 Difficulty in walking, not elsewhere classified: Secondary | ICD-10-CM | POA: Diagnosis not present

## 2015-11-03 DIAGNOSIS — M5441 Lumbago with sciatica, right side: Secondary | ICD-10-CM | POA: Diagnosis not present

## 2015-11-03 DIAGNOSIS — M6281 Muscle weakness (generalized): Secondary | ICD-10-CM | POA: Diagnosis not present

## 2015-11-07 DIAGNOSIS — M545 Low back pain: Secondary | ICD-10-CM | POA: Diagnosis not present

## 2015-11-07 DIAGNOSIS — M6281 Muscle weakness (generalized): Secondary | ICD-10-CM | POA: Diagnosis not present

## 2015-11-07 DIAGNOSIS — R262 Difficulty in walking, not elsewhere classified: Secondary | ICD-10-CM | POA: Diagnosis not present

## 2015-11-07 DIAGNOSIS — M5442 Lumbago with sciatica, left side: Secondary | ICD-10-CM | POA: Diagnosis not present

## 2015-11-07 DIAGNOSIS — M5441 Lumbago with sciatica, right side: Secondary | ICD-10-CM | POA: Diagnosis not present

## 2015-11-08 DIAGNOSIS — M545 Low back pain: Secondary | ICD-10-CM | POA: Diagnosis not present

## 2015-11-08 DIAGNOSIS — M6281 Muscle weakness (generalized): Secondary | ICD-10-CM | POA: Diagnosis not present

## 2015-11-08 DIAGNOSIS — M5442 Lumbago with sciatica, left side: Secondary | ICD-10-CM | POA: Diagnosis not present

## 2015-11-08 DIAGNOSIS — R262 Difficulty in walking, not elsewhere classified: Secondary | ICD-10-CM | POA: Diagnosis not present

## 2015-11-08 DIAGNOSIS — M5441 Lumbago with sciatica, right side: Secondary | ICD-10-CM | POA: Diagnosis not present

## 2015-11-10 DIAGNOSIS — M5441 Lumbago with sciatica, right side: Secondary | ICD-10-CM | POA: Diagnosis not present

## 2015-11-10 DIAGNOSIS — M545 Low back pain: Secondary | ICD-10-CM | POA: Diagnosis not present

## 2015-11-10 DIAGNOSIS — R262 Difficulty in walking, not elsewhere classified: Secondary | ICD-10-CM | POA: Diagnosis not present

## 2015-11-10 DIAGNOSIS — M6281 Muscle weakness (generalized): Secondary | ICD-10-CM | POA: Diagnosis not present

## 2015-11-10 DIAGNOSIS — M5442 Lumbago with sciatica, left side: Secondary | ICD-10-CM | POA: Diagnosis not present

## 2015-11-13 DIAGNOSIS — M5442 Lumbago with sciatica, left side: Secondary | ICD-10-CM | POA: Diagnosis not present

## 2015-11-13 DIAGNOSIS — M6281 Muscle weakness (generalized): Secondary | ICD-10-CM | POA: Diagnosis not present

## 2015-11-13 DIAGNOSIS — R262 Difficulty in walking, not elsewhere classified: Secondary | ICD-10-CM | POA: Diagnosis not present

## 2015-11-13 DIAGNOSIS — M5441 Lumbago with sciatica, right side: Secondary | ICD-10-CM | POA: Diagnosis not present

## 2015-11-13 DIAGNOSIS — M545 Low back pain: Secondary | ICD-10-CM | POA: Diagnosis not present

## 2015-11-14 DIAGNOSIS — M6281 Muscle weakness (generalized): Secondary | ICD-10-CM | POA: Diagnosis not present

## 2015-11-14 DIAGNOSIS — R262 Difficulty in walking, not elsewhere classified: Secondary | ICD-10-CM | POA: Diagnosis not present

## 2015-11-14 DIAGNOSIS — M5442 Lumbago with sciatica, left side: Secondary | ICD-10-CM | POA: Diagnosis not present

## 2015-11-14 DIAGNOSIS — M5441 Lumbago with sciatica, right side: Secondary | ICD-10-CM | POA: Diagnosis not present

## 2015-11-14 DIAGNOSIS — M545 Low back pain: Secondary | ICD-10-CM | POA: Diagnosis not present

## 2015-11-16 DIAGNOSIS — R262 Difficulty in walking, not elsewhere classified: Secondary | ICD-10-CM | POA: Diagnosis not present

## 2015-11-16 DIAGNOSIS — M6281 Muscle weakness (generalized): Secondary | ICD-10-CM | POA: Diagnosis not present

## 2015-11-16 DIAGNOSIS — M5441 Lumbago with sciatica, right side: Secondary | ICD-10-CM | POA: Diagnosis not present

## 2015-11-16 DIAGNOSIS — M545 Low back pain: Secondary | ICD-10-CM | POA: Diagnosis not present

## 2015-11-16 DIAGNOSIS — M5442 Lumbago with sciatica, left side: Secondary | ICD-10-CM | POA: Diagnosis not present

## 2015-11-20 DIAGNOSIS — R262 Difficulty in walking, not elsewhere classified: Secondary | ICD-10-CM | POA: Diagnosis not present

## 2015-11-20 DIAGNOSIS — M545 Low back pain: Secondary | ICD-10-CM | POA: Diagnosis not present

## 2015-11-20 DIAGNOSIS — M5441 Lumbago with sciatica, right side: Secondary | ICD-10-CM | POA: Diagnosis not present

## 2015-11-20 DIAGNOSIS — M6281 Muscle weakness (generalized): Secondary | ICD-10-CM | POA: Diagnosis not present

## 2015-11-20 DIAGNOSIS — M5442 Lumbago with sciatica, left side: Secondary | ICD-10-CM | POA: Diagnosis not present

## 2015-11-21 ENCOUNTER — Telehealth: Payer: Self-pay | Admitting: Family Medicine

## 2015-11-21 ENCOUNTER — Encounter: Payer: Self-pay | Admitting: Family Medicine

## 2015-11-21 ENCOUNTER — Ambulatory Visit (INDEPENDENT_AMBULATORY_CARE_PROVIDER_SITE_OTHER): Payer: Medicare Other | Admitting: Family Medicine

## 2015-11-21 VITALS — BP 110/82 | HR 104 | Temp 97.9°F | Ht <= 58 in | Wt 112.1 lb

## 2015-11-21 DIAGNOSIS — E782 Mixed hyperlipidemia: Secondary | ICD-10-CM | POA: Diagnosis not present

## 2015-11-21 DIAGNOSIS — I10 Essential (primary) hypertension: Secondary | ICD-10-CM

## 2015-11-21 DIAGNOSIS — E559 Vitamin D deficiency, unspecified: Secondary | ICD-10-CM

## 2015-11-21 DIAGNOSIS — M810 Age-related osteoporosis without current pathological fracture: Secondary | ICD-10-CM | POA: Diagnosis not present

## 2015-11-21 DIAGNOSIS — Z0000 Encounter for general adult medical examination without abnormal findings: Secondary | ICD-10-CM | POA: Diagnosis not present

## 2015-11-21 NOTE — Patient Instructions (Addendum)
Salonpas with lidocaine.  Curcumin Supplement.   Sciatica Sciatica is pain, weakness, numbness, or tingling along the path of the sciatic nerve. The nerve starts in the lower back and runs down the back of each leg. The nerve controls the muscles in the lower leg and in the back of the knee, while also providing sensation to the back of the thigh, lower leg, and the sole of your foot. Sciatica is a symptom of another medical condition. For instance, nerve damage or certain conditions, such as a herniated disk or bone spur on the spine, pinch or put pressure on the sciatic nerve. This causes the pain, weakness, or other sensations normally associated with sciatica. Generally, sciatica only affects one side of the body. CAUSES   Herniated or slipped disc.  Degenerative disk disease.  A pain disorder involving the narrow muscle in the buttocks (piriformis syndrome).  Pelvic injury or fracture.  Pregnancy.  Tumor (rare). SYMPTOMS  Symptoms can vary from mild to very severe. The symptoms usually travel from the low back to the buttocks and down the back of the leg. Symptoms can include:  Mild tingling or dull aches in the lower back, leg, or hip.  Numbness in the back of the calf or sole of the foot.  Burning sensations in the lower back, leg, or hip.  Sharp pains in the lower back, leg, or hip.  Leg weakness.  Severe back pain inhibiting movement. These symptoms may get worse with coughing, sneezing, laughing, or prolonged sitting or standing. Also, being overweight may worsen symptoms. DIAGNOSIS  Your caregiver will perform a physical exam to look for common symptoms of sciatica. He or she may ask you to do certain movements or activities that would trigger sciatic nerve pain. Other tests may be performed to find the cause of the sciatica. These may include:  Blood tests.  X-rays.  Imaging tests, such as an MRI or CT scan. TREATMENT  Treatment is directed at the cause of the  sciatic pain. Sometimes, treatment is not necessary and the pain and discomfort goes away on its own. If treatment is needed, your caregiver may suggest:  Over-the-counter medicines to relieve pain.  Prescription medicines, such as anti-inflammatory medicine, muscle relaxants, or narcotics.  Applying heat or ice to the painful area.  Steroid injections to lessen pain, irritation, and inflammation around the nerve.  Reducing activity during periods of pain.  Exercising and stretching to strengthen your abdomen and improve flexibility of your spine. Your caregiver may suggest losing weight if the extra weight makes the back pain worse.  Physical therapy.  Surgery to eliminate what is pressing or pinching the nerve, such as a bone spur or part of a herniated disk. HOME CARE INSTRUCTIONS   Only take over-the-counter or prescription medicines for pain or discomfort as directed by your caregiver.  Apply ice to the affected area for 20 minutes, 3-4 times a day for the first 48-72 hours. Then try heat in the same way.  Exercise, stretch, or perform your usual activities if these do not aggravate your pain.  Attend physical therapy sessions as directed by your caregiver.  Keep all follow-up appointments as directed by your caregiver.  Do not wear high heels or shoes that do not provide proper support.  Check your mattress to see if it is too soft. A firm mattress may lessen your pain and discomfort. SEEK IMMEDIATE MEDICAL CARE IF:   You lose control of your bowel or bladder (incontinence).  You have  increasing weakness in the lower back, pelvis, buttocks, or legs.  You have redness or swelling of your back.  You have a burning sensation when you urinate.  You have pain that gets worse when you lie down or awakens you at night.  Your pain is worse than you have experienced in the past.  Your pain is lasting longer than 4 weeks.  You are suddenly losing weight without  reason. MAKE SURE YOU:  Understand these instructions.  Will watch your condition.  Will get help right away if you are not doing well or get worse.   This information is not intended to replace advice given to you by your health care provider. Make sure you discuss any questions you have with your health care provider.   Document Released: 07/09/2001 Document Revised: 04/05/2015 Document Reviewed: 11/24/2011 Elsevier Interactive Patient Education [employer].

## 2015-11-21 NOTE — Telephone Encounter (Signed)
At check out daughter Jennifer Wilkins wanted to schedule parents CPE . She would like to have them both seen together if possible. Could PCP please advise for scheduling.   Jennifer Wilkins [phone]

## 2015-11-21 NOTE — Telephone Encounter (Signed)
We can use spaces creatively if need be. We should try and put their appointments together

## 2015-11-21 NOTE — Progress Notes (Signed)
Subjective:    Patient ID: Jennifer Wilkins, female    DOB: Mar 26, 1932, 82 y.o.   MRN: [id number]  Chief Complaint  Patient presents with  . Follow-up    HPI Patient is in today for follow up. Patient reports sciatica in both legs and has been ongoing for months now but is improving with physical therapy also has been managing with nerve medication.   Patient has stopped taking the vitamin d supplement but has been taking a calcium that has some vitamin d in it also reports having a hearing aid. Denies CP/palp/SOB/HA/congestion/fevers/GI or GU c/o. Taking meds as prescribed.   Past Medical History  Diagnosis Date  . Hypercholesteremia   . Arthritis   . HTN (hypertension)   . Hypocalcemia 06/19/2013  . Elevated BP 04/21/2014  . Stress reaction 04/25/2015  . Medicare annual wellness visit, subsequent 05/03/2015    Past Surgical History  Procedure Laterality Date  . Gallbladder surgery  1984    Gallstone removal    Family History  Problem Relation Age of Onset  . Osteoporosis Mother   . Ulcers Father   . Kidney disease Paternal Grandfather     Social History   Social History  . Marital Status: Married    Spouse Name: N/A  . Number of Children: N/A  . Years of Education: N/A   Occupational History  . Not on file.   Social History Main Topics  . Smoking status: Never Smoker   . Smokeless tobacco: Never Used  . Alcohol Use: No  . Drug Use: Not on file  . Sexual Activity: Not on file   Other Topics Concern  . Not on file   Social History Narrative    Outpatient Prescriptions Prior to Visit  Medication Sig Dispense Refill  . aspirin 81 MG tablet Take 81 mg by mouth daily.    . Calcium Carbonate-Vitamin D (CALCIUM 600/VITAMIN D) 600-400 MG-UNIT per tablet Take 1 tablet by mouth 2 (two) times daily.    . carvedilol (COREG) 12.5 MG tablet TAKE 1 TABLET DAILY 90 tablet 1  . Cyanocobalamin (VITAMIN B-12 CR) 1500 MCG TBCR Take by mouth daily.    . hydrochlorothiazide  (MICROZIDE) 12.5 MG capsule TAKE 1 CAPSULE DAILY 90 capsule 1  . Misc Natural Products (OSTEO BI-FLEX ADV JOINT SHIELD PO) Take by mouth daily.    . simvastatin (ZOCOR) 40 MG tablet TAKE 1 TABLET EVERY EVENING 90 tablet 1  . baclofen (LIORESAL) 10 MG tablet Take 1 tablet (10 mg total) by mouth at bedtime as needed for muscle spasms. 15 each 1  . cholecalciferol (VITAMIN D) 400 UNITS TABS Take 5,000 Units by mouth daily. Reported on 11/21/2015    . zoster vaccine live, PF, (ZOSTAVAX) 40981 UNT/0.65ML injection Inject 19,400 Units into the skin once. 1 each 0   No facility-administered medications prior to visit.    No Known Allergies  Review of Systems  Constitutional: Negative for fever and malaise/fatigue.  HENT: Negative for congestion.   Eyes: Negative for blurred vision.  Respiratory: Negative for shortness of breath.   Cardiovascular: Negative for chest pain, palpitations and leg swelling.  Gastrointestinal: Negative for nausea, abdominal pain and blood in stool.  Genitourinary: Negative for dysuria and frequency.  Musculoskeletal: Negative for falls.  Skin: Negative for rash.  Neurological: Negative for dizziness, loss of consciousness and headaches.  Endo/Heme/Allergies: Negative for environmental allergies.  Psychiatric/Behavioral: Negative for depression. The patient is not nervous/anxious.        Objective:  Physical Exam  Constitutional: She is oriented to person, place, and time. She appears well-developed and well-nourished. No distress.  HENT:  Head: Normocephalic and atraumatic.  Eyes: Conjunctivae are normal.  Neck: Neck supple. No thyromegaly present.  Cardiovascular: Normal rate, regular rhythm and normal heart sounds.   No murmur heard. Pulmonary/Chest: Effort normal and breath sounds normal. No respiratory distress.  Abdominal: Soft. Bowel sounds are normal. She exhibits no distension and no mass. There is no tenderness.  Musculoskeletal: She exhibits no  edema.  Lymphadenopathy:    She has no cervical adenopathy.  Neurological: She is alert and oriented to person, place, and time.  Skin: Skin is warm and dry.  Psychiatric: She has a normal mood and affect. Her behavior is normal.    BP 110/82 mmHg  Pulse 104  Temp(Src) 97.9 F (36.6 C) (Oral)  Ht 4\' 7"  (1.397 m)  Wt 112 lb 2 oz (50.86 kg)  BMI 26.06 kg/m2  SpO2 97% Wt Readings from Last 3 Encounters:  11/21/15 112 lb 2 oz (50.86 kg)  08/15/15 119 lb 8 oz (54.205 kg)  04/25/15 114 lb (51.71 kg)     Lab Results  Component Value Date   WBC 5.5 04/25/2015   HGB 14.9 04/25/2015   HCT 45.3 04/25/2015   PLT 208.0 04/25/2015   GLUCOSE 92 04/25/2015   CHOL 175 04/25/2015   TRIG 89.0 04/25/2015   HDL 90.50 04/25/2015   LDLCALC 67 04/25/2015   ALT 19 04/25/2015   AST 19 04/25/2015   NA 143 04/25/2015   K 4.4 04/25/2015   CL 104 04/25/2015   CREATININE 0.79 04/25/2015   BUN 20 04/25/2015   CO2 31 04/25/2015   TSH 1.74 04/25/2015    Lab Results  Component Value Date   TSH 1.74 04/25/2015   Lab Results  Component Value Date   WBC 5.5 04/25/2015   HGB 14.9 04/25/2015   HCT 45.3 04/25/2015   MCV 88.5 04/25/2015   PLT 208.0 04/25/2015   Lab Results  Component Value Date   NA 143 04/25/2015   K 4.4 04/25/2015   CO2 31 04/25/2015   GLUCOSE 92 04/25/2015   BUN 20 04/25/2015   CREATININE 0.79 04/25/2015   BILITOT 1.0 04/25/2015   ALKPHOS 56 04/25/2015   AST 19 04/25/2015   ALT 19 04/25/2015   PROT 7.1 04/25/2015   ALBUMIN 4.2 04/25/2015   CALCIUM 9.4 04/25/2015   GFR 74.10 04/25/2015   Lab Results  Component Value Date   CHOL 175 04/25/2015   Lab Results  Component Value Date   HDL 90.50 04/25/2015   Lab Results  Component Value Date   LDLCALC 67 04/25/2015   Lab Results  Component Value Date   TRIG 89.0 04/25/2015   Lab Results  Component Value Date   CHOLHDL 2 04/25/2015   No results found for: HGBA1C     Assessment & Plan:   Problem  List Items Addressed This Visit    None      I have discontinued Jennifer Wilkins's cholecalciferol, zoster vaccine live (PF), and baclofen. I am also having her maintain her Vitamin B-12 CR, aspirin, Misc Natural Products (OSTEO BI-FLEX ADV JOINT SHIELD PO), Calcium Carbonate-Vitamin D, carvedilol, hydrochlorothiazide, and simvastatin.  No orders of the defined types were placed in this encounter.     Jan Fireman, [place]

## 2015-11-21 NOTE — Progress Notes (Signed)
Pre visit review using our clinic review tool, if applicable. No additional management support is needed unless otherwise documented below in the visit note. 

## 2015-11-22 LAB — COMPREHENSIVE METABOLIC PANEL
ALBUMIN: 4 g/dL (ref 3.5–5.2)
ALK PHOS: 56 U/L (ref 39–117)
ALT: 17 U/L (ref 0–35)
AST: 19 U/L (ref 0–37)
BUN: 18 mg/dL (ref 6–23)
CALCIUM: 9.4 mg/dL (ref 8.4–10.5)
CHLORIDE: 100 meq/L (ref 96–112)
CO2: 30 mEq/L (ref 19–32)
CREATININE: 0.94 mg/dL (ref 0.40–1.20)
GFR: 60.55 mL/min (ref >60.00)
GLUCOSE: 79 mg/dL (ref 70–99)
Potassium: 3.6 mEq/L (ref 3.5–5.1)
Sodium: 139 mEq/L (ref 135–145)
Total Bilirubin: 0.5 mg/dL (ref 0.2–1.2)
Total Protein: 6.7 g/dL (ref 6.0–8.3)

## 2015-11-22 LAB — LIPID PANEL
CHOLESTEROL: 163 mg/dL (ref 0–200)
HDL: 86.3 mg/dL (ref >39.00)
LDL CALC: 62 mg/dL (ref 0–99)
NonHDL: 77.11
TRIGLYCERIDES: 75 mg/dL (ref 0.0–149.0)
Total CHOL/HDL Ratio: 2
VLDL: 15 mg/dL (ref 0.0–40.0)

## 2015-11-22 LAB — VITAMIN D 25 HYDROXY (VIT D DEFICIENCY, FRACTURES): VITD: 92.72 ng/mL (ref 30.00–100.00)

## 2015-11-23 NOTE — Telephone Encounter (Signed)
Patient scheduled for 05/28/2016 at 9:30am.

## 2015-11-23 NOTE — Telephone Encounter (Signed)
Rose, Good morning, This patient has her last prolia on 07/17/2015.  I spoke to her this morning 11/23/2015.  She just wants to go ahead and start getting the process completed for her next Prolia which will be due in June. Thanks,

## 2015-11-24 DIAGNOSIS — M5442 Lumbago with sciatica, left side: Secondary | ICD-10-CM | POA: Diagnosis not present

## 2015-11-24 DIAGNOSIS — R262 Difficulty in walking, not elsewhere classified: Secondary | ICD-10-CM | POA: Diagnosis not present

## 2015-11-24 DIAGNOSIS — M6281 Muscle weakness (generalized): Secondary | ICD-10-CM | POA: Diagnosis not present

## 2015-11-24 DIAGNOSIS — M545 Low back pain: Secondary | ICD-10-CM | POA: Diagnosis not present

## 2015-11-24 DIAGNOSIS — M5441 Lumbago with sciatica, right side: Secondary | ICD-10-CM | POA: Diagnosis not present

## 2015-11-27 DIAGNOSIS — M5442 Lumbago with sciatica, left side: Secondary | ICD-10-CM | POA: Diagnosis not present

## 2015-11-27 DIAGNOSIS — R262 Difficulty in walking, not elsewhere classified: Secondary | ICD-10-CM | POA: Diagnosis not present

## 2015-11-27 DIAGNOSIS — M545 Low back pain: Secondary | ICD-10-CM | POA: Diagnosis not present

## 2015-11-27 DIAGNOSIS — M6281 Muscle weakness (generalized): Secondary | ICD-10-CM | POA: Diagnosis not present

## 2015-11-27 DIAGNOSIS — M5441 Lumbago with sciatica, right side: Secondary | ICD-10-CM | POA: Diagnosis not present

## 2015-12-01 ENCOUNTER — Telehealth: Payer: Self-pay | Admitting: Family Medicine

## 2015-12-01 DIAGNOSIS — M6281 Muscle weakness (generalized): Secondary | ICD-10-CM | POA: Diagnosis not present

## 2015-12-01 DIAGNOSIS — M5431 Sciatica, right side: Secondary | ICD-10-CM

## 2015-12-01 DIAGNOSIS — M545 Low back pain: Secondary | ICD-10-CM | POA: Diagnosis not present

## 2015-12-01 DIAGNOSIS — R262 Difficulty in walking, not elsewhere classified: Secondary | ICD-10-CM | POA: Diagnosis not present

## 2015-12-01 DIAGNOSIS — M5442 Lumbago with sciatica, left side: Secondary | ICD-10-CM | POA: Diagnosis not present

## 2015-12-01 DIAGNOSIS — M5441 Lumbago with sciatica, right side: Secondary | ICD-10-CM | POA: Diagnosis not present

## 2015-12-01 DIAGNOSIS — M5432 Sciatica, left side: Principal | ICD-10-CM

## 2015-12-01 NOTE — Telephone Encounter (Signed)
Jennifer Wilkins This patient would like to get her next Prolia as is now due.  Please go ahead and do the authorization and let know the results.

## 2015-12-01 NOTE — Telephone Encounter (Signed)
She needs to get set up for next Prolia and she needs a referral to orthopaedics for persistent sciatica.

## 2015-12-01 NOTE — Telephone Encounter (Signed)
Relation to [id number] Call back [phone]   Reason for call:  Patient states her 9 week  physical therapy for her sicatic nerve is done and would like to know an alternative because she's still in pain.  Requesting orders for injection for osteoporosis. Please advise

## 2015-12-04 ENCOUNTER — Telehealth: Payer: Self-pay | Admitting: *Deleted

## 2015-12-04 NOTE — Assessment & Plan Note (Signed)
In agreement on Prolia, will give every 6 months.

## 2015-12-04 NOTE — Telephone Encounter (Signed)
Referral done and sent prolia request to Sue Lush, Please begin the authorization for this patients prolia.

## 2015-12-04 NOTE — Telephone Encounter (Addendum)
I have electronically submitted pt's info for [place] verification and will notify you once I have a response.  Please keep in mind that for insurance purposes patient is not actually due her injection until 01/16/2016. Thank you.

## 2015-12-04 NOTE — Assessment & Plan Note (Signed)
Encouraged supplements today

## 2015-12-04 NOTE — Telephone Encounter (Signed)
Forwarded to Dr. Blyth. JG//CMA  

## 2015-12-04 NOTE — Assessment & Plan Note (Signed)
Tolerating statin, encouraged heart healthy diet, avoid trans fats, minimize simple carbs and saturated fats. Increase exercise as tolerated 

## 2015-12-05 NOTE — Telephone Encounter (Signed)
I took care of this yesterday and it is noted in phone note dated 06/30/2015.  "I have electronically submitted pt's info for [place] verification and will notify you once I have a response. Please keep in mind that for insurance purposes patient is not actually due her injection until 01/16/2016. Thank you."

## 2015-12-07 NOTE — Telephone Encounter (Signed)
Signed forms faxed to [facility] successfully at [phone]. Sent for scanning. JG//CMA

## 2015-12-12 DIAGNOSIS — G8929 Other chronic pain: Secondary | ICD-10-CM | POA: Diagnosis not present

## 2015-12-12 DIAGNOSIS — M5136 Other intervertebral disc degeneration, lumbar region: Secondary | ICD-10-CM | POA: Diagnosis not present

## 2015-12-12 DIAGNOSIS — M5441 Lumbago with sciatica, right side: Secondary | ICD-10-CM | POA: Diagnosis not present

## 2015-12-13 NOTE — Telephone Encounter (Signed)
I have rec'd Jennifer Wilkins's insurance verification for Prolia and she has and estimated responsibility of $0.  Please make pt aware this is an estimate and we will not know an exact amt until insurance(s) has/have paid.  I have sent a copy of the summary of benefits to be scanned into pt's chart.    Once pt recs injection, please let me know actual injection date so I can update the Prolia portal.  If you have any questions, please let me know.  Thank you!

## 2015-12-14 NOTE — Telephone Encounter (Signed)
Patient has been informed of her cost of Prolia. She is going to organize her transportation and call back to schedule nurse visit appt. For Prolia. Will go ahead and let Gilmore Laroche order her a shot so is here when she comes for her shot.

## 2015-12-14 NOTE — Telephone Encounter (Signed)
Called the patient left message to call back 

## 2015-12-27 DIAGNOSIS — M5136 Other intervertebral disc degeneration, lumbar region: Secondary | ICD-10-CM | POA: Diagnosis not present

## 2016-01-02 DIAGNOSIS — M5441 Lumbago with sciatica, right side: Secondary | ICD-10-CM | POA: Diagnosis not present

## 2016-01-02 DIAGNOSIS — G8929 Other chronic pain: Secondary | ICD-10-CM | POA: Diagnosis not present

## 2016-01-02 DIAGNOSIS — M5416 Radiculopathy, lumbar region: Secondary | ICD-10-CM | POA: Diagnosis not present

## 2016-01-02 DIAGNOSIS — M5136 Other intervertebral disc degeneration, lumbar region: Secondary | ICD-10-CM | POA: Diagnosis not present

## 2016-01-03 NOTE — Telephone Encounter (Signed)
Pt called in to check the status of the message below. Informed pt of the message. She expressed understanding. Pt would like a follow up call back to be updated with current status.     CB: [phone]

## 2016-01-04 NOTE — Telephone Encounter (Signed)
Read telephone note dated 06/30/2015.  This patient has been already informed of cost and was to figure out transportation to get her Prolia.  Called her back to inform, no answer/no voice mail.  Will call back.

## 2016-01-04 NOTE — Telephone Encounter (Signed)
Pt has been scheduled.  °

## 2016-01-09 ENCOUNTER — Telehealth: Payer: Self-pay | Admitting: Family Medicine

## 2016-01-09 ENCOUNTER — Ambulatory Visit (INDEPENDENT_AMBULATORY_CARE_PROVIDER_SITE_OTHER): Payer: Medicare Other | Admitting: *Deleted

## 2016-01-09 DIAGNOSIS — M810 Age-related osteoporosis without current pathological fracture: Secondary | ICD-10-CM

## 2016-01-09 MED ORDER — DENOSUMAB 60 MG/ML ~~LOC~~ SOLN
60.0000 mg | Freq: Once | SUBCUTANEOUS | Status: AC
  Administered 2016-01-09: 60 mg via SUBCUTANEOUS

## 2016-01-09 NOTE — Telephone Encounter (Signed)
Thank you Robin!

## 2016-01-09 NOTE — Progress Notes (Signed)
Pre visit review using our clinic review tool, if applicable. No additional management support is needed unless otherwise documented below in the visit note.  Pt tolerated injection well.   Rasheema Truluck J Stepahnie Campo, RN  

## 2016-01-09 NOTE — Telephone Encounter (Signed)
Rose, Just wanted to inform you that this patient Jennifer Wilkins had her Prolia this morning.  I know you requested to be notified once administered. Thanks, Shirlean Mylar

## 2016-01-30 ENCOUNTER — Other Ambulatory Visit: Payer: Self-pay | Admitting: Family Medicine

## 2016-03-19 DIAGNOSIS — D485 Neoplasm of uncertain behavior of skin: Secondary | ICD-10-CM | POA: Diagnosis not present

## 2016-03-20 DIAGNOSIS — B079 Viral wart, unspecified: Secondary | ICD-10-CM | POA: Diagnosis not present

## 2016-04-23 DIAGNOSIS — Z23 Encounter for immunization: Secondary | ICD-10-CM | POA: Diagnosis not present

## 2016-04-30 ENCOUNTER — Other Ambulatory Visit: Payer: Self-pay | Admitting: Family Medicine

## 2016-05-14 DIAGNOSIS — Z961 Presence of intraocular lens: Secondary | ICD-10-CM | POA: Diagnosis not present

## 2016-05-14 DIAGNOSIS — H5213 Myopia, bilateral: Secondary | ICD-10-CM | POA: Diagnosis not present

## 2016-05-14 DIAGNOSIS — H52223 Regular astigmatism, bilateral: Secondary | ICD-10-CM | POA: Diagnosis not present

## 2016-05-28 ENCOUNTER — Ambulatory Visit (INDEPENDENT_AMBULATORY_CARE_PROVIDER_SITE_OTHER): Payer: Medicare Other | Admitting: Family Medicine

## 2016-05-28 ENCOUNTER — Encounter: Payer: Self-pay | Admitting: Family Medicine

## 2016-05-28 VITALS — BP 152/92 | HR 62 | Temp 97.6°F | Wt 108.2 lb

## 2016-05-28 DIAGNOSIS — I10 Essential (primary) hypertension: Secondary | ICD-10-CM | POA: Diagnosis not present

## 2016-05-28 DIAGNOSIS — M48061 Spinal stenosis, lumbar region without neurogenic claudication: Secondary | ICD-10-CM | POA: Diagnosis not present

## 2016-05-28 DIAGNOSIS — G8929 Other chronic pain: Secondary | ICD-10-CM

## 2016-05-28 DIAGNOSIS — M79604 Pain in right leg: Secondary | ICD-10-CM

## 2016-05-28 DIAGNOSIS — E559 Vitamin D deficiency, unspecified: Secondary | ICD-10-CM | POA: Diagnosis not present

## 2016-05-28 DIAGNOSIS — M810 Age-related osteoporosis without current pathological fracture: Secondary | ICD-10-CM

## 2016-05-28 DIAGNOSIS — M545 Low back pain, unspecified: Secondary | ICD-10-CM

## 2016-05-28 DIAGNOSIS — F430 Acute stress reaction: Secondary | ICD-10-CM

## 2016-05-28 DIAGNOSIS — E782 Mixed hyperlipidemia: Secondary | ICD-10-CM

## 2016-05-28 DIAGNOSIS — Z0000 Encounter for general adult medical examination without abnormal findings: Secondary | ICD-10-CM

## 2016-05-28 LAB — VITAMIN D 25 HYDROXY (VIT D DEFICIENCY, FRACTURES): VITD: 94.55 ng/mL (ref 30.00–100.00)

## 2016-05-28 LAB — COMPREHENSIVE METABOLIC PANEL
ALT: 17 U/L (ref 0–35)
AST: 19 U/L (ref 0–37)
Albumin: 4.1 g/dL (ref 3.5–5.2)
Alkaline Phosphatase: 54 U/L (ref 39–117)
BUN: 23 mg/dL (ref 6–23)
CHLORIDE: 104 meq/L (ref 96–112)
CO2: 29 meq/L (ref 19–32)
CREATININE: 0.76 mg/dL (ref 0.40–1.20)
Calcium: 9.4 mg/dL (ref 8.4–10.5)
GFR: 77.28 mL/min (ref >60.00)
GLUCOSE: 95 mg/dL (ref 70–99)
POTASSIUM: 3.9 meq/L (ref 3.5–5.1)
SODIUM: 142 meq/L (ref 135–145)
Total Bilirubin: 0.7 mg/dL (ref 0.2–1.2)
Total Protein: 7 g/dL (ref 6.0–8.3)

## 2016-05-28 LAB — LIPID PANEL
CHOL/HDL RATIO: 2
Cholesterol: 189 mg/dL (ref 0–200)
HDL: 94.1 mg/dL (ref >39.00)
LDL CALC: 78 mg/dL (ref 0–99)
NONHDL: 95.07
Triglycerides: 83 mg/dL (ref 0.0–149.0)
VLDL: 16.6 mg/dL (ref 0.0–40.0)

## 2016-05-28 LAB — TSH: TSH: 1.81 u[IU]/mL (ref 0.35–4.50)

## 2016-05-28 LAB — CBC
HCT: 41.7 % (ref 36.0–46.0)
HEMOGLOBIN: 14 g/dL (ref 12.0–15.0)
MCHC: 33.6 g/dL (ref 30.0–36.0)
MCV: 87.5 fl (ref 78.0–100.0)
PLATELETS: 210 10*3/uL (ref 150.0–400.0)
RBC: 4.77 Mil/uL (ref 3.87–5.11)
RDW: 14.2 % (ref 11.5–15.5)
WBC: 5.2 10*3/uL (ref 4.0–10.5)

## 2016-05-28 NOTE — Assessment & Plan Note (Signed)
Check level and take daily supplements 

## 2016-05-28 NOTE — Assessment & Plan Note (Signed)
Encouraged heart healthy diet, increase exercise, avoid trans fats, consider a krill oil cap daily 

## 2016-05-28 NOTE — Assessment & Plan Note (Signed)
Struggling with pain x 10 month. Has done PT and is now doing. Review of [place] notes reveal severe spinal stenosis at L4-5 and surgery has beenn recommended by Dr Nelva Bush. Will make new referral to Dr Tonita Cong whom Dr Nelva Bush recommended.

## 2016-05-28 NOTE — Patient Instructions (Addendum)
Due for Pneumovax immunization  Preventive Care for Adults, Female A healthy lifestyle and preventive care can promote health and wellness. Preventive health guidelines for women include the following key practices.  A routine yearly physical is a good way to check with your health care provider about your health and preventive screening. It is a chance to share any concerns and updates on your health and to receive a thorough exam.  Visit your dentist for a routine exam and preventive care every 6 months. Brush your teeth twice a day and floss once a day. Good oral hygiene prevents tooth decay and gum disease.  The frequency of eye exams is based on your age, health, family medical history, use of contact lenses, and other factors. Follow your health care provider's recommendations for frequency of eye exams.  Eat a healthy diet. Foods like vegetables, fruits, whole grains, low-fat dairy products, and lean protein foods contain the nutrients you need without too many calories. Decrease your intake of foods high in solid fats, added sugars, and salt. Eat the right amount of calories for you.Get information about a proper diet from your health care provider, if necessary.  Regular physical exercise is one of the most important things you can do for your health. Most adults should get at least 150 minutes of moderate-intensity exercise (any activity that increases your heart rate and causes you to sweat) each week. In addition, most adults need muscle-strengthening exercises on 2 or more days a week.  Maintain a healthy weight. The body mass index (BMI) is a screening tool to identify possible weight problems. It provides an estimate of body fat based on height and weight. Your health care provider can find your BMI and can help you achieve or maintain a healthy weight.For adults 20 years and older:  A BMI below 18.5 is considered underweight.  A BMI of 18.5 to 24.9 is normal.  A BMI of 25 to  29.9 is considered overweight.  A BMI of 30 and above is considered obese.  Maintain normal blood lipids and cholesterol levels by exercising and minimizing your intake of saturated fat. Eat a balanced diet with plenty of fruit and vegetables. Blood tests for lipids and cholesterol should begin at age 36 and be repeated every 5 years. If your lipid or cholesterol levels are high, you are over 50, or you are at high risk for heart disease, you may need your cholesterol levels checked more frequently.Ongoing high lipid and cholesterol levels should be treated with medicines if diet and exercise are not working.  If you smoke, find out from your health care provider how to quit. If you do not use tobacco, do not start.  Lung cancer screening is recommended for adults aged 59-80 years who are at high risk for developing lung cancer because of a history of smoking. A yearly low-dose CT scan of the lungs is recommended for people who have at least a 30-pack-year history of smoking and are a current smoker or have quit within the past 15 years. A pack year of smoking is smoking an average of 1 pack of cigarettes a day for 1 year (for example: 1 pack a day for 30 years or 2 packs a day for 15 years). Yearly screening should continue until the smoker has stopped smoking for at least 15 years. Yearly screening should be stopped for people who develop a health problem that would prevent them from having lung cancer treatment.  If you are pregnant, do not  drink alcohol. If you are breastfeeding, be very cautious about drinking alcohol. If you are not pregnant and choose to drink alcohol, do not have more than 1 drink per day. One drink is considered to be 12 ounces (355 mL) of beer, 5 ounces (148 mL) of wine, or 1.5 ounces (44 mL) of liquor.  Avoid use of street drugs. Do not share needles with anyone. Ask for help if you need support or instructions about stopping the use of drugs.  High blood pressure causes  heart disease and increases the risk of stroke. Your blood pressure should be checked at least every 1 to 2 years. Ongoing high blood pressure should be treated with medicines if weight loss and exercise do not work.  If you are 25-27 years old, ask your health care provider if you should take aspirin to prevent strokes.  Diabetes screening is done by taking a blood sample to check your blood glucose level after you have not eaten for a certain period of time (fasting). If you are not overweight and you do not have risk factors for diabetes, you should be screened once every 3 years starting at age 35. If you are overweight or obese and you are 69-2 years of age, you should be screened for diabetes every year as part of your cardiovascular risk assessment.  Breast cancer screening is essential preventive care for women. You should practice "breast self-awareness." This means understanding the normal appearance and feel of your breasts and may include breast self-examination. Any changes detected, no matter how small, should be reported to a health care provider. Women in their 71s and 30s should have a clinical breast exam (CBE) by a health care provider as part of a regular health exam every 1 to 3 years. After age 73, women should have a CBE every year. Starting at age 46, women should consider having a mammogram (breast X-ray test) every year. Women who have a family history of breast cancer should talk to their health care provider about genetic screening. Women at a high risk of breast cancer should talk to their health care providers about having an MRI and a mammogram every year.  Breast cancer gene (BRCA)-related cancer risk assessment is recommended for women who have family members with BRCA-related cancers. BRCA-related cancers include breast, ovarian, tubal, and peritoneal cancers. Having family members with these cancers may be associated with an increased risk for harmful changes (mutations)  in the breast cancer genes BRCA1 and BRCA2. Results of the assessment will determine the need for genetic counseling and BRCA1 and BRCA2 testing.  Your health care provider may recommend that you be screened regularly for cancer of the pelvic organs (ovaries, uterus, and vagina). This screening involves a pelvic examination, including checking for microscopic changes to the surface of your cervix (Pap test). You may be encouraged to have this screening done every 3 years, beginning at age 42.  For women ages 64-65, health care providers may recommend pelvic exams and Pap testing every 3 years, or they may recommend the Pap and pelvic exam, combined with testing for human papilloma virus (HPV), every 5 years. Some types of HPV increase your risk of cervical cancer. Testing for HPV may also be done on women of any age with unclear Pap test results.  Other health care providers may not recommend any screening for nonpregnant women who are considered low risk for pelvic cancer and who do not have symptoms. Ask your health care provider if a screening  pelvic exam is right for you.  If you have had past treatment for cervical cancer or a condition that could lead to cancer, you need Pap tests and screening for cancer for at least 20 years after your treatment. If Pap tests have been discontinued, your risk factors (such as having a new sexual partner) need to be reassessed to determine if screening should resume. Some women have medical problems that increase the chance of getting cervical cancer. In these cases, your health care provider may recommend more frequent screening and Pap tests.  Colorectal cancer can be detected and often prevented. Most routine colorectal cancer screening begins at the age of 42 years and continues through age 66 years. However, your health care provider may recommend screening at an earlier age if you have risk factors for colon cancer. On a yearly basis, your health care provider  may provide home test kits to check for hidden blood in the stool. Use of a small camera at the end of a tube, to directly examine the colon (sigmoidoscopy or colonoscopy), can detect the earliest forms of colorectal cancer. Talk to your health care provider about this at age 64, when routine screening begins. Direct exam of the colon should be repeated every 5-10 years through age 36 years, unless early forms of precancerous polyps or small growths are found.  People who are at an increased risk for hepatitis B should be screened for this virus. You are considered at high risk for hepatitis B if:  You were born in a country where hepatitis B occurs often. Talk with your health care provider about which countries are considered high risk.  Your parents were born in a high-risk country and you have not received a shot to protect against hepatitis B (hepatitis B vaccine).  You have HIV or AIDS.  You use needles to inject street drugs.  You live with, or have sex with, someone who has hepatitis B.  You get hemodialysis treatment.  You take certain medicines for conditions like cancer, organ transplantation, and autoimmune conditions.  Hepatitis C blood testing is recommended for all people born from [birth year] through 1965 and any individual with known risks for hepatitis C.  Practice safe sex. Use condoms and avoid high-risk sexual practices to reduce the spread of sexually transmitted infections (STIs). STIs include gonorrhea, chlamydia, syphilis, trichomonas, herpes, HPV, and human immunodeficiency virus (HIV). Herpes, HIV, and HPV are viral illnesses that have no cure. They can result in disability, cancer, and death.  You should be screened for sexually transmitted illnesses (STIs) including gonorrhea and chlamydia if:  You are sexually active and are younger than 24 years.  You are older than 24 years and your health care provider tells you that you are at risk for this type of  infection.  Your sexual activity has changed since you were last screened and you are at an increased risk for chlamydia or gonorrhea. Ask your health care provider if you are at risk.  If you are at risk of being infected with HIV, it is recommended that you take a prescription medicine daily to prevent HIV infection. This is called preexposure prophylaxis (PrEP). You are considered at risk if:  You are sexually active and do not regularly use condoms or know the HIV status of your partner(s).  You take drugs by injection.  You are sexually active with a partner who has HIV.  Talk with your health care provider about whether you are at high risk of  being infected with HIV. If you choose to begin PrEP, you should first be tested for HIV. You should then be tested every 3 months for as long as you are taking PrEP.  Osteoporosis is a disease in which the bones lose minerals and strength with aging. This can result in serious bone fractures or breaks. The risk of osteoporosis can be identified using a bone density scan. Women ages 53 years and over and women at risk for fractures or osteoporosis should discuss screening with their health care providers. Ask your health care provider whether you should take a calcium supplement or vitamin D to reduce the rate of osteoporosis.  Menopause can be associated with physical symptoms and risks. Hormone replacement therapy is available to decrease symptoms and risks. You should talk to your health care provider about whether hormone replacement therapy is right for you.  Use sunscreen. Apply sunscreen liberally and repeatedly throughout the day. You should seek shade when your shadow is shorter than you. Protect yourself by wearing long sleeves, pants, a wide-brimmed hat, and sunglasses year round, whenever you are outdoors.  Once a month, do a whole body skin exam, using a mirror to look at the skin on your back. Tell your health care provider of new moles,  moles that have irregular borders, moles that are larger than a pencil eraser, or moles that have changed in shape or color.  Stay current with required vaccines (immunizations).  Influenza vaccine. All adults should be immunized every year.  Tetanus, diphtheria, and acellular pertussis (Td, Tdap) vaccine. Pregnant women should receive 1 dose of Tdap vaccine during each pregnancy. The dose should be obtained regardless of the length of time since the last dose. Immunization is preferred during the 27th-36th week of gestation. An adult who has not previously received Tdap or who does not know her vaccine status should receive 1 dose of Tdap. This initial dose should be followed by tetanus and diphtheria toxoids (Td) booster doses every 10 years. Adults with an unknown or incomplete history of completing a 3-dose immunization series with Td-containing vaccines should begin or complete a primary immunization series including a Tdap dose. Adults should receive a Td booster every 10 years.  Varicella vaccine. An adult without evidence of immunity to varicella should receive 2 doses or a second dose if she has previously received 1 dose. Pregnant females who do not have evidence of immunity should receive the first dose after pregnancy. This first dose should be obtained before leaving the health care facility. The second dose should be obtained 4-8 weeks after the first dose.  Human papillomavirus (HPV) vaccine. Females aged 13-26 years who have not received the vaccine previously should obtain the 3-dose series. The vaccine is not recommended for use in pregnant females. However, pregnancy testing is not needed before receiving a dose. If a female is found to be pregnant after receiving a dose, no treatment is needed. In that case, the remaining doses should be delayed until after the pregnancy. Immunization is recommended for any person with an immunocompromised condition through the age of 43 years if she  did not get any or all doses earlier. During the 3-dose series, the second dose should be obtained 4-8 weeks after the first dose. The third dose should be obtained 24 weeks after the first dose and 16 weeks after the second dose.  Zoster vaccine. One dose is recommended for adults aged 51 years or older unless certain conditions are present.  Measles, mumps, and  rubella (MMR) vaccine. Adults born before [birth year] generally are considered immune to measles and mumps. Adults born in [birth year] or later should have 1 or more doses of MMR vaccine unless there is a contraindication to the vaccine or there is laboratory evidence of immunity to each of the three diseases. A routine second dose of MMR vaccine should be obtained at least 28 days after the first dose for students attending postsecondary schools, health care workers, or international travelers. People who received inactivated measles vaccine or an unknown type of measles vaccine during 1963-1967 should receive 2 doses of MMR vaccine. People who received inactivated mumps vaccine or an unknown type of mumps vaccine before 1979 and are at high risk for mumps infection should consider immunization with 2 doses of MMR vaccine. For females of childbearing age, rubella immunity should be determined. If there is no evidence of immunity, females who are not pregnant should be vaccinated. If there is no evidence of immunity, females who are pregnant should delay immunization until after pregnancy. Unvaccinated health care workers born before [birth year] who lack laboratory evidence of measles, mumps, or rubella immunity or laboratory confirmation of disease should consider measles and mumps immunization with 2 doses of MMR vaccine or rubella immunization with 1 dose of MMR vaccine.  Pneumococcal 13-valent conjugate (PCV13) vaccine. When indicated, a person who is uncertain of his immunization history and has no record of immunization should receive the PCV13 vaccine. All adults  7 years of age and older should receive this vaccine. An adult aged 74 years or older who has certain medical conditions and has not been previously immunized should receive 1 dose of PCV13 vaccine. This PCV13 should be followed with a dose of pneumococcal polysaccharide (PPSV23) vaccine. Adults who are at high risk for pneumococcal disease should obtain the PPSV23 vaccine at least 8 weeks after the dose of PCV13 vaccine. Adults older than 80 years of age who have normal immune system function should obtain the PPSV23 vaccine dose at least 1 year after the dose of PCV13 vaccine.  Pneumococcal polysaccharide (PPSV23) vaccine. When PCV13 is also indicated, PCV13 should be obtained first. All adults aged 96 years and older should be immunized. An adult younger than age 72 years who has certain medical conditions should be immunized. Any person who resides in a nursing home or long-term care facility should be immunized. An adult smoker should be immunized. People with an immunocompromised condition and certain other conditions should receive both PCV13 and PPSV23 vaccines. People with human immunodeficiency virus (HIV) infection should be immunized as soon as possible after diagnosis. Immunization during chemotherapy or radiation therapy should be avoided. Routine use of PPSV23 vaccine is not recommended for American Indians, [place] Natives, or people younger than 65 years unless there are medical conditions that require PPSV23 vaccine. When indicated, people who have unknown immunization and have no record of immunization should receive PPSV23 vaccine. One-time revaccination 5 years after the first dose of PPSV23 is recommended for people aged 19-64 years who have chronic kidney failure, nephrotic syndrome, asplenia, or immunocompromised conditions. People who received 1-2 doses of PPSV23 before age 33 years should receive another dose of PPSV23 vaccine at age 61 years or later if at least 5 years have passed since  the previous dose. Doses of PPSV23 are not needed for people immunized with PPSV23 at or after age 19 years.  Meningococcal vaccine. Adults with asplenia or persistent complement component deficiencies should receive 2 doses of quadrivalent meningococcal conjugate (MenACWY-D) vaccine.  The doses should be obtained at least 2 months apart. Microbiologists working with certain meningococcal bacteria, [place] recruits, people at risk during an outbreak, and people who travel to or live in countries with a high rate of meningitis should be immunized. A first-year college student up through age 74 years who is living in a residence hall should receive a dose if she did not receive a dose on or after her 16th birthday. Adults who have certain high-risk conditions should receive one or more doses of vaccine.  Hepatitis A vaccine. Adults who wish to be protected from this disease, have certain high-risk conditions, work with hepatitis A-infected animals, work in hepatitis A research labs, or travel to or work in countries with a high rate of hepatitis A should be immunized. Adults who were previously unvaccinated and who anticipate close contact with an international adoptee during the first 60 days after arrival in the [place] from a country with a high rate of hepatitis A should be immunized.  Hepatitis B vaccine. Adults who wish to be protected from this disease, have certain high-risk conditions, may be exposed to blood or other infectious body fluids, are household contacts or sex partners of hepatitis B positive people, are clients or workers in certain care facilities, or travel to or work in countries with a high rate of hepatitis B should be immunized.  Haemophilus influenzae type b (Hib) vaccine. A previously unvaccinated person with asplenia or sickle cell disease or having a scheduled splenectomy should receive 1 dose of Hib vaccine. Regardless of previous immunization, a recipient of a  hematopoietic stem cell transplant should receive a 3-dose series 6-12 months after her successful transplant. Hib vaccine is not recommended for adults with HIV infection. Preventive Services / Frequency Ages 63 to 50 years  Blood pressure check.** / Every 3-5 years.  Lipid and cholesterol check.** / Every 5 years beginning at age 73.  Clinical breast exam.** / Every 3 years for women in their 26s and 16s.  BRCA-related cancer risk assessment.** / For women who have family members with a BRCA-related cancer (breast, ovarian, tubal, or peritoneal cancers).  Pap test.** / Every 2 years from ages 3 through 39. Every 3 years starting at age 59 through age 52 or 65 with a history of 3 consecutive normal Pap tests.  HPV screening.** / Every 3 years from ages 38 through ages 61 to 4 with a history of 3 consecutive normal Pap tests.  Hepatitis C blood test.** / For any individual with known risks for hepatitis C.  Skin self-exam. / Monthly.  Influenza vaccine. / Every year.  Tetanus, diphtheria, and acellular pertussis (Tdap, Td) vaccine.** / Consult your health care provider. Pregnant women should receive 1 dose of Tdap vaccine during each pregnancy. 1 dose of Td every 10 years.  Varicella vaccine.** / Consult your health care provider. Pregnant females who do not have evidence of immunity should receive the first dose after pregnancy.  HPV vaccine. / 3 doses over 6 months, if 25 and younger. The vaccine is not recommended for use in pregnant females. However, pregnancy testing is not needed before receiving a dose.  Measles, mumps, rubella (MMR) vaccine.** / You need at least 1 dose of MMR if you were born in 1957 or later. You may also need a 2nd dose. For females of childbearing age, rubella immunity should be determined. If there is no evidence of immunity, females who are not pregnant should be vaccinated. If there is no evidence  of immunity, females who are pregnant should delay  immunization until after pregnancy.  Pneumococcal 13-valent conjugate (PCV13) vaccine.** / Consult your health care provider.  Pneumococcal polysaccharide (PPSV23) vaccine.** / 1 to 2 doses if you smoke cigarettes or if you have certain conditions.  Meningococcal vaccine.** / 1 dose if you are age 61 to 27 years and a Market researcher living in a residence hall, or have one of several medical conditions, you need to get vaccinated against meningococcal disease. You may also need additional booster doses.  Hepatitis A vaccine.** / Consult your health care provider.  Hepatitis B vaccine.** / Consult your health care provider.  Haemophilus influenzae type b (Hib) vaccine.** / Consult your health care provider. Ages 72 to 29 years  Blood pressure check.** / Every year.  Lipid and cholesterol check.** / Every 5 years beginning at age 69 years.  Lung cancer screening. / Every year if you are aged 41-80 years and have a 30-pack-year history of smoking and currently smoke or have quit within the past 15 years. Yearly screening is stopped once you have quit smoking for at least 15 years or develop a health problem that would prevent you from having lung cancer treatment.  Clinical breast exam.** / Every year after age 86 years.  BRCA-related cancer risk assessment.** / For women who have family members with a BRCA-related cancer (breast, ovarian, tubal, or peritoneal cancers).  Mammogram.** / Every year beginning at age 59 years and continuing for as long as you are in good health. Consult with your health care provider.  Pap test.** / Every 3 years starting at age 14 years through age 59 or 44 years with a history of 3 consecutive normal Pap tests.  HPV screening.** / Every 3 years from ages 67 years through ages 34 to 39 years with a history of 3 consecutive normal Pap tests.  Fecal occult blood test (FOBT) of stool. / Every year beginning at age 66 years and continuing until age  44 years. You may not need to do this test if you get a colonoscopy every 10 years.  Flexible sigmoidoscopy or colonoscopy.** / Every 5 years for a flexible sigmoidoscopy or every 10 years for a colonoscopy beginning at age 57 years and continuing until age 40 years.  Hepatitis C blood test.** / For all people born from [birth year] through 1963 and any individual with known risks for hepatitis C.  Skin self-exam. / Monthly.  Influenza vaccine. / Every year.  Tetanus, diphtheria, and acellular pertussis (Tdap/Td) vaccine.** / Consult your health care provider. Pregnant women should receive 1 dose of Tdap vaccine during each pregnancy. 1 dose of Td every 10 years.  Varicella vaccine.** / Consult your health care provider. Pregnant females who do not have evidence of immunity should receive the first dose after pregnancy.  Zoster vaccine.** / 1 dose for adults aged 53 years or older.  Measles, mumps, rubella (MMR) vaccine.** / You need at least 1 dose of MMR if you were born in 1957 or later. You may also need a second dose. For females of childbearing age, rubella immunity should be determined. If there is no evidence of immunity, females who are not pregnant should be vaccinated. If there is no evidence of immunity, females who are pregnant should delay immunization until after pregnancy.  Pneumococcal 13-valent conjugate (PCV13) vaccine.** / Consult your health care provider.  Pneumococcal polysaccharide (PPSV23) vaccine.** / 1 to 2 doses if you smoke cigarettes or if you have certain  conditions.  Meningococcal vaccine.** / Consult your health care provider.  Hepatitis A vaccine.** / Consult your health care provider.  Hepatitis B vaccine.** / Consult your health care provider.  Haemophilus influenzae type b (Hib) vaccine.** / Consult your health care provider. Ages 3 years and over  Blood pressure check.** / Every year.  Lipid and cholesterol check.** / Every 5 years beginning at age 20  years.  Lung cancer screening. / Every year if you are aged 21-80 years and have a 30-pack-year history of smoking and currently smoke or have quit within the past 15 years. Yearly screening is stopped once you have quit smoking for at least 15 years or develop a health problem that would prevent you from having lung cancer treatment.  Clinical breast exam.** / Every year after age 50 years.  BRCA-related cancer risk assessment.** / For women who have family members with a BRCA-related cancer (breast, ovarian, tubal, or peritoneal cancers).  Mammogram.** / Every year beginning at age 85 years and continuing for as long as you are in good health. Consult with your health care provider.  Pap test.** / Every 3 years starting at age 73 years through age 61 or 77 years with 3 consecutive normal Pap tests. Testing can be stopped between 65 and 70 years with 3 consecutive normal Pap tests and no abnormal Pap or HPV tests in the past 10 years.  HPV screening.** / Every 3 years from ages 12 years through ages 34 or 42 years with a history of 3 consecutive normal Pap tests. Testing can be stopped between 65 and 70 years with 3 consecutive normal Pap tests and no abnormal Pap or HPV tests in the past 10 years.  Fecal occult blood test (FOBT) of stool. / Every year beginning at age 74 years and continuing until age 46 years. You may not need to do this test if you get a colonoscopy every 10 years.  Flexible sigmoidoscopy or colonoscopy.** / Every 5 years for a flexible sigmoidoscopy or every 10 years for a colonoscopy beginning at age 15 years and continuing until age 30 years.  Hepatitis C blood test.** / For all people born from [birth year] through 1963 and any individual with known risks for hepatitis C.  Osteoporosis screening.** / A one-time screening for women ages 38 years and over and women at risk for fractures or osteoporosis.  Skin self-exam. / Monthly.  Influenza vaccine. / Every year.  Tetanus,  diphtheria, and acellular pertussis (Tdap/Td) vaccine.** / 1 dose of Td every 10 years.  Varicella vaccine.** / Consult your health care provider.  Zoster vaccine.** / 1 dose for adults aged 35 years or older.  Pneumococcal 13-valent conjugate (PCV13) vaccine.** / Consult your health care provider.  Pneumococcal polysaccharide (PPSV23) vaccine.** / 1 dose for all adults aged 72 years and older.  Meningococcal vaccine.** / Consult your health care provider.  Hepatitis A vaccine.** / Consult your health care provider.  Hepatitis B vaccine.** / Consult your health care provider.  Haemophilus influenzae type b (Hib) vaccine.** / Consult your health care provider. ** Family history and personal history of risk and conditions may change your health care provider's recommendations.   This information is not intended to replace advice given to you by your health care provider. Make sure you discuss any questions you have with your health care provider.   Document Released: 09/10/2001 Document Revised: 08/05/2014 Document Reviewed: 12/10/2010 Elsevier Interactive Patient Education [employer].

## 2016-05-28 NOTE — Progress Notes (Signed)
Pre visit review using our clinic review tool, if applicable. No additional management support is needed unless otherwise documented below in the visit note. 

## 2016-05-28 NOTE — Assessment & Plan Note (Addendum)
Encouraged to get adequate exercise, calcium and vitamin d intake 

## 2016-05-29 ENCOUNTER — Telehealth: Payer: Self-pay | Admitting: Family Medicine

## 2016-05-29 NOTE — Telephone Encounter (Signed)
Patient is going to schedule an appointment with an orthopedic, which she has a referral to. However, she would like to have that doctor call her daughter Merideth Abbey) to schedule the appointment rather than her. I called over to Dr. Reather Littler office to inform them to call her daughter to schedule her appt. They are aware.   Daughter's phone: [phone]

## 2016-05-30 ENCOUNTER — Ambulatory Visit (INDEPENDENT_AMBULATORY_CARE_PROVIDER_SITE_OTHER): Payer: Medicare Other | Admitting: Behavioral Health

## 2016-05-30 DIAGNOSIS — Z23 Encounter for immunization: Secondary | ICD-10-CM

## 2016-05-30 NOTE — Progress Notes (Signed)
Pre visit review using our clinic review tool, if applicable. No additional management support is needed unless otherwise documented below in the visit note.  Patient in clinic today for Pneumococcal vaccination (23). Received verbal order from Dr. Charlett Blake. IM given in Left Deltoid. Patient tolerated injection well.

## 2016-06-02 ENCOUNTER — Encounter: Payer: Self-pay | Admitting: Family Medicine

## 2016-06-02 DIAGNOSIS — Z0000 Encounter for general adult medical examination without abnormal findings: Secondary | ICD-10-CM

## 2016-06-02 HISTORY — DX: Encounter for general adult medical examination without abnormal findings: Z00.00

## 2016-06-02 NOTE — Assessment & Plan Note (Signed)
Patient encouraged to maintain heart healthy diet, regular exercise, adequate sleep. Consider daily probiotics. Take medications as prescribed 

## 2016-06-02 NOTE — Progress Notes (Addendum)
Patient ID: Jennifer Wilkins, female   DOB: 1932-02-21, 82 y.o.   MRN: [id number]   Subjective:    Patient ID: Jennifer Wilkins, female    DOB: [birth year], 83 y.o.   MRN: [id number]  No chief complaint on file.   HPI Patient is in today for annual preventative exam and follow up. She is still sad over the recent death of her husband on 02-Mar-2023 of course but is managing well most days. Her major physical complaint is her ongoing low back pain. She is working with Dr Nelva Bush and after MRI he is recommending surgery but she has been hesitant to proceed with surgery. She is now considering. No incontinence noted. No recent falls. She is doing well with ADLs despite her pain. Denies CP/palp/SOB/HA/congestion/fevers/GI or GU c/o. Taking meds as prescribed  Past Medical History:  Diagnosis Date  . Arthritis   . Elevated BP 04/21/2014  . HTN (hypertension)   . Hypercholesteremia   . Hypocalcemia 06/19/2013  . Low back pain radiating to both legs 08/27/2015  . Medicare annual wellness visit, subsequent 05/03/2015  . Preventative health care 06/02/2016  . Stress reaction 04/25/2015    Past Surgical History:  Procedure Laterality Date  . GALLBLADDER SURGERY  1984   Gallstone removal    Family History  Problem Relation Age of Onset  . Osteoporosis Mother   . Ulcers Father   . Kidney disease Paternal Grandfather     Social History   Social History  . Marital status: Married    Spouse name: N/A  . Number of children: N/A  . Years of education: N/A   Occupational History  . Not on file.   Social History Main Topics  . Smoking status: Never Smoker  . Smokeless tobacco: Never Used  . Alcohol use No  . Drug use: Unknown  . Sexual activity: Not on file   Other Topics Concern  . Not on file   Social History Narrative   Widowed July 2017    Outpatient Medications Prior to Visit  Medication Sig Dispense Refill  . aspirin 81 MG tablet Take 81 mg by mouth daily.    . Calcium Carbonate-Vitamin D  (CALCIUM 600/VITAMIN D) 600-400 MG-UNIT per tablet Take 1 tablet by mouth 2 (two) times daily.    . carvedilol (COREG) 12.5 MG tablet TAKE 1 TABLET DAILY 90 tablet 0  . Cyanocobalamin (VITAMIN B-12 CR) 1500 MCG TBCR Take by mouth daily.    . hydrochlorothiazide (MICROZIDE) 12.5 MG capsule TAKE 1 CAPSULE DAILY 90 capsule 0  . Misc Natural Products (OSTEO BI-FLEX ADV JOINT SHIELD PO) Take by mouth daily.    . simvastatin (ZOCOR) 40 MG tablet TAKE 1 TABLET EVERY EVENING 90 tablet 0   No facility-administered medications prior to visit.     No Known Allergies  Review of Systems  Constitutional: Negative for chills, fever and malaise/fatigue.  HENT: Negative for congestion and hearing loss.   Eyes: Negative for discharge.  Respiratory: Negative for cough, sputum production and shortness of breath.   Cardiovascular: Negative for chest pain, palpitations and leg swelling.  Gastrointestinal: Negative for abdominal pain, blood in stool, constipation, diarrhea, heartburn, nausea and vomiting.  Genitourinary: Negative for dysuria, frequency, hematuria and urgency.  Musculoskeletal: Positive for back pain and joint pain. Negative for falls and myalgias.  Skin: Negative for rash.  Neurological: Negative for dizziness, sensory change, loss of consciousness, weakness and headaches.  Endo/Heme/Allergies: Negative for environmental allergies. Does not bruise/bleed easily.  Psychiatric/Behavioral:  Positive for depression. Negative for suicidal ideas. The patient is not nervous/anxious and does not have insomnia.        Objective:    Physical Exam  Constitutional: She is oriented to person, place, and time. She appears well-developed and well-nourished. No distress.  HENT:  Head: Normocephalic and atraumatic.  Nose: Nose normal.  Eyes: Conjunctivae are normal. Right eye exhibits no discharge. Left eye exhibits no discharge.  Neck: Normal range of motion. Neck supple. No thyromegaly present.    Cardiovascular: Normal rate, regular rhythm and normal heart sounds.   No murmur heard. Pulmonary/Chest: Effort normal and breath sounds normal. No respiratory distress.  Abdominal: Soft. Bowel sounds are normal. She exhibits no distension and no mass. There is no tenderness.  Musculoskeletal: She exhibits no edema.  Lymphadenopathy:    She has no cervical adenopathy.  Neurological: She is alert and oriented to person, place, and time.  Skin: Skin is warm and dry.  Psychiatric: She has a normal mood and affect. Her behavior is normal.  Nursing note and vitals reviewed.   BP (!) 152/92 (BP Location: Left Arm, Patient Position: Sitting, Cuff Size: Normal)   Pulse 62   Temp 97.6 F (36.4 C) (Oral)   Wt 108 lb 3.2 oz (49.1 kg)   SpO2 99%   BMI 25.15 kg/m  Wt Readings from Last 3 Encounters:  05/28/16 108 lb 3.2 oz (49.1 kg)  11/21/15 112 lb 2 oz (50.9 kg)  08/15/15 119 lb 8 oz (54.2 kg)     Lab Results  Component Value Date   WBC 5.2 05/28/2016   HGB 14.0 05/28/2016   HCT 41.7 05/28/2016   PLT 210.0 05/28/2016   GLUCOSE 95 05/28/2016   CHOL 189 05/28/2016   TRIG 83.0 05/28/2016   HDL 94.10 05/28/2016   LDLCALC 78 05/28/2016   ALT 17 05/28/2016   AST 19 05/28/2016   NA 142 05/28/2016   K 3.9 05/28/2016   CL 104 05/28/2016   CREATININE 0.76 05/28/2016   BUN 23 05/28/2016   CO2 29 05/28/2016   TSH 1.81 05/28/2016    Lab Results  Component Value Date   TSH 1.81 05/28/2016   Lab Results  Component Value Date   WBC 5.2 05/28/2016   HGB 14.0 05/28/2016   HCT 41.7 05/28/2016   MCV 87.5 05/28/2016   PLT 210.0 05/28/2016   Lab Results  Component Value Date   NA 142 05/28/2016   K 3.9 05/28/2016   CO2 29 05/28/2016   GLUCOSE 95 05/28/2016   BUN 23 05/28/2016   CREATININE 0.76 05/28/2016   BILITOT 0.7 05/28/2016   ALKPHOS 54 05/28/2016   AST 19 05/28/2016   ALT 17 05/28/2016   PROT 7.0 05/28/2016   ALBUMIN 4.1 05/28/2016   CALCIUM 9.4 05/28/2016   GFR  77.28 05/28/2016   Lab Results  Component Value Date   CHOL 189 05/28/2016   Lab Results  Component Value Date   HDL 94.10 05/28/2016   Lab Results  Component Value Date   LDLCALC 78 05/28/2016   Lab Results  Component Value Date   TRIG 83.0 05/28/2016   Lab Results  Component Value Date   CHOLHDL 2 05/28/2016   No results found for: HGBA1C     Assessment & Plan:   Problem List Items Addressed This Visit    Osteoporosis    Encouraged to get adequate exercise, calcium and vitamin d intake      Relevant Orders   VITAMIN D  25 Hydroxy (Vit-D Deficiency, Fractures) (Completed)   Hyperlipidemia, mixed    Encouraged heart healthy diet, increase exercise, avoid trans fats, consider a krill oil cap daily      Relevant Orders   Lipid panel (Completed)   Vitamin D deficiency    Check level and take daily supplements      Relevant Orders   VITAMIN D 25 Hydroxy (Vit-D Deficiency, Fractures) (Completed)   Essential hypertension    Well controlled, no changes to meds. Encouraged heart healthy diet such as the DASH diet and exercise as tolerated.       Stress reaction    She was widowed a few months ago and is sad but managing well all things considered.       Low back pain radiating to both legs    Struggling with pain x 10 month. Has done PT and is now doing. Review of [place] notes reveal severe spinal stenosis at L4-5 and surgery has beenn recommended by Dr Nelva Bush. Will make new referral to Dr Tonita Cong whom Dr Nelva Bush recommended.      Preventative health care    Patient encouraged to maintain heart healthy diet, regular exercise, adequate sleep. Consider daily probiotics. Take medications as prescribed       Other Visit Diagnoses    Spinal stenosis of lumbar region, unspecified whether neurogenic claudication present    -  Primary   Relevant Orders   Ambulatory referral to Orthopedic Surgery   Hypertension, unspecified type       Relevant Orders   CBC (Completed)    TSH (Completed)   Comprehensive metabolic panel (Completed)      I am having Ms. Deutschman maintain her Vitamin B-12 CR, aspirin, Misc Natural Products (OSTEO BI-FLEX ADV JOINT SHIELD PO), Calcium Carbonate-Vitamin D, carvedilol, hydrochlorothiazide, and simvastatin.  No orders of the defined types were placed in this encounter.    Penni Homans, MD

## 2016-06-02 NOTE — Assessment & Plan Note (Signed)
She was widowed a few months ago and is sad but managing well all things considered.

## 2016-06-02 NOTE — Assessment & Plan Note (Signed)
Well controlled, no changes to meds. Encouraged heart healthy diet such as the DASH diet and exercise as tolerated.  °

## 2016-06-11 DIAGNOSIS — M48062 Spinal stenosis, lumbar region with neurogenic claudication: Secondary | ICD-10-CM | POA: Diagnosis not present

## 2016-06-11 DIAGNOSIS — M545 Low back pain: Secondary | ICD-10-CM | POA: Diagnosis not present

## 2016-06-19 ENCOUNTER — Telehealth: Payer: Self-pay | Admitting: Family Medicine

## 2016-06-19 NOTE — Telephone Encounter (Signed)
Will contact patient when medication arrives at office/SLS 11/22

## 2016-06-19 NOTE — Telephone Encounter (Signed)
Prolia benefits verified No PA required No Out of pocket for the patient  Message sent to Gilmore [facility] to order Prolia,  Shirlean Mylar will you contact the patient to schedule?

## 2016-07-01 NOTE — Telephone Encounter (Signed)
Prolia is in fridge for pt. 

## 2016-07-02 DIAGNOSIS — M48062 Spinal stenosis, lumbar region with neurogenic claudication: Secondary | ICD-10-CM | POA: Diagnosis not present

## 2016-07-02 NOTE — Telephone Encounter (Signed)
Patient informed prolia has arrived and is in the office.  She is due now for her next shot. Last one was 01/09/2016. Patient will call back to schedule nurse visit for injection once she arranges her transportation.

## 2016-07-04 ENCOUNTER — Ambulatory Visit (INDEPENDENT_AMBULATORY_CARE_PROVIDER_SITE_OTHER): Payer: Medicare Other

## 2016-07-04 DIAGNOSIS — M810 Age-related osteoporosis without current pathological fracture: Secondary | ICD-10-CM | POA: Diagnosis not present

## 2016-07-04 MED ORDER — DENOSUMAB 60 MG/ML ~~LOC~~ SOLN
60.0000 mg | Freq: Once | SUBCUTANEOUS | Status: AC
  Administered 2016-07-04: 60 mg via SUBCUTANEOUS

## 2016-07-04 NOTE — Progress Notes (Signed)
Pre visit review using our clinic review tool, if applicable. No additional management support is needed unless otherwise documented below in the visit note.  Pt in clinic today for Prolia injection.  Prolia injection given subcutaneously in right arm.  Pt tolerated injection well.  No signs of a reaction noted.    Pt informed that next injection will be six months from today.  Pt stated understanding.

## 2016-07-10 ENCOUNTER — Telehealth: Payer: Self-pay | Admitting: *Deleted

## 2016-07-10 NOTE — Telephone Encounter (Signed)
Called patient and left message to return call to schedule AWV w/ [facility]. 

## 2016-07-16 DIAGNOSIS — M5416 Radiculopathy, lumbar region: Secondary | ICD-10-CM | POA: Diagnosis not present

## 2016-07-16 DIAGNOSIS — M5441 Lumbago with sciatica, right side: Secondary | ICD-10-CM | POA: Diagnosis not present

## 2016-07-16 DIAGNOSIS — M48062 Spinal stenosis, lumbar region with neurogenic claudication: Secondary | ICD-10-CM | POA: Diagnosis not present

## 2016-07-16 DIAGNOSIS — G8929 Other chronic pain: Secondary | ICD-10-CM | POA: Diagnosis not present

## 2016-08-20 ENCOUNTER — Ambulatory Visit (INDEPENDENT_AMBULATORY_CARE_PROVIDER_SITE_OTHER): Payer: Medicare Other | Admitting: Family Medicine

## 2016-08-20 VITALS — BP 142/84 | HR 84 | Temp 97.9°F | Wt 111.2 lb

## 2016-08-20 DIAGNOSIS — I10 Essential (primary) hypertension: Secondary | ICD-10-CM

## 2016-08-20 DIAGNOSIS — E782 Mixed hyperlipidemia: Secondary | ICD-10-CM | POA: Diagnosis not present

## 2016-08-20 DIAGNOSIS — M79604 Pain in right leg: Secondary | ICD-10-CM

## 2016-08-20 DIAGNOSIS — E559 Vitamin D deficiency, unspecified: Secondary | ICD-10-CM

## 2016-08-20 DIAGNOSIS — M545 Low back pain: Secondary | ICD-10-CM | POA: Diagnosis not present

## 2016-08-20 DIAGNOSIS — Z01818 Encounter for other preprocedural examination: Secondary | ICD-10-CM | POA: Diagnosis not present

## 2016-08-20 DIAGNOSIS — M810 Age-related osteoporosis without current pathological fracture: Secondary | ICD-10-CM

## 2016-08-20 NOTE — Assessment & Plan Note (Signed)
Encouraged to get adequate exercise, calcium and vitamin d intake 

## 2016-08-20 NOTE — Progress Notes (Signed)
Pre visit review using our clinic review tool, if applicable. No additional management support is needed unless otherwise documented below in the visit note. 

## 2016-08-20 NOTE — Assessment & Plan Note (Signed)
Well controlled, no changes to meds. Encouraged heart healthy diet such as the DASH diet and exercise as tolerated.  °

## 2016-08-20 NOTE — Assessment & Plan Note (Signed)
Encouraged heart healthy diet, increase exercise, avoid trans fats, consider a krill oil cap daily 

## 2016-08-20 NOTE — Progress Notes (Signed)
Subjective:    Patient ID: Jennifer Wilkins, female    DOB: October 05, 1931, 83 y.o.   MRN: [id number]  Chief Complaint  Patient presents with  . surgical clearance    HPI Patient is in today for surgical clearance visit. Patient has no acute concerns but is in daily low back pain with radicular symptoms down both legs. She underwent an epidural injection in her low back and she got 1 day of relief before her pain returns. At this time her pain is constant and she is ready to proceed with surgical intervention to see if she can get some relief. She is aware of possible complications and is still wants to proceed. No recent fall or injury. No weakness or incontinence. Denies CP/palp/SOB/HA/congestion/fevers/GI or GU c/o. Taking meds as prescribed  Past Medical History:  Diagnosis Date  . Arthritis   . Elevated BP 04/21/2014  . HTN (hypertension)   . Hypercholesteremia   . Hypocalcemia 06/19/2013  . Low back pain radiating to both legs 08/27/2015  . Medicare annual wellness visit, subsequent 05/03/2015  . Preventative health care 06/02/2016  . Stress reaction 04/25/2015    Past Surgical History:  Procedure Laterality Date  . GALLBLADDER SURGERY  1984   Gallstone removal    Family History  Problem Relation Age of Onset  . Osteoporosis Mother   . Ulcers Father   . Kidney disease Paternal Grandfather     Social History   Social History  . Marital status: Married    Spouse name: N/A  . Number of children: N/A  . Years of education: N/A   Occupational History  . Not on file.   Social History Main Topics  . Smoking status: Never Smoker  . Smokeless tobacco: Never Used  . Alcohol use No  . Drug use: Unknown  . Sexual activity: Not on file   Other Topics Concern  . Not on file   Social History Narrative   Widowed July 2017    Outpatient Medications Prior to Visit  Medication Sig Dispense Refill  . aspirin 81 MG tablet Take 81 mg by mouth daily.    . Calcium Carbonate-Vitamin  D (CALCIUM 600/VITAMIN D) 600-400 MG-UNIT per tablet Take 1 tablet by mouth 2 (two) times daily.    . carvedilol (COREG) 12.5 MG tablet TAKE 1 TABLET DAILY 90 tablet 0  . Cyanocobalamin (VITAMIN B-12 CR) 1500 MCG TBCR Take by mouth daily.    . hydrochlorothiazide (MICROZIDE) 12.5 MG capsule TAKE 1 CAPSULE DAILY 90 capsule 0  . Misc Natural Products (OSTEO BI-FLEX ADV JOINT SHIELD PO) Take by mouth daily.    . simvastatin (ZOCOR) 40 MG tablet TAKE 1 TABLET EVERY EVENING 90 tablet 0   No facility-administered medications prior to visit.     No Known Allergies  Review of Systems  Constitutional: Negative for fever and malaise/fatigue.  HENT: Negative for congestion.   Eyes: Negative for blurred vision.  Respiratory: Negative for cough and shortness of breath.   Cardiovascular: Negative for chest pain, palpitations and leg swelling.  Gastrointestinal: Positive for constipation. Negative for vomiting.  Musculoskeletal: Positive for back pain and joint pain.  Skin: Negative for rash.  Neurological: Negative for loss of consciousness and headaches.       Objective:    Physical Exam  Constitutional: She is oriented to person, place, and time. She appears well-developed and well-nourished. No distress.  HENT:  Head: Normocephalic and atraumatic.  Eyes: Conjunctivae are normal.  Neck: Normal range of  motion. No thyromegaly present.  Cardiovascular: Normal rate and regular rhythm.   Pulmonary/Chest: Effort normal and breath sounds normal. She has no wheezes.  Abdominal: Soft. Bowel sounds are normal. There is no tenderness. There is no rebound and no guarding.  Musculoskeletal: Normal range of motion. She exhibits no edema or deformity.  Neurological: She is alert and oriented to person, place, and time.  Skin: Skin is warm and dry. She is not diaphoretic.  Psychiatric: She has a normal mood and affect.    BP (!) 142/84 (BP Location: Left Arm, Patient Position: Sitting, Cuff Size:  Normal)   Pulse 84   Temp 97.9 F (36.6 C) (Oral)   Wt 111 lb 3.2 oz (50.4 kg)   SpO2 97%   BMI 25.85 kg/m  Wt Readings from Last 3 Encounters:  08/20/16 111 lb 3.2 oz (50.4 kg)  05/28/16 108 lb 3.2 oz (49.1 kg)  11/21/15 112 lb 2 oz (50.9 kg)     Lab Results  Component Value Date   WBC 5.2 05/28/2016   HGB 14.0 05/28/2016   HCT 41.7 05/28/2016   PLT 210.0 05/28/2016   GLUCOSE 95 05/28/2016   CHOL 189 05/28/2016   TRIG 83.0 05/28/2016   HDL 94.10 05/28/2016   LDLCALC 78 05/28/2016   ALT 17 05/28/2016   AST 19 05/28/2016   NA 142 05/28/2016   K 3.9 05/28/2016   CL 104 05/28/2016   CREATININE 0.76 05/28/2016   BUN 23 05/28/2016   CO2 29 05/28/2016   TSH 1.81 05/28/2016    Lab Results  Component Value Date   TSH 1.81 05/28/2016   Lab Results  Component Value Date   WBC 5.2 05/28/2016   HGB 14.0 05/28/2016   HCT 41.7 05/28/2016   MCV 87.5 05/28/2016   PLT 210.0 05/28/2016   Lab Results  Component Value Date   NA 142 05/28/2016   K 3.9 05/28/2016   CO2 29 05/28/2016   GLUCOSE 95 05/28/2016   BUN 23 05/28/2016   CREATININE 0.76 05/28/2016   BILITOT 0.7 05/28/2016   ALKPHOS 54 05/28/2016   AST 19 05/28/2016   ALT 17 05/28/2016   PROT 7.0 05/28/2016   ALBUMIN 4.1 05/28/2016   CALCIUM 9.4 05/28/2016   GFR 77.28 05/28/2016   Lab Results  Component Value Date   CHOL 189 05/28/2016   Lab Results  Component Value Date   HDL 94.10 05/28/2016   Lab Results  Component Value Date   LDLCALC 78 05/28/2016   Lab Results  Component Value Date   TRIG 83.0 05/28/2016   Lab Results  Component Value Date   CHOLHDL 2 05/28/2016   No results found for: HGBA1C I acted as a Education administrator for Dr. Charlett Blake. Princess, RMA     Assessment & Plan:   Problem List Items Addressed This Visit    Osteoporosis    Encouraged to get adequate exercise, calcium and vitamin d intake      Hyperlipidemia, mixed    Encouraged heart healthy diet, increase exercise, avoid trans  fats, consider a krill oil cap daily      Vitamin D deficiency    Encouraged daily Vitamin D supplements and continued monitoring.       Essential hypertension    Well controlled, no changes to meds. Encouraged heart healthy diet such as the DASH diet and exercise as tolerated.       Low back pain radiating to both legs    Is ready to proceed with surgery  she has been following with orthopaedics and after a steroid injection in lumbar spine she had one day of relief before symptoms returned so she is ready to proceed with surgical decompression of lumbar spine with Dr Tonita Cong. She is surgically cleared today as long as there are no clinical changes in her medical condition. She will hold all OTC vitamins and NSAIDs for the week leading up to surgery. Only Tylenol for pain the week leading up to the surgery       Other Visit Diagnoses    Preoperative clearance    -  Primary   Relevant Orders   EKG 12-Lead (Completed)      I am having Ms. Nickleson maintain her Vitamin B-12 CR, aspirin, Misc Natural Products (OSTEO BI-FLEX ADV JOINT SHIELD PO), Calcium Carbonate-Vitamin D, carvedilol, hydrochlorothiazide, and simvastatin.  No orders of the defined types were placed in this encounter.   CMA served as Education administrator during this visit. History, Physical and Plan performed by medical provider. Documentation and orders reviewed and attested to.  Penni Homans, MD

## 2016-08-20 NOTE — Assessment & Plan Note (Signed)
Is ready to proceed with surgery she has been following with orthopaedics and after a steroid injection in lumbar spine she had one day of relief before symptoms returned so she is ready to proceed with surgical decompression of lumbar spine with Dr Tonita Cong. She is surgically cleared today as long as there are no clinical changes in her medical condition. She will hold all OTC vitamins and NSAIDs for the week leading up to surgery. Only Tylenol for pain the week leading up to the surgery

## 2016-08-20 NOTE — Patient Instructions (Addendum)
2 pills in the morning of Aleve, 2 pills at night Aleve, 2 Tylenol in the evening as needed for pain relief.   Preventive Care 90 Years and Older, Female Preventive care refers to lifestyle choices and visits with your health care provider that can promote health and wellness. What does preventive care include?  A yearly physical exam. This is also called an annual well check.  Dental exams once or twice a year.  Routine eye exams. Ask your health care provider how often you should have your eyes checked.  Personal lifestyle choices, including:  Daily care of your teeth and gums.  Regular physical activity.  Eating a healthy diet.  Avoiding tobacco and drug use.  Limiting alcohol use.  Practicing safe sex.  Taking low-dose aspirin every day.  Taking vitamin and mineral supplements as recommended by your health care provider. What happens during an annual well check? The services and screenings done by your health care provider during your annual well check will depend on your age, overall health, lifestyle risk factors, and family history of disease. Counseling  Your health care provider may ask you questions about your:  Alcohol use.  Tobacco use.  Drug use.  Emotional well-being.  Home and relationship well-being.  Sexual activity.  Eating habits.  History of falls.  Memory and ability to understand (cognition).  Work and work Statistician.  Reproductive health. Screening  You may have the following tests or measurements:  Height, weight, and BMI.  Blood pressure.  Lipid and cholesterol levels. These may be checked every 5 years, or more frequently if you are over 60 years old.  Skin check.  Lung cancer screening. You may have this screening every year starting at age 81 if you have a 30-pack-year history of smoking and currently smoke or have quit within the past 15 years.  Fecal occult blood test (FOBT) of the stool. You may have this test every  year starting at age 12.  Flexible sigmoidoscopy or colonoscopy. You may have a sigmoidoscopy every 5 years or a colonoscopy every 10 years starting at age 37.  Hepatitis C blood test.  Hepatitis B blood test.  Sexually transmitted disease (STD) testing.  Diabetes screening. This is done by checking your blood sugar (glucose) after you have not eaten for a while (fasting). You may have this done every 1-3 years.  Bone density scan. This is done to screen for osteoporosis. You may have this done starting at age 81.  Mammogram. This may be done every 1-2 years. Talk to your health care provider about how often you should have regular mammograms. Talk with your health care provider about your test results, treatment options, and if necessary, the need for more tests. Vaccines  Your health care provider may recommend certain vaccines, such as:  Influenza vaccine. This is recommended every year.  Tetanus, diphtheria, and acellular pertussis (Tdap, Td) vaccine. You may need a Td booster every 10 years.  Varicella vaccine. You may need this if you have not been vaccinated.  Zoster vaccine. You may need this after age 81.  Measles, mumps, and rubella (MMR) vaccine. You may need at least one dose of MMR if you were born in 1957 or later. You may also need a second dose.  Pneumococcal 13-valent conjugate (PCV13) vaccine. One dose is recommended after age 81.  Pneumococcal polysaccharide (PPSV23) vaccine. One dose is recommended after age 81.  Meningococcal vaccine. You may need this if you have certain conditions.  Hepatitis A  vaccine. You may need this if you have certain conditions or if you travel or work in places where you may be exposed to hepatitis A.  Hepatitis B vaccine. You may need this if you have certain conditions or if you travel or work in places where you may be exposed to hepatitis B.  Haemophilus influenzae type b (Hib) vaccine. You may need this if you have certain  conditions. Talk to your health care provider about which screenings and vaccines you need and how often you need them. This information is not intended to replace advice given to you by your health care provider. Make sure you discuss any questions you have with your health care provider. Document Released: 08/11/2015 Document Revised: 04/03/2016 Document Reviewed: 05/16/2015 Elsevier Interactive Patient Education  2017 [employer].

## 2016-08-23 ENCOUNTER — Ambulatory Visit: Payer: Self-pay | Admitting: Orthopedic Surgery

## 2016-08-25 NOTE — Assessment & Plan Note (Signed)
Encouraged daily Vitamin D supplements and continued monitoring.

## 2016-08-30 NOTE — Patient Instructions (Signed)
ARIHANA GOEPFERT  08/30/2016   Your procedure is scheduled on: 09/11/16  Report to [facility] Main  Entrance take [place]  elevators to 3rd floor to  [place] at    [phone].  Call this number if you have problems the morning of surgery [phone]   Remember: ONLY 1 PERSON MAY GO WITH YOU TO SHORT STAY TO GET  READY MORNING OF [place].  Do not eat food or drink liquids :After Midnight.     Take these medicines the morning of surgery with A SIP OF WATER: Coreg                                You may not have any metal on your body including hair pins and              piercings  Do not wear jewelry, make-up, lotions, powders or perfumes, deodorant             Do not wear nail polish.  Do not shave  48 hours prior to surgery.                 Do not bring valuables to the hospital. [place].  Contacts, dentures or bridgework may not be worn into surgery.  Leave suitcase in the car. After surgery it may be brought to your room.                Please read over the following fact sheets you were given: _____________________________________________________________________             [facility] - Preparing for Surgery Before surgery, you can play an important role.  Because skin is not sterile, your skin needs to be as free of germs as possible.  You can reduce the number of germs on your skin by washing with CHG (chlorahexidine gluconate) soap before surgery.  CHG is an antiseptic cleaner which kills germs and bonds with the skin to continue killing germs even after washing. Please DO NOT use if you have an allergy to CHG or antibacterial soaps.  If your skin becomes reddened/irritated stop using the CHG and inform your nurse when you arrive at Short Stay. Do not shave (including legs and underarms) for at least 48 hours prior to the first CHG shower.  You may shave your face/neck. Please follow these  instructions carefully:  1.  Shower with CHG Soap the night before surgery and the  morning of Surgery.  2.  If you choose to wash your hair, wash your hair first as usual with your  normal  shampoo.  3.  After you shampoo, rinse your hair and body thoroughly to remove the  shampoo.                           4.  Use CHG as you would any other liquid soap.  You can apply chg directly  to the skin and wash                       Gently with a scrungie or clean washcloth.  5.  Apply the CHG Soap to your body ONLY FROM  THE NECK DOWN.   Do not use on face/ open                           Wound or open sores. Avoid contact with eyes, ears mouth and genitals (private parts).                       Wash face,  Genitals (private parts) with your normal soap.             6.  Wash thoroughly, paying special attention to the area where your surgery  will be performed.  7.  Thoroughly rinse your body with warm water from the neck down.  8.  DO NOT shower/wash with your normal soap after using and rinsing off  the CHG Soap.                9.  Pat yourself dry with a clean towel.            10.  Wear clean pajamas.            11.  Place clean sheets on your bed the night of your first shower and do not  sleep with pets. Day of Surgery : Do not apply any lotions/deodorants the morning of surgery.  Please wear clean clothes to the hospital/surgery center.  FAILURE TO FOLLOW THESE INSTRUCTIONS MAY RESULT IN THE CANCELLATION OF YOUR SURGERY PATIENT SIGNATURE_________________________________  NURSE SIGNATURE__________________________________  ________________________________________________________________________   Adam Phenix  An incentive spirometer is a tool that can help keep your lungs clear and active. This tool measures how well you are filling your lungs with each breath. Taking long deep breaths may help reverse or decrease the chance of developing breathing (pulmonary) problems (especially  infection) following:  A long period of time when you are unable to move or be active. BEFORE THE PROCEDURE   If the spirometer includes an indicator to show your best effort, your nurse or respiratory therapist will set it to a desired goal.  If possible, sit up straight or lean slightly forward. Try not to slouch.  Hold the incentive spirometer in an upright position. INSTRUCTIONS FOR USE  1. Sit on the edge of your bed if possible, or sit up as far as you can in bed or on a chair. 2. Hold the incentive spirometer in an upright position. 3. Breathe out normally. 4. Place the mouthpiece in your mouth and seal your lips tightly around it. 5. Breathe in slowly and as deeply as possible, raising the piston or the ball toward the top of the column. 6. Hold your breath for 3-5 seconds or for as long as possible. Allow the piston or ball to fall to the bottom of the column. 7. Remove the mouthpiece from your mouth and breathe out normally. 8. Rest for a few seconds and repeat Steps 1 through 7 at least 10 times every 1-2 hours when you are awake. Take your time and take a few normal breaths between deep breaths. 9. The spirometer may include an indicator to show your best effort. Use the indicator as a goal to work toward during each repetition. 10. After each set of 10 deep breaths, practice coughing to be sure your lungs are clear. If you have an incision (the cut made at the time of surgery), support your incision when coughing by placing a pillow or rolled up towels firmly against it. Once you are  able to get out of bed, walk around indoors and cough well. You may stop using the incentive spirometer when instructed by your caregiver.  RISKS AND COMPLICATIONS  Take your time so you do not get dizzy or light-headed.  If you are in pain, you may need to take or ask for pain medication before doing incentive spirometry. It is harder to take a deep breath if you are having pain. AFTER  USE  Rest and breathe slowly and easily.  It can be helpful to keep track of a log of your progress. Your caregiver can provide you with a simple table to help with this. If you are using the spirometer at home, follow these instructions: [place] IF:   You are having difficultly using the spirometer.  You have trouble using the spirometer as often as instructed.  Your pain medication is not giving enough relief while using the spirometer.  You develop fever of 100.5 F (38.1 C) or higher. SEEK IMMEDIATE MEDICAL CARE IF:   You cough up bloody sputum that had not been present before.  You develop fever of 102 F (38.9 C) or greater.  You develop worsening pain at or near the incision site. MAKE SURE YOU:   Understand these instructions.  Will watch your condition.  Will get help right away if you are not doing well or get worse. Document Released: 11/25/2006 Document Revised: 10/07/2011 Document Reviewed: 01/26/2007 [facility] Patient Information 2014 [place], [place].   ________________________________________________________________________

## 2016-09-03 ENCOUNTER — Ambulatory Visit (HOSPITAL_COMMUNITY)
Admission: RE | Admit: 2016-09-03 | Discharge: 2016-09-03 | Disposition: A | Discharge status: Home / Self Care | Payer: Medicare Other | Source: Ambulatory Visit | Attending: Orthopedic Surgery | Admitting: Orthopedic Surgery

## 2016-09-03 ENCOUNTER — Encounter (HOSPITAL_COMMUNITY)
Admission: RE | Admit: 2016-09-03 | Discharge: 2016-09-03 | Disposition: A | Discharge status: Home / Self Care | Payer: Medicare Other | Source: Ambulatory Visit | Attending: Specialist | Admitting: Specialist

## 2016-09-03 ENCOUNTER — Ambulatory Visit: Payer: Self-pay | Admitting: Orthopedic Surgery

## 2016-09-03 ENCOUNTER — Encounter (HOSPITAL_COMMUNITY): Payer: Self-pay

## 2016-09-03 DIAGNOSIS — M5126 Other intervertebral disc displacement, lumbar region: Secondary | ICD-10-CM

## 2016-09-03 DIAGNOSIS — M4316 Spondylolisthesis, lumbar region: Secondary | ICD-10-CM | POA: Diagnosis not present

## 2016-09-03 DIAGNOSIS — M5136 Other intervertebral disc degeneration, lumbar region: Secondary | ICD-10-CM | POA: Insufficient documentation

## 2016-09-03 DIAGNOSIS — M47816 Spondylosis without myelopathy or radiculopathy, lumbar region: Secondary | ICD-10-CM | POA: Diagnosis not present

## 2016-09-03 LAB — BASIC METABOLIC PANEL
Anion gap: 7 (ref 5–15)
BUN: 25 mg/dL — AB (ref 6–20)
CHLORIDE: 106 mmol/L (ref 101–111)
CO2: 28 mmol/L (ref 22–32)
CREATININE: 0.94 mg/dL (ref 0.44–1.00)
Calcium: 9.1 mg/dL (ref 8.9–10.3)
GFR calc Af Amer: >60 mL/min (ref >60)
GFR calc non Af Amer: 55 mL/min — ABNORMAL LOW (ref >60)
GLUCOSE: 99 mg/dL (ref 65–99)
POTASSIUM: 4.2 mmol/L (ref 3.5–5.1)
Sodium: 141 mmol/L (ref 135–145)

## 2016-09-03 LAB — CBC
HEMATOCRIT: 40.7 % (ref 36.0–46.0)
Hemoglobin: 13.6 g/dL (ref 12.0–15.0)
MCH: 29.2 pg (ref 26.0–34.0)
MCHC: 33.4 g/dL (ref 30.0–36.0)
MCV: 87.5 fL (ref 78.0–100.0)
PLATELETS: 207 10*3/uL (ref 150–400)
RBC: 4.65 MIL/uL (ref 3.87–5.11)
RDW: 13.9 % (ref 11.5–15.5)
WBC: 6.3 10*3/uL (ref 4.0–10.5)

## 2016-09-03 LAB — SURGICAL PCR SCREEN
MRSA, PCR: NEGATIVE
Staphylococcus aureus: NEGATIVE

## 2016-09-03 NOTE — Progress Notes (Signed)
BMP done 09/03/16 routed to Dr. Tonita Cong via epic

## 2016-09-03 NOTE — Progress Notes (Signed)
LOV Clearance 08/20/16 on chart EKG 08/20/16 epic

## 2016-09-03 NOTE — H&P (Signed)
Jennifer Wilkins is an 83 y.o. female.   Chief Complaint: back and bilateral leg pain HPI: The patient is a 83 year old female who presents today for follow up of their back. The patient is being followed for their back pain. They are now 1 year(s) out from when symptoms began. Symptoms reported today include: pain (BLE). The following medication has been used for pain control: none. The patient reports their current pain level to be 4 / 10. The patient presents today following ESI (@ L5-S1 left).  Jennifer Wilkins follows up, had an epidural, not much help from the epidural. She is having worse pain with activity, worse with standing, better with sitting. Leg is significantly worse than back, predominantly on the right. She does have a history of osteoporosis.  REVIEW OF SYSTEMS Review of systems is negative for fevers, chest pain, shortness of breath, unexplained recent weight loss, loss of bowel or bladder function, burning with urination, joint swelling, rashes, weakness or numbness, difficulty with balance, easy bruising, excessive thirst or frequent urination.  Past Medical History:  Diagnosis Date  . Arthritis   . Elevated BP 04/21/2014  . HTN (hypertension)   . Hypercholesteremia   . Hypocalcemia 06/19/2013  . Low back pain radiating to both legs 08/27/2015  . Medicare annual wellness visit, subsequent 05/03/2015  . Preventative health care 06/02/2016  . Stress reaction 04/25/2015    Past Surgical History:  Procedure Laterality Date  . GALLBLADDER SURGERY  1984   Gallstone removal    Family History  Problem Relation Age of Onset  . Osteoporosis Mother   . Ulcers Father   . Kidney disease Paternal Grandfather    Social History:  reports that she has never smoked. She has never used smokeless tobacco. She reports that she does not drink alcohol. Her drug history is not on file.  Allergies: No Known Allergies   (Not in a hospital admission)  No results found for this or any previous  visit (from the past 48 hour(s)). No results found.  Review of Systems  Constitutional: Negative.   HENT: Negative.   Eyes: Negative.   Respiratory: Negative.   Cardiovascular: Negative.   Gastrointestinal: Negative.   Genitourinary: Negative.   Musculoskeletal: Positive for back pain.  Skin: Negative.   Neurological: Positive for sensory change and focal weakness.    There were no vitals taken for this visit. Physical Exam  Constitutional: She is oriented to person, place, and time. She appears well-developed.  HENT:  Head: Normocephalic.  Eyes: Pupils are equal, round, and reactive to light.  Neck: Normal range of motion.  Cardiovascular: Normal rate.   Respiratory: Effort normal.  GI: Soft.  Musculoskeletal:  Healthy female, moderate distress. Walks with forward flex antalgic gait. Straight leg raise on the right, buttock and thigh pain; on the left negative. Trace EHL weakness on the right compared to the left.  Lumbar spine exam reveals no evidence of soft tissue swelling, deformity or skin ecchymosis. On palpation there is no tenderness of the lumbar spine. No flank pain with percussion. The abdomen is soft and nontender. Nontender over the trochanters. No cellulitis or lymphadenopathy.  Good range of motion of the lumbar spine without associated pain. Motor is 5/5 including tibialis anterior, plantar flexion, quadriceps and hamstrings. Patient is normoreflexic. There is no Babinski or clonus. Sensory exam is intact to light touch. Patient has good distal pulses. No DVT. No pain and normal range of motion without instability of the hips, knees and  ankles.  Neurological: She is alert and oriented to person, place, and time. She has normal reflexes.  Skin: Skin is warm and dry.    Three-view radiographs demonstrated grade I listhesis at 4-5, no instability in flexion and extension.  MRI demonstrates multifactorial severe stenosis at 4-5 due to facet arthrosis and grade I  listhesis.  Assessment/Plan 1.Refractory neurogenic claudication secondary to spinal stenosis due to degenerative listhesis without instability in flexion and extension. 2. Osteoporosis.  We discussed options of living with her symptoms versus lumbar decompression. She has not had any help from physical therapy, activity modification, injections. Discussed decompression at 4-5 with lateral mass fusion, autologous and allograft bone graft. She would like to contemplate her options and call back and proceed accordingly overnight in the hospital. She is otherwise pretty healthy. No instrumentation due to her osteoporosis, age and lack of instability with flexion and extension. We will wait to hear from her.  Plan microlumbar decompression L4-5, lateral mass fusion using autograft bone  BISSELL, Jennifer Wilkins., PA-C for Dr. Tonita Cong 09/03/2016, 8:58 AM

## 2016-09-11 ENCOUNTER — Encounter (HOSPITAL_COMMUNITY): Payer: Self-pay | Admitting: *Deleted

## 2016-09-11 ENCOUNTER — Encounter (HOSPITAL_COMMUNITY)
Admission: RE | Disposition: A | Discharge status: Home / Self Care | Payer: Self-pay | Source: Ambulatory Visit | Attending: Specialist

## 2016-09-11 ENCOUNTER — Telehealth: Payer: Self-pay | Admitting: Family Medicine

## 2016-09-11 ENCOUNTER — Ambulatory Visit (HOSPITAL_COMMUNITY): Payer: Medicare Other | Admitting: Certified Registered Nurse Anesthetist

## 2016-09-11 ENCOUNTER — Ambulatory Visit (HOSPITAL_COMMUNITY): Payer: Medicare Other

## 2016-09-11 ENCOUNTER — Inpatient Hospital Stay (HOSPITAL_COMMUNITY)
Admission: RE | Admit: 2016-09-11 | Discharge: 2016-09-13 | DRG: 460 | Disposition: A | Discharge status: Home / Self Care | Payer: Medicare Other | Source: Ambulatory Visit | Attending: Specialist | Admitting: Specialist

## 2016-09-11 DIAGNOSIS — M48061 Spinal stenosis, lumbar region without neurogenic claudication: Secondary | ICD-10-CM

## 2016-09-11 DIAGNOSIS — M48062 Spinal stenosis, lumbar region with neurogenic claudication: Secondary | ICD-10-CM | POA: Diagnosis not present

## 2016-09-11 DIAGNOSIS — M5416 Radiculopathy, lumbar region: Secondary | ICD-10-CM | POA: Diagnosis not present

## 2016-09-11 DIAGNOSIS — Z9889 Other specified postprocedural states: Secondary | ICD-10-CM | POA: Diagnosis not present

## 2016-09-11 DIAGNOSIS — E7800 Pure hypercholesterolemia, unspecified: Secondary | ICD-10-CM | POA: Diagnosis present

## 2016-09-11 DIAGNOSIS — I10 Essential (primary) hypertension: Secondary | ICD-10-CM | POA: Diagnosis not present

## 2016-09-11 DIAGNOSIS — Z419 Encounter for procedure for purposes other than remedying health state, unspecified: Secondary | ICD-10-CM

## 2016-09-11 DIAGNOSIS — I959 Hypotension, unspecified: Secondary | ICD-10-CM | POA: Diagnosis present

## 2016-09-11 DIAGNOSIS — E785 Hyperlipidemia, unspecified: Secondary | ICD-10-CM | POA: Diagnosis not present

## 2016-09-11 DIAGNOSIS — M4316 Spondylolisthesis, lumbar region: Secondary | ICD-10-CM | POA: Diagnosis present

## 2016-09-11 DIAGNOSIS — M810 Age-related osteoporosis without current pathological fracture: Secondary | ICD-10-CM | POA: Diagnosis not present

## 2016-09-11 DIAGNOSIS — M5136 Other intervertebral disc degeneration, lumbar region: Secondary | ICD-10-CM | POA: Diagnosis not present

## 2016-09-11 DIAGNOSIS — M545 Low back pain: Secondary | ICD-10-CM | POA: Diagnosis not present

## 2016-09-11 HISTORY — PX: LUMBAR LAMINECTOMY/DECOMPRESSION MICRODISCECTOMY: SHX5026

## 2016-09-11 HISTORY — DX: Spinal stenosis, lumbar region without neurogenic claudication: M48.061

## 2016-09-11 SURGERY — LUMBAR LAMINECTOMY/DECOMPRESSION MICRODISCECTOMY 1 LEVEL
Anesthesia: General | Site: Back

## 2016-09-11 MED ORDER — OXYCODONE-ACETAMINOPHEN 5-325 MG PO TABS
1.0000 | ORAL_TABLET | ORAL | Status: DC | PRN
  Administered 2016-09-11 – 2016-09-12 (×2): 2 via ORAL
  Administered 2016-09-12 – 2016-09-13 (×6): 1 via ORAL
  Filled 2016-09-11: qty 1
  Filled 2016-09-11: qty 2
  Filled 2016-09-11: qty 1
  Filled 2016-09-11 (×2): qty 2
  Filled 2016-09-11: qty 1
  Filled 2016-09-11: qty 2
  Filled 2016-09-11 (×2): qty 1

## 2016-09-11 MED ORDER — HYDROCHLOROTHIAZIDE 12.5 MG PO CAPS
12.5000 mg | ORAL_CAPSULE | Freq: Every day | ORAL | Status: DC
  Filled 2016-09-11: qty 1

## 2016-09-11 MED ORDER — DOCUSATE SODIUM 100 MG PO CAPS: 100.0000 mg | ORAL_CAPSULE | Freq: Two times a day (BID) | ORAL | 1 refills | Status: DC | PRN

## 2016-09-11 MED ORDER — PHENOL 1.4 % MT LIQD
1.0000 | OROMUCOSAL | Status: DC | PRN
  Filled 2016-09-11: qty 177

## 2016-09-11 MED ORDER — ROCURONIUM BROMIDE 10 MG/ML (PF) SYRINGE
PREFILLED_SYRINGE | INTRAVENOUS | Status: DC | PRN
  Administered 2016-09-11: 50 mg via INTRAVENOUS

## 2016-09-11 MED ORDER — LACTATED RINGERS IV SOLN
INTRAVENOUS | Status: DC
  Administered 2016-09-11: 1000 mL via INTRAVENOUS
  Administered 2016-09-11: 13:00:00 via INTRAVENOUS

## 2016-09-11 MED ORDER — ACETAMINOPHEN 325 MG PO TABS
650.0000 mg | ORAL_TABLET | ORAL | Status: DC | PRN
  Administered 2016-09-12: 03:00:00 650 mg via ORAL
  Filled 2016-09-11: qty 2

## 2016-09-11 MED ORDER — CARVEDILOL 12.5 MG PO TABS
12.5000 mg | ORAL_TABLET | Freq: Every day | ORAL | Status: DC
  Filled 2016-09-11: qty 1

## 2016-09-11 MED ORDER — DOCUSATE SODIUM 100 MG PO CAPS
100.0000 mg | ORAL_CAPSULE | Freq: Two times a day (BID) | ORAL | Status: DC
  Administered 2016-09-11 – 2016-09-13 (×4): 100 mg via ORAL
  Filled 2016-09-11 (×4): qty 1

## 2016-09-11 MED ORDER — CEFAZOLIN SODIUM-DEXTROSE 2-4 GM/100ML-% IV SOLN
2.0000 g | Freq: Three times a day (TID) | INTRAVENOUS | Status: AC
  Administered 2016-09-11 – 2016-09-12 (×3): 2 g via INTRAVENOUS
  Filled 2016-09-11 (×3): qty 100

## 2016-09-11 MED ORDER — PHENYLEPHRINE 40 MCG/ML (10ML) SYRINGE FOR IV PUSH (FOR BLOOD PRESSURE SUPPORT)
PREFILLED_SYRINGE | INTRAVENOUS | Status: DC | PRN
  Administered 2016-09-11 (×2): 40 ug via INTRAVENOUS
  Administered 2016-09-11 (×4): 80 ug via INTRAVENOUS

## 2016-09-11 MED ORDER — VITAMIN B-12 1000 MCG PO TABS
1500.0000 ug | ORAL_TABLET | Freq: Every day | ORAL | Status: DC
  Administered 2016-09-11 – 2016-09-13 (×3): 1500 ug via ORAL
  Filled 2016-09-11 (×3): qty 2

## 2016-09-11 MED ORDER — FENTANYL CITRATE (PF) 100 MCG/2ML IJ SOLN
INTRAMUSCULAR | Status: AC
  Filled 2016-09-11: qty 2

## 2016-09-11 MED ORDER — ONDANSETRON HCL 4 MG/2ML IJ SOLN
INTRAMUSCULAR | Status: DC | PRN
  Administered 2016-09-11: 4 mg via INTRAVENOUS

## 2016-09-11 MED ORDER — RISAQUAD PO CAPS
1.0000 | ORAL_CAPSULE | Freq: Every day | ORAL | Status: DC
  Administered 2016-09-11 – 2016-09-13 (×3): 1 via ORAL
  Filled 2016-09-11 (×3): qty 1

## 2016-09-11 MED ORDER — ALUM & MAG HYDROXIDE-SIMETH 200-200-20 MG/5ML PO SUSP: 30.0000 mL | Freq: Four times a day (QID) | ORAL | Status: DC | PRN

## 2016-09-11 MED ORDER — HYDROMORPHONE HCL 1 MG/ML IJ SOLN
0.2500 mg | INTRAMUSCULAR | Status: DC | PRN
  Administered 2016-09-11: 0.5 mg via INTRAVENOUS

## 2016-09-11 MED ORDER — ROCURONIUM BROMIDE 50 MG/5ML IV SOSY
PREFILLED_SYRINGE | INTRAVENOUS | Status: AC
  Filled 2016-09-11: qty 5

## 2016-09-11 MED ORDER — BISACODYL 5 MG PO TBEC: 5.0000 mg | DELAYED_RELEASE_TABLET | Freq: Every day | ORAL | Status: DC | PRN

## 2016-09-11 MED ORDER — MAGNESIUM CITRATE PO SOLN: 1.0000 | Freq: Once | ORAL | Status: DC | PRN

## 2016-09-11 MED ORDER — DEXAMETHASONE SODIUM PHOSPHATE 10 MG/ML IJ SOLN
INTRAMUSCULAR | Status: DC | PRN
  Administered 2016-09-11: 8 mg via INTRAVENOUS

## 2016-09-11 MED ORDER — MENTHOL 3 MG MT LOZG: 1.0000 | LOZENGE | OROMUCOSAL | Status: DC | PRN

## 2016-09-11 MED ORDER — ONDANSETRON HCL 4 MG/2ML IJ SOLN
4.0000 mg | INTRAMUSCULAR | Status: DC | PRN
  Administered 2016-09-12 (×2): 4 mg via INTRAVENOUS
  Filled 2016-09-11 (×2): qty 2

## 2016-09-11 MED ORDER — PROPOFOL 10 MG/ML IV BOLUS
INTRAVENOUS | Status: AC
  Filled 2016-09-11: qty 20

## 2016-09-11 MED ORDER — METHOCARBAMOL 1000 MG/10ML IJ SOLN
500.0000 mg | Freq: Four times a day (QID) | INTRAMUSCULAR | Status: DC | PRN
  Filled 2016-09-11: qty 5

## 2016-09-11 MED ORDER — SODIUM CHLORIDE 0.9 % IR SOLN
Status: AC
  Filled 2016-09-11: qty 500000

## 2016-09-11 MED ORDER — THROMBIN 5000 UNITS EX SOLR
CUTANEOUS | Status: AC
  Filled 2016-09-11: qty 10000

## 2016-09-11 MED ORDER — PROPOFOL 10 MG/ML IV BOLUS
INTRAVENOUS | Status: DC | PRN
  Administered 2016-09-11: 130 mg via INTRAVENOUS

## 2016-09-11 MED ORDER — GELATIN ABSORBABLE MT POWD
OROMUCOSAL | Status: DC | PRN
  Administered 2016-09-11: 10 mL via TOPICAL

## 2016-09-11 MED ORDER — HYDROCODONE-ACETAMINOPHEN 5-325 MG PO TABS: 1.0000 | ORAL_TABLET | ORAL | 0 refills | Status: DC | PRN

## 2016-09-11 MED ORDER — LIDOCAINE 2% (20 MG/ML) 5 ML SYRINGE
INTRAMUSCULAR | Status: DC | PRN
  Administered 2016-09-11: 100 mg via INTRAVENOUS

## 2016-09-11 MED ORDER — POLYETHYLENE GLYCOL 3350 17 G PO PACK: 17.0000 g | PACK | Freq: Every day | ORAL | 0 refills | Status: DC

## 2016-09-11 MED ORDER — LIDOCAINE-EPINEPHRINE (PF) 1 %-1:200000 IJ SOLN
INTRAMUSCULAR | Status: AC
  Filled 2016-09-11: qty 30

## 2016-09-11 MED ORDER — LIDOCAINE-EPINEPHRINE (PF) 1 %-1:200000 IJ SOLN
INTRAMUSCULAR | Status: DC | PRN
Start: 2016-09-11 — End: 2016-09-11
  Administered 2016-09-11: 10 mL

## 2016-09-11 MED ORDER — HYDROCODONE-ACETAMINOPHEN 5-325 MG PO TABS
1.0000 | ORAL_TABLET | ORAL | Status: DC | PRN
  Filled 2016-09-11: qty 2

## 2016-09-11 MED ORDER — PHENYLEPHRINE 40 MCG/ML (10ML) SYRINGE FOR IV PUSH (FOR BLOOD PRESSURE SUPPORT)
PREFILLED_SYRINGE | INTRAVENOUS | Status: AC
  Filled 2016-09-11: qty 10

## 2016-09-11 MED ORDER — EPHEDRINE 5 MG/ML INJ
INTRAVENOUS | Status: AC
  Filled 2016-09-11: qty 10

## 2016-09-11 MED ORDER — HYDROMORPHONE HCL 1 MG/ML IJ SOLN
INTRAMUSCULAR | Status: AC
  Filled 2016-09-11: qty 1

## 2016-09-11 MED ORDER — ACETAMINOPHEN 650 MG RE SUPP: 650.0000 mg | RECTAL | Status: DC | PRN

## 2016-09-11 MED ORDER — SUGAMMADEX SODIUM 200 MG/2ML IV SOLN
INTRAVENOUS | Status: AC
  Filled 2016-09-11: qty 2

## 2016-09-11 MED ORDER — KCL IN DEXTROSE-NACL 20-5-0.45 MEQ/L-%-% IV SOLN
INTRAVENOUS | Status: AC
  Administered 2016-09-11: 16:00:00 via INTRAVENOUS
  Filled 2016-09-11 (×2): qty 1000

## 2016-09-11 MED ORDER — HYDROMORPHONE HCL 1 MG/ML IJ SOLN
0.5000 mg | INTRAMUSCULAR | Status: DC | PRN
  Administered 2016-09-11 (×2): 1 mg via INTRAVENOUS
  Filled 2016-09-11 (×2): qty 1

## 2016-09-11 MED ORDER — SUGAMMADEX SODIUM 200 MG/2ML IV SOLN
INTRAVENOUS | Status: DC | PRN
  Administered 2016-09-11: 100 mg via INTRAVENOUS

## 2016-09-11 MED ORDER — FENTANYL CITRATE (PF) 100 MCG/2ML IJ SOLN
INTRAMUSCULAR | Status: DC | PRN
  Administered 2016-09-11 (×2): 25 ug via INTRAVENOUS

## 2016-09-11 MED ORDER — METHOCARBAMOL 500 MG PO TABS
500.0000 mg | ORAL_TABLET | Freq: Four times a day (QID) | ORAL | Status: DC | PRN
  Administered 2016-09-11 – 2016-09-13 (×5): 500 mg via ORAL
  Filled 2016-09-11 (×5): qty 1

## 2016-09-11 MED ORDER — CEFAZOLIN SODIUM-DEXTROSE 2-4 GM/100ML-% IV SOLN
INTRAVENOUS | Status: AC
  Filled 2016-09-11: qty 100

## 2016-09-11 MED ORDER — POLYMYXIN B SULFATE 500000 UNITS IJ SOLR
INTRAMUSCULAR | Status: DC | PRN
  Administered 2016-09-11: 500 mL

## 2016-09-11 MED ORDER — SUGAMMADEX SODIUM 500 MG/5ML IV SOLN
INTRAVENOUS | Status: AC
  Filled 2016-09-11: qty 5

## 2016-09-11 MED ORDER — POLYETHYLENE GLYCOL 3350 17 G PO PACK: 17.0000 g | PACK | Freq: Every day | ORAL | Status: DC | PRN

## 2016-09-11 MED ORDER — CEFAZOLIN SODIUM-DEXTROSE 2-4 GM/100ML-% IV SOLN
2.0000 g | INTRAVENOUS | Status: AC
  Administered 2016-09-11: 2 g via INTRAVENOUS
  Filled 2016-09-11: qty 100

## 2016-09-11 SURGICAL SUPPLY — 49 items
BAG ZIPLOCK 12X15 (MISCELLANEOUS) IMPLANT
CLEANER TIP ELECTROSURG 2X2 (MISCELLANEOUS) ×3 IMPLANT
CLOSURE WOUND 1/2 X4 (GAUZE/BANDAGES/DRESSINGS) ×1
CLOTH 2% CHLOROHEXIDINE 3PK (PERSONAL CARE ITEMS) ×3 IMPLANT
DRAPE MICROSCOPE LEICA (MISCELLANEOUS) ×3 IMPLANT
DRAPE POUCH INSTRU U-SHP 10X18 (DRAPES) ×3 IMPLANT
DRAPE SHEET LG 3/4 BI-LAMINATE (DRAPES) ×3 IMPLANT
DRAPE SURG 17X11 SM STRL (DRAPES) ×3 IMPLANT
DRAPE UTILITY XL STRL (DRAPES) ×3 IMPLANT
DRSG AQUACEL AG ADV 3.5X 4 (GAUZE/BANDAGES/DRESSINGS) IMPLANT
DRSG AQUACEL AG ADV 3.5X 6 (GAUZE/BANDAGES/DRESSINGS) ×3 IMPLANT
DURAPREP 26ML APPLICATOR (WOUND CARE) ×3 IMPLANT
DURASEAL SPINE SEALANT 3ML (MISCELLANEOUS) IMPLANT
ELECT BLADE TIP CTD 4 INCH (ELECTRODE) IMPLANT
ELECT REM PT RETURN 9FT ADLT (ELECTROSURGICAL) ×3
ELECTRODE REM PT RTRN 9FT ADLT (ELECTROSURGICAL) ×1 IMPLANT
GLOVE BIOGEL PI IND STRL 7.0 (GLOVE) ×1 IMPLANT
GLOVE BIOGEL PI INDICATOR 7.0 (GLOVE) ×2
GLOVE SURG SS PI 7.0 STRL IVOR (GLOVE) ×3 IMPLANT
GLOVE SURG SS PI 7.5 STRL IVOR (GLOVE) ×3 IMPLANT
GLOVE SURG SS PI 8.0 STRL IVOR (GLOVE) ×6 IMPLANT
GOWN STRL REUS W/TWL XL LVL3 (GOWN DISPOSABLE) ×6 IMPLANT
GRAFT IC CHAMBER MED (Bone Implant) ×3 IMPLANT
HEMOSTAT SPONGE AVITENE ULTRA (HEMOSTASIS) IMPLANT
IV CATH 14GX2 1/4 (CATHETERS) ×3 IMPLANT
KIT BASIN OR (CUSTOM PROCEDURE TRAY) ×3 IMPLANT
KIT POSITIONING SURG ANDREWS (MISCELLANEOUS) ×3 IMPLANT
MANIFOLD NEPTUNE II (INSTRUMENTS) ×3 IMPLANT
NEEDLE SPNL 18GX3.5 QUINCKE PK (NEEDLE) ×6 IMPLANT
PACK LAMINECTOMY ORTHO (CUSTOM PROCEDURE TRAY) ×3 IMPLANT
PATTIES SURGICAL .5 X.5 (GAUZE/BANDAGES/DRESSINGS) IMPLANT
PATTIES SURGICAL .75X.75 (GAUZE/BANDAGES/DRESSINGS) IMPLANT
PATTIES SURGICAL 1X1 (DISPOSABLE) IMPLANT
RUBBERBAND STERILE (MISCELLANEOUS) ×6 IMPLANT
SPONGE SURGIFOAM ABS GEL 100 (HEMOSTASIS) ×3 IMPLANT
STAPLER VISISTAT (STAPLE) IMPLANT
STRIP CLOSURE SKIN 1/2X4 (GAUZE/BANDAGES/DRESSINGS) ×2 IMPLANT
SUT NURALON 4 0 TR CR/8 (SUTURE) IMPLANT
SUT PROLENE 3 0 PS 2 (SUTURE) ×3 IMPLANT
SUT VIC AB 1 CT1 27 (SUTURE)
SUT VIC AB 1 CT1 27XBRD ANTBC (SUTURE) IMPLANT
SUT VIC AB 1-0 CT2 27 (SUTURE) ×3 IMPLANT
SUT VIC AB 2-0 CT1 27 (SUTURE)
SUT VIC AB 2-0 CT1 TAPERPNT 27 (SUTURE) IMPLANT
SUT VIC AB 2-0 CT2 27 (SUTURE) ×3 IMPLANT
SYR 3ML LL SCALE MARK (SYRINGE) IMPLANT
TOWEL OR 17X26 10 PK STRL BLUE (TOWEL DISPOSABLE) ×3 IMPLANT
TOWEL OR NON WOVEN STRL DISP B (DISPOSABLE) IMPLANT
YANKAUER SUCT BULB TIP NO VENT (SUCTIONS) IMPLANT

## 2016-09-11 NOTE — Anesthesia Postprocedure Evaluation (Signed)
Anesthesia Post Note  Patient: Jennifer Wilkins  Procedure(s) Performed: Procedure(s) (LRB): Microlumbar Decompression L3-4 and L4-5, lateral mass fusion at L4-5 using autologous, autograft bone (N/A)  Patient location during evaluation: PACU Anesthesia Type: General Level of consciousness: awake Pain management: pain level controlled Respiratory status: spontaneous breathing Cardiovascular status: stable Anesthetic complications: no       Last Vitals:  Vitals:   09/11/16 1330 09/11/16 1345  BP: 137/77 131/64  Pulse: 62 (!) 58  Resp: 13 15  Temp:      Last Pain:  Vitals:   09/11/16 1345  TempSrc:   PainSc: Asleep                 Dakwon Wenberg

## 2016-09-11 NOTE — H&P (View-Only) (Signed)
Jennifer Wilkins is an 83 y.o. female.   Chief Complaint: back and bilateral leg pain HPI: The patient is a 83 year old female who presents today for follow up of their back. The patient is being followed for their back pain. They are now 1 year(s) out from when symptoms began. Symptoms reported today include: pain (BLE). The following medication has been used for pain control: none. The patient reports their current pain level to be 4 / 10. The patient presents today following ESI (@ L5-S1 left).  Jennifer Wilkins follows up, had an epidural, not much help from the epidural. She is having worse pain with activity, worse with standing, better with sitting. Leg is significantly worse than back, predominantly on the right. She does have a history of osteoporosis.  REVIEW OF SYSTEMS Review of systems is negative for fevers, chest pain, shortness of breath, unexplained recent weight loss, loss of bowel or bladder function, burning with urination, joint swelling, rashes, weakness or numbness, difficulty with balance, easy bruising, excessive thirst or frequent urination.  Past Medical History:  Diagnosis Date  . Arthritis   . Elevated BP 04/21/2014  . HTN (hypertension)   . Hypercholesteremia   . Hypocalcemia 06/19/2013  . Low back pain radiating to both legs 08/27/2015  . Medicare annual wellness visit, subsequent 05/03/2015  . Preventative health care 06/02/2016  . Stress reaction 04/25/2015    Past Surgical History:  Procedure Laterality Date  . GALLBLADDER SURGERY  1984   Gallstone removal    Family History  Problem Relation Age of Onset  . Osteoporosis Mother   . Ulcers Father   . Kidney disease Paternal Grandfather    Social History:  reports that she has never smoked. She has never used smokeless tobacco. She reports that she does not drink alcohol. Her drug history is not on file.  Allergies: No Known Allergies   (Not in a hospital admission)  No results found for this or any previous  visit (from the past 48 hour(s)). No results found.  Review of Systems  Constitutional: Negative.   HENT: Negative.   Eyes: Negative.   Respiratory: Negative.   Cardiovascular: Negative.   Gastrointestinal: Negative.   Genitourinary: Negative.   Musculoskeletal: Positive for back pain.  Skin: Negative.   Neurological: Positive for sensory change and focal weakness.    There were no vitals taken for this visit. Physical Exam  Constitutional: She is oriented to person, place, and time. She appears well-developed.  HENT:  Head: Normocephalic.  Eyes: Pupils are equal, round, and reactive to light.  Neck: Normal range of motion.  Cardiovascular: Normal rate.   Respiratory: Effort normal.  GI: Soft.  Musculoskeletal:  Healthy female, moderate distress. Walks with forward flex antalgic gait. Straight leg raise on the right, buttock and thigh pain; on the left negative. Trace EHL weakness on the right compared to the left.  Lumbar spine exam reveals no evidence of soft tissue swelling, deformity or skin ecchymosis. On palpation there is no tenderness of the lumbar spine. No flank pain with percussion. The abdomen is soft and nontender. Nontender over the trochanters. No cellulitis or lymphadenopathy.  Good range of motion of the lumbar spine without associated pain. Motor is 5/5 including tibialis anterior, plantar flexion, quadriceps and hamstrings. Patient is normoreflexic. There is no Babinski or clonus. Sensory exam is intact to light touch. Patient has good distal pulses. No DVT. No pain and normal range of motion without instability of the hips, knees and  ankles.  Neurological: She is alert and oriented to person, place, and time. She has normal reflexes.  Skin: Skin is warm and dry.    Three-view radiographs demonstrated grade I listhesis at 4-5, no instability in flexion and extension.  MRI demonstrates multifactorial severe stenosis at 4-5 due to facet arthrosis and grade I  listhesis.  Assessment/Plan 1.Refractory neurogenic claudication secondary to spinal stenosis due to degenerative listhesis without instability in flexion and extension. 2. Osteoporosis.  We discussed options of living with her symptoms versus lumbar decompression. She has not had any help from physical therapy, activity modification, injections. Discussed decompression at 4-5 with lateral mass fusion, autologous and allograft bone graft. She would like to contemplate her options and call back and proceed accordingly overnight in the hospital. She is otherwise pretty healthy. No instrumentation due to her osteoporosis, age and lack of instability with flexion and extension. We will wait to hear from her.  Plan microlumbar decompression L4-5, lateral mass fusion using autograft bone  Jennifer Wilkins, [name], PA-C for Dr. Tonita Cong 09/03/2016, 8:58 AM

## 2016-09-11 NOTE — Anesthesia Procedure Notes (Signed)
Procedure Name: Intubation Date/Time: 09/11/2016 11:12 AM Performed by: Caroll Weinheimer, Virgel Gess Pre-anesthesia Checklist: Patient identified, Emergency Drugs available, Suction available, Patient being monitored and Timeout performed Patient Re-evaluated:Patient Re-evaluated prior to inductionOxygen Delivery Method: Circle system utilized Preoxygenation: Pre-oxygenation with 100% oxygen Intubation Type: IV induction Ventilation: Mask ventilation without difficulty Laryngoscope Size: Miller and 2 Grade View: Grade I Tube type: Oral Tube size: 7.5 mm Airway Equipment and Method: Stylet Placement Confirmation: ETT inserted through vocal cords under direct vision,  positive ETCO2 and breath sounds checked- equal and bilateral Secured at: 21 cm Tube secured with: Tape Dental Injury: Teeth and Oropharynx as per pre-operative assessment  Comments: Intubated by Jackquline Bosch SRNA

## 2016-09-11 NOTE — Anesthesia Preprocedure Evaluation (Signed)
Anesthesia Evaluation  Patient identified by MRN, date of birth, ID band Patient awake    Reviewed: Allergy & Precautions, NPO status , Patient's Chart, lab work & pertinent test results  Airway Mallampati: II  TM Distance: >3 FB     Dental   Pulmonary neg pulmonary ROS,    breath sounds clear to auscultation       Cardiovascular hypertension,  Rhythm:Regular Rate:Normal     Neuro/Psych negative neurological ROS     GI/Hepatic negative GI ROS, Neg liver ROS,   Endo/Other  negative endocrine ROS  Renal/GU negative Renal ROS     Musculoskeletal  (+) Arthritis ,   Abdominal   Peds  Hematology   Anesthesia Other Findings   Reproductive/Obstetrics                             Anesthesia Physical Anesthesia Plan  ASA: III  Anesthesia Plan: General   Post-op Pain Management:    Induction: Intravenous  Airway Management Planned: Oral ETT  Additional Equipment:   Intra-op Plan:   Post-operative Plan: Extubation in OR  Informed Consent: I have reviewed the patients History and Physical, chart, labs and discussed the procedure including the risks, benefits and alternatives for the proposed anesthesia with the patient or authorized representative who has indicated his/her understanding and acceptance.   Dental advisory given  Plan Discussed with: CRNA and Anesthesiologist  Anesthesia Plan Comments:         Anesthesia Quick Evaluation

## 2016-09-11 NOTE — Interval H&P Note (Signed)
History and Physical Interval Note:  09/11/2016 10:27 AM  Jennifer Wilkins  has presented today for surgery, with the diagnosis of Stenosis L4-5  The various methods of treatment have been discussed with the patient and family. After consideration of risks, benefits and other options for treatment, the patient has consented to  Procedure(s): Microlumbar Decompression L4-5 lateral mass fusion using autologous, autograft bone (N/A) as a surgical intervention .  The patient's history has been reviewed, patient examined, no change in status, stable for surgery.  I have reviewed the patient's chart and labs.  Questions were answered to the patient's satisfaction.     Jerimah Witucki C

## 2016-09-11 NOTE — Transfer of Care (Signed)
Immediate Anesthesia Transfer of Care Note  Patient: Jennifer Wilkins  Procedure(s) Performed: Procedure(s): Microlumbar Decompression L3-4 and L4-5, lateral mass fusion at L4-5 using autologous, autograft bone (N/A)  Patient Location: PACU  Anesthesia Type:General  Level of Consciousness:  sedated, patient cooperative and responds to stimulation  Airway & Oxygen Therapy:Patient Spontanous Breathing and Patient connected to face mask oxgen  Post-op Assessment:  Report given to PACU RN and Post -op Vital signs reviewed and stable  Post vital signs:  Reviewed and stable  Last Vitals:  Vitals:   09/11/16 0831  BP: 129/88  Pulse: 79  Resp: 16  Temp: [id number]    Complications: No apparent anesthesia complications

## 2016-09-11 NOTE — Discharge Instructions (Signed)

## 2016-09-11 NOTE — Interval H&P Note (Signed)
History and Physical Interval Note:  09/11/2016 10:27 AM  Jennifer Wilkins  has presented today for surgery, with the diagnosis of Stenosis L4-5  The various methods of treatment have been discussed with the patient and family. After consideration of risks, benefits and other options for treatment, the patient has consented to  Procedure(s): Microlumbar Decompression L4-5 lateral mass fusion using autologous, autograft bone (N/A) as a surgical intervention .  The patient's history has been reviewed, patient examined, no change in status, stable for surgery.  I have reviewed the patient's chart and labs.  Questions were answered to the patient's satisfaction.     Nikoletta Varma C

## 2016-09-11 NOTE — Telephone Encounter (Signed)
04/25/15 PR PPPS, SUBSEQ VISIT [id number] lvm advising to call office to scheduled AWV.

## 2016-09-11 NOTE — Brief Op Note (Signed)
09/11/2016  1:02 PM  PATIENT:  Jennifer Wilkins  83 y.o. female  PRE-OPERATIVE DIAGNOSIS:  Stenosis L4-5  POST-OPERATIVE DIAGNOSIS:  Stenosis L4-5  PROCEDURE:  Procedure(s): Microlumbar Decompression L3-4 and L4-5, lateral mass fusion at L4-5 using autologous, autograft bone (N/A)  SURGEON:  Surgeon(s) and Role:    * Susa Day, MD - Primary  PHYSICIAN ASSISTANT:   ASSISTANTS: Bissell   ANESTHESIA:   general  EBL:  Total I/O In: 1000 [I.V.:1000] Out: 380 [Urine:100; Blood:280]  BLOOD ADMINISTERED:none  DRAINS: Penrose drain in the none   LOCAL MEDICATIONS USED:  LIDOCAINE   SPECIMEN:  No Specimen  DISPOSITION OF SPECIMEN:  N/A  COUNTS:  YES  TOURNIQUET:  * No tourniquets in log *  DICTATION: .Other Dictation: Dictation Number [id number]  PLAN OF CARE: Admit for overnight observation  PATIENT DISPOSITION:  PACU - hemodynamically stable.   Delay start of Pharmacological VTE agent (>24hrs) due to surgical blood loss or risk of bleeding: yes

## 2016-09-12 ENCOUNTER — Encounter (HOSPITAL_COMMUNITY): Payer: Self-pay | Admitting: Specialist

## 2016-09-12 DIAGNOSIS — E7800 Pure hypercholesterolemia, unspecified: Secondary | ICD-10-CM | POA: Diagnosis present

## 2016-09-12 DIAGNOSIS — M4316 Spondylolisthesis, lumbar region: Secondary | ICD-10-CM | POA: Diagnosis present

## 2016-09-12 DIAGNOSIS — M48062 Spinal stenosis, lumbar region with neurogenic claudication: Secondary | ICD-10-CM | POA: Diagnosis present

## 2016-09-12 DIAGNOSIS — M5416 Radiculopathy, lumbar region: Secondary | ICD-10-CM | POA: Diagnosis present

## 2016-09-12 DIAGNOSIS — I10 Essential (primary) hypertension: Secondary | ICD-10-CM | POA: Diagnosis present

## 2016-09-12 DIAGNOSIS — I959 Hypotension, unspecified: Secondary | ICD-10-CM | POA: Diagnosis present

## 2016-09-12 DIAGNOSIS — M810 Age-related osteoporosis without current pathological fracture: Secondary | ICD-10-CM | POA: Diagnosis present

## 2016-09-12 LAB — CBC
HCT: 33.5 % — ABNORMAL LOW (ref 36.0–46.0)
Hemoglobin: 11.2 g/dL — ABNORMAL LOW (ref 12.0–15.0)
MCH: 29.2 pg (ref 26.0–34.0)
MCHC: 33.4 g/dL (ref 30.0–36.0)
MCV: 87.5 fL (ref 78.0–100.0)
PLATELETS: 161 10*3/uL (ref 150–400)
RBC: 3.83 MIL/uL — ABNORMAL LOW (ref 3.87–5.11)
RDW: 14 % (ref 11.5–15.5)
WBC: 9.4 10*3/uL (ref 4.0–10.5)

## 2016-09-12 LAB — BASIC METABOLIC PANEL
Anion gap: 7 (ref 5–15)
BUN: 19 mg/dL (ref 6–20)
CHLORIDE: 106 mmol/L (ref 101–111)
CO2: 26 mmol/L (ref 22–32)
CREATININE: 0.79 mg/dL (ref 0.44–1.00)
Calcium: 7.9 mg/dL — ABNORMAL LOW (ref 8.9–10.3)
GFR calc Af Amer: >60 mL/min (ref >60)
GFR calc non Af Amer: >60 mL/min (ref >60)
Glucose, Bld: 186 mg/dL — ABNORMAL HIGH (ref 65–99)
Potassium: 3.9 mmol/L (ref 3.5–5.1)
SODIUM: 139 mmol/L (ref 135–145)

## 2016-09-12 MED ORDER — TURMERIC 500 MG PO CAPS: 1.0000 | ORAL_CAPSULE | Freq: Every day | ORAL | Status: AC

## 2016-09-12 MED ORDER — HAIR/SKIN/NAILS/BIOTIN PO TABS: 1.0000 | ORAL_TABLET | Freq: Every day | ORAL | Status: DC

## 2016-09-12 MED ORDER — MULTI-VITAMIN/MINERALS PO TABS: 1.0000 | ORAL_TABLET | Freq: Every day | ORAL | Status: AC

## 2016-09-12 MED ORDER — SODIUM CHLORIDE 0.9 % IV BOLUS (SEPSIS)
250.0000 mL | Freq: Once | INTRAVENOUS | Status: AC
  Administered 2016-09-12: 250 mL via INTRAVENOUS

## 2016-09-12 MED ORDER — OSTEO BI-FLEX ADV JOINT SHIELD PO TABS: 1.0000 | ORAL_TABLET | Freq: Every day | ORAL | Status: AC

## 2016-09-12 MED ORDER — ASPIRIN 81 MG PO TABS: 81.0000 mg | ORAL_TABLET | Freq: Every day | ORAL | Status: AC

## 2016-09-12 MED ORDER — VITAMIN E 180 MG (400 UNIT) PO CAPS: 400.0000 [IU] | ORAL_CAPSULE | Freq: Every day | ORAL | Status: AC

## 2016-09-12 NOTE — Evaluation (Signed)
Occupational Therapy Evaluation Patient Details Name: QUINTELLA BROZOWSKI MRN: [id number] DOB: [birth year] Today's Date: 09/12/2016    History of Present Illness Pt s/p L 3-4 L4-5 lumbar decompression and L4-5 lateral mass fusion   Clinical Impression   Patient is s/p back surgery resulting in the deficits listed below (see OT Problem List).  Patient will benefit from skilled OT to increase their safety and independence with ADL and functional mobility for ADL (while adhering to their precautions) to facilitate discharge to venue listed below.      Follow Up Recommendations  Home health OT    Equipment Recommendations  None recommended by OT       Precautions / Restrictions Precautions Precautions: Back;Fall Precaution Comments: Per PA, Dr Tonita Cong may order brace but at this time OK to mobilize without Restrictions Weight Bearing Restrictions: No      Mobility Bed Mobility Overal bed mobility: Needs Assistance Bed Mobility: Rolling;Sidelying to Sit Rolling: Min assist Sidelying to sit: Min assist       General bed mobility comments: pt in chair  Transfers Overall transfer level: Needs assistance Equipment used: Rolling walker (2 wheeled) Transfers: Sit to/from Stand Sit to Stand: Min assist         General transfer comment: pts BP 86/51.  Pt wanted to stay in chair. OT put head back an legs up- RN aware    Balance Overall balance assessment: No apparent balance deficits (not formally assessed)                                          ADL Overall ADL's : Needs assistance/impaired Eating/Feeding: Set up;Sitting   Grooming: Set up;Sitting   Upper Body Bathing: Set up;Sitting   Lower Body Bathing: Moderate assistance;Sit to/from stand;Cueing for safety;Cueing for sequencing;Cueing for compensatory techniques;Cueing for back precautions   Upper Body Dressing : Set up;Sitting   Lower Body Dressing: Moderate assistance;Cueing for safety;Cueing for  sequencing;Cueing for compensatory techniques;Adhering to back precautions                 General ADL Comments: pt with low BP- RN aware               Pertinent Vitals/Pain Pain Assessment: 0-10 Pain Score: 4  Pain Location: Back Pain Descriptors / Indicators: Aching;Sore Pain Intervention(s): Monitored during session     Hand Dominance     Extremity/Trunk Assessment Upper Extremity Assessment Upper Extremity Assessment: Overall WFL for tasks assessed   Lower Extremity Assessment Lower Extremity Assessment: Overall WFL for tasks assessed       Communication Communication Communication: No difficulties   Cognition Arousal/Alertness: Awake/alert Behavior During Therapy: WFL for tasks assessed/performed Overall Cognitive Status: Within Functional Limits for tasks assessed                                Home Living Family/patient expects to be discharged to:: Other (Comment) Living Arrangements: Alone                               Additional Comments: Pt in IND living facility and can arrange initial 24/7 in apt per PA      Prior Functioning/Environment Level of Independence: Independent        Comments: Pt states "I was functioning"  OT Problem List: Decreased strength;Decreased activity tolerance;Pain;Decreased safety awareness   OT Treatment/Interventions: Self-care/ADL training;DME and/or AE instruction;Patient/family education    OT Goals(Current goals can be found in the care plan section) Acute Rehab OT Goals Patient Stated Goal: Function with less pain OT Goal Formulation: With patient Time For Goal Achievement: 09/19/16 Potential to Achieve Goals: Good  OT Frequency: Min 2X/week              End of Session Equipment Utilized During Treatment: Rolling walker Nurse Communication: Mobility status  Activity Tolerance: Patient tolerated treatment well Patient left: in chair;with call bell/phone within  reach;with family/visitor present   Time: [id number] OT Time Calculation (min): 18 min Charges:  OT General Charges $OT Visit: 1 Procedure OT Evaluation $OT Eval Moderate Complexity: 1 Procedure G-Codes: OT G-codes **NOT FOR INPATIENT CLASS** Functional Assessment Tool Used: clinical observation Functional Limitation: Self care Self Care Current Status [id number]): At least 40 percent but less than 60 percent impaired, limited or restricted Self Care Goal Status [id number]): At least 1 percent but less than 20 percent impaired, limited or restricted  [place], Thereasa Parkin 09/12/2016, 10:34 AM

## 2016-09-12 NOTE — Care Management Note (Signed)
Case Management Note  Patient Details  Name: JELISA AUKER MRN: [id number] Date of Birth: Oct 06, 1931  Subjective/Objective:                  L 3-4 L4-5 lumbar decompression and L4-5 lateral mass fusion Action/Plan: Discharge planning Expected Discharge Date:  09/12/16               Expected Discharge Plan:  Home/Self Care  In-House Referral:     Discharge planning Services  CM Consult, NA  Post Acute Care Choice:    Choice offered to:  NA  DME Arranged:    DME Agency:  NA  HH Arranged:  NA HH Agency:  NA  Status of Service:  Completed, signed off  If discussed at [place] of Stay Meetings, dates discussed:    Additional Comments: CM notes no HHPT recc or ordered nor was any DME recc or orderd. Pt confirms she will go home Self Care. No other CM needs were communicated. Dellie Catholic, RN 09/12/2016, 11:34 AM

## 2016-09-12 NOTE — Op Note (Signed)
NAMEAMNEH, Jennifer NO.:  [id number]  MEDICAL RECORD NO.:  [id number]  LOCATION:                                 FACILITY:  PHYSICIAN:  Susa Day, M.D.         DATE OF BIRTH:  DATE OF PROCEDURE:  09/11/2016 DATE OF DISCHARGE:                              OPERATIVE REPORT   PREOPERATIVE DIAGNOSIS:  Spinal listhesis, 4-5.  Spinal stenosis, 4-5, 3- 4.  POSTOPERATIVE DIAGNOSIS:  Spinal listhesis, 4-5.  Spinal stenosis, 4-5, 3-4.  PROCEDURES PERFORMED: 1. Microlumbar decompression, L4-5, L3-4. 2. Gill laminectomy of 4. 3. Foraminotomies, L4-L5 bilaterally. 4. Lateral mass fusion utilizing autologous and allograft bone graft.  ANESTHESIA:  General.  ASSISTANT:  Cleophas Dunker, PA.  HISTORY:  An 83 year old with severe bilateral lower extremity radicular pain, neurogenic claudication secondary to severe spinal stenosis, 4-5. She had a grade 1 listhesis with facet hypertrophy.  She did not move in flexion extension, had no back pain, predominantly leg pain.  We indicated for decompression, 4-5 and at 3-4 with removal of the neural arch and lateral mass fusion to prevent further inadvertent listhesis. Risks and benefits were discussed including bleeding, infection, damage to the neurovascular structures, no change in symptoms or worsening symptoms, DVT, PE, anesthetic complications, etc.  TECHNIQUE:  With the patient in supine position, after induction of adequate general anesthesia, 2 g of Kefzol, placed prone on the [place] frame.  All bony prominences were well padded and lumbar region was prepped and draped in usual sterile fashion.  Two 18-gauge spinal needles were utilized to localize the 4-5 interspace, confirmed with x- ray.  Incision was made above the spinous process of 4 to below 5. Subcutaneous tissue was dissected.  Electrocautery was utilized to achieve hemostasis.  Dorsolumbar fascia divided in line with skin incision.  Paraspinous  muscle elevated from lamina of 3-4 and 4-5 bilaterally.  Retractor was placed.  We confirmed the spinous process of 4 and 5.  We skeletonized the spinous process of 4 and 5 and then, we removed them with Leksell rongeur, morselizing those for subsequent allograft.  Operating microscope draped and forwarded on the surgical field.  Proceeded centrally with a hemilaminotomies of 4 bilaterally and centrally.  We placed a neuro patty beneath the ligamentum flavum, severe stenosis was noted here.  We performed the full resection of lamina of 4 and the Gill laminectomy was performed.  She had severe L4 and L5 stenosis.  We performed bilateral hemilaminotomies of 5 decompressing the lateral recesses to the medial border of the pedicle, performing foraminotomies bilaterally, bipolar electrocautery as well as bone wax.  Thrombin-soaked Gelfoam was utilized to achieve hemostasis. Following full decompression bilaterally, both nerve roots with good restoration of thecal sac and neural probe passed freely up the foramen of 4 bilaterally, 5 bilaterally and above the pedicle of 3.  Ligamentum flavum removed from the inferior interspace of 3-4 as well.  Next, we copiously irrigated the wound.  Confirmatory radiographs obtained with a Woodson at the foramen of 4 and 5.  Next, we copiously irrigated the wound, no evidence of CSF leakage or active bleeding.  Placed thrombin-  soaked Gelfoam over the laminotomy defect.  We then utilized cancellous bone chips allograft and mixed them with our autograft and blood from the surgery.  We then proceeded with lateral mass fusion.  I decorticated the lateral aspect of the lamina of the pars laterally and the lateral aspect of the facets and the transverse processes of 4 and 5.  I then utilized the bone graft mixture and deposited into the lateral gutter, digitally impacting that.  We did this bilaterally after identifying the transverse processes in the lateral aspect  of the facets, pars and the ala.  Following this, I removed the McCullough retractor, irrigated the muscles, no evidence of active bleeding.  We then closed the dorsolumbar fascia with #1 Vicryl interrupted figure-of- eight sutures, subcu with 2-0 and skin with staples.  Wound was dressed sterilely, placed supine on the hospital bed, extubated without difficulty and transported to the recovery room in satisfactory condition.  The patient tolerated the procedure well.  No complications.  Assistant, Cleophas Dunker, PA was used throughout the case for patient positioning, gentle suction, closure.  Blood loss was 50 mL.     Susa Day, M.D.   ______________________________ Susa Day, M.D.    Geralynn Rile  D:  09/11/2016  T:  09/11/2016  Job:  [id number]

## 2016-09-12 NOTE — Evaluation (Signed)
Physical Therapy Evaluation Patient Details Name: Jennifer Wilkins MRN: [id number] DOB: 1932-03-11 Today's Date: 09/12/2016   History of Present Illness  Pt s/p L 3-4 L4-5 lumbar decompression and L4-5 lateral mass fusion  Clinical Impression  Pt s/p back surgery and presents with post op pain and back precautions limiting functional mobility.  Pt should progress to return to IND living with initial 24/7 assist of family.    Follow Up Recommendations No PT follow up    Equipment Recommendations  None recommended by PT    Recommendations for Other Services OT consult     Precautions / Restrictions Precautions Precautions: Back;Fall Precaution Comments: Per PA, Dr Tonita Cong may order brace but at this time OK to mobilize without Restrictions Weight Bearing Restrictions: No      Mobility  Bed Mobility Overal bed mobility: Needs Assistance Bed Mobility: Rolling;Sidelying to Sit Rolling: Min assist Sidelying to sit: Min assist       General bed mobility comments: cues for proper log roll technique  Transfers Overall transfer level: Needs assistance Equipment used: Rolling walker (2 wheeled) Transfers: Sit to/from Stand Sit to Stand: Min assist         General transfer comment: cues for transition position, adherence to back precautions and use of UEs to self assist  Ambulation/Gait Ambulation/Gait assistance: Min assist;Min guard Ambulation Distance (Feet): 200 Feet Assistive device: Rolling walker (2 wheeled) Gait Pattern/deviations: Step-to pattern;Step-through pattern;Decreased step length - right;Decreased step length - left;Shuffle;Trunk flexed Gait velocity: decr Gait velocity interpretation: Below normal speed for age/gender General Gait Details: cues for posture and position from [employer]            Wheelchair Mobility    Modified Rankin (Stroke Patients Only)       Balance Overall balance assessment: No apparent balance deficits (not formally  assessed)                                           Pertinent Vitals/Pain Pain Assessment: 0-10 Pain Score: 5  Pain Location: Back Pain Descriptors / Indicators: Aching;Sore Pain Intervention(s): Limited activity within patient's tolerance;Monitored during session;Premedicated before session    Home Living Family/patient expects to be discharged to:: Other (Comment) Living Arrangements: Alone               Additional Comments: Pt in IND living facility and can arrange initial 24/7 in apt per PA    Prior Function Level of Independence: Independent         Comments: Pt states "I was functioning"     Hand Dominance        Extremity/Trunk Assessment   Upper Extremity Assessment Upper Extremity Assessment: Overall WFL for tasks assessed    Lower Extremity Assessment Lower Extremity Assessment: Overall WFL for tasks assessed       Communication   Communication: No difficulties  Cognition Arousal/Alertness: Awake/alert Behavior During Therapy: WFL for tasks assessed/performed Overall Cognitive Status: Within Functional Limits for tasks assessed                      General Comments      Exercises     Assessment/Plan    PT Assessment Patient needs continued PT services  PT Problem List Decreased activity tolerance;Decreased mobility;Decreased knowledge of use of DME;Decreased knowledge of precautions;Pain          PT Treatment  Interventions DME instruction;Gait training;Functional mobility training;Therapeutic activities;Patient/family education    PT Goals (Current goals can be found in the Care Plan section)  Acute Rehab PT Goals Patient Stated Goal: Function with less pain PT Goal Formulation: With patient Time For Goal Achievement: 09/14/16 Potential to Achieve Goals: Good    Frequency 7X/week   Barriers to discharge        Co-evaluation               End of Session   Activity Tolerance: Patient  tolerated treatment well Patient left: in chair;with call bell/phone within reach;with nursing/sitter in room;with family/visitor present Nurse Communication: Mobility status    Functional Assessment Tool Used: Clinical judgement Functional Limitation: Mobility: Walking and moving around Mobility: Walking and Moving Around Goal Status [phone]): At least 20 percent but less than 40 percent impaired, limited or restricted Mobility: Walking and Moving Around Discharge Status [phone]): At least 1 percent but less than 20 percent impaired, limited or restricted    Time: 0850-0915 PT Time Calculation (min) (ACUTE ONLY): 25 min   Charges:   PT Evaluation $PT Eval Low Complexity: 1 Procedure PT Treatments $Gait Training: 8-22 mins   PT G Codes:   PT G-Codes **NOT FOR INPATIENT CLASS** Functional Assessment Tool Used: Clinical judgement Functional Limitation: Mobility: Walking and moving around Mobility: Walking and Moving Around Goal Status [phone]): At least 20 percent but less than 40 percent impaired, limited or restricted Mobility: Walking and Moving Around Discharge Status [phone]): At least 1 percent but less than 20 percent impaired, limited or restricted    [facility] 09/12/2016, 9:46 AM

## 2016-09-12 NOTE — Progress Notes (Signed)
Subjective: 1 Day Post-Op Procedure(s) (LRB): Microlumbar Decompression L3-4 and L4-5, lateral mass fusion at L4-5 using autologous, autograft bone (N/A) Patient reports pain as moderate.  Reports incisional back pain. Dilaudid made her hypotensive overnight. Reports leg pain improved. Foley removed this AM has not voided yet (approx 2 h ago). No other c/o.  Objective: Vital signs in last 24 hours: Temp:  [96.9 F (36.1 C)-98.5 F (36.9 C)] 98.1 F (36.7 C) (02/15 0629) Pulse Rate:  [58-81] 81 (02/15 0629) Resp:  [10-15] 15 (02/15 0629) BP: (98-137)/(60-78) 103/63 (02/15 0629) SpO2:  [100 %] 100 % (02/15 0629)  Intake/Output from previous day: 02/14 0701 - 02/15 0700 In: 2487.7 [P.O.:240; I.V.:2160.7; IV Piggyback:87] Out: 2802 [Urine:2522; Blood:280] Intake/Output this shift: No intake/output data recorded.   Recent Labs  09/12/16 0413  HGB 11.2*    Recent Labs  09/12/16 0413  WBC 9.4  RBC 3.83*  HCT 33.5*  PLT 161    Recent Labs  09/12/16 0413  NA 139  K 3.9  CL 106  CO2 26  BUN 19  CREATININE 0.79  GLUCOSE 186*  CALCIUM 7.9*   No results for input(s): LABPT, INR in the last 72 hours.  Neurologically intact ABD soft Neurovascular intact Sensation intact distally Intact pulses distally Dorsiflexion/Plantar flexion intact Incision: dressing C/D/I and no drainage No cellulitis present Compartment soft no calf pain or sign of DVT  Assessment/Plan: 1 Day Post-Op Procedure(s) (LRB): Microlumbar Decompression L3-4 and L4-5, lateral mass fusion at L4-5 using autologous, autograft bone (N/A) Advance diet Up with therapy D/C IV fluids  Discussed dressing, D/C instructions, Lspine precautions Voiding trial Up with PT today Lumbar corset ordered If doing well with PT, voiding without difficulty, and pain well controlled anticipate D/C later today Discussed with Dr. Mliss Fritz, JACLYN M. 09/12/2016, 9:02 AM

## 2016-09-12 NOTE — Care Management Obs Status (Signed)
MEDICARE OBSERVATION STATUS NOTIFICATION   Patient Details  Name: Jennifer Wilkins MRN: [id number] Date of Birth: 1931/10/10   Medicare Observation Status Notification Given:  Yes    Dellie Catholic, RN 09/12/2016, 1:18 PM

## 2016-09-13 MED ORDER — OXYCODONE-ACETAMINOPHEN 5-325 MG PO TABS: 1.0000 | ORAL_TABLET | Freq: Four times a day (QID) | ORAL | 0 refills | Status: DC | PRN

## 2016-09-13 NOTE — Progress Notes (Signed)
CSW consulted for possible SNF placement. Discussed case with PA this am. Pt is ambulating 200 ft min guard. PT is not recommending SNF placement. If pt / family are interested in  private pay SNF placement, CSW will assist.   Werner Lean LCSW [phone]

## 2016-09-13 NOTE — Progress Notes (Signed)
Physical Therapy Treatment Patient Details Name: Jennifer Wilkins MRN: [id number] DOB: [birth year] Today's Date: 09/13/2016    History of Present Illness Pt s/p L 3-4 L4-5 lumbar decompression and L4-5 lateral mass fusion    PT Comments    Pt in "alot" of back pain.  Assisted OOB to amb a limited distance then back to bed.  Positioned on L side and applied ICE.    Follow Up Recommendations  No PT follow up     Equipment Recommendations  None recommended by PT    Recommendations for Other Services       Precautions / Restrictions Precautions Precautions: Back;Fall Precaution Comments: pt was issued a corsett, back precautions Restrictions Weight Bearing Restrictions: No    Mobility  Bed Mobility Overal bed mobility: Needs Assistance Bed Mobility: Supine to Sit;Sit to Supine Rolling: Mod assist ("log roll") Sidelying to sit: Mod assist;Max assist     Sit to sidelying: Mod assist;Max assist General bed mobility comments: 75% VC's on proper "log roll" tech and increased time  Transfers Overall transfer level: Needs assistance Equipment used: Rolling walker (2 wheeled) Transfers: Sit to/from Stand Sit to Stand: Min assist Stand pivot transfers: Min assist       General transfer comment: 50% VC's on proper tech.  Pain overwhelming her cognition.    Ambulation/Gait Ambulation/Gait assistance: Min assist Ambulation Distance (Feet): 22 Feet Assistive device: Rolling walker (2 wheeled) Gait Pattern/deviations: Step-to pattern;Step-through pattern;Decreased step length - right;Decreased step length - left Gait velocity: decreased   General Gait Details: decreased amb distance due to pain   Stairs            Wheelchair Mobility    Modified Rankin (Stroke Patients Only)       Balance                                    Cognition Arousal/Alertness: Awake/alert Behavior During Therapy: WFL for tasks assessed/performed Overall Cognitive Status:  Within Functional Limits for tasks assessed                 General Comments: her nature is to typically say "I'm Okay" with minimal c/o's unless asked    Exercises      General Comments        Pertinent Vitals/Pain Pain Assessment: Faces Pain Score: 8  Faces Pain Scale: Hurts whole lot Pain Location: Back Pain Descriptors / Indicators: Discomfort;Grimacing Pain Intervention(s): Monitored during session;Repositioned;Ice applied    Home Living                      Prior Function            PT Goals (current goals can now be found in the care plan section) Progress towards PT goals: Progressing toward goals    Frequency    7X/week      PT Plan Current plan remains appropriate    Co-evaluation             End of Session Equipment Utilized During Treatment: Gait belt Activity Tolerance: Patient limited by pain Patient left: in bed;with bed alarm set;with family/visitor present;with call bell/phone within reach     Time: 1104-1120 PT Time Calculation (min) (ACUTE ONLY): 16 min  Charges:  $Gait Training: 8-22 mins                    G Codes:  Rica Koyanagi  PTA [facility]  Acute  Rehab Pager      [phone]

## 2016-09-13 NOTE — Progress Notes (Signed)
CSW met with pt / daughter at bedside to assist with d/c planning needs. Pt is ready for d/c today. Daughter does not feel comfortable with pt returning to [facility]. Admissions contacted at [facility] to check rehab availability. No rehab beds available at this time. Daughter has accepted a bed in ALF. Daughter understands that there will be no charge for this ST placement since pt has " free days" available at [facility] to use this year. D/C summary has been sent to ALF for review. Scripts / PASRR # included in d/c packet. # for report provided to nsg. Daughter will transport pt to ALF.  Werner Lean LCSW [phone]

## 2016-09-13 NOTE — Discharge Summary (Signed)
Physician Discharge Summary   Patient ID: Jennifer Wilkins MRN: [id number] DOB/AGE: 81-Jan-1933 81 y.o.  Admit date: 09/11/2016 Discharge date: 09/13/2016  Primary Diagnosis:   Stenosis L4-5  Admission Diagnoses:  Past Medical History:  Diagnosis Date  . Arthritis   . Elevated BP 04/21/2014  . HTN (hypertension)   . Hypercholesteremia   . Hypocalcemia 06/19/2013  . Low back pain radiating to both legs 08/27/2015  . Medicare annual wellness visit, subsequent 05/03/2015  . Preventative health care 06/02/2016  . Stress reaction 04/25/2015   Discharge Diagnoses:   Principal Problem:   Spinal stenosis of lumbar region Active Problems:   Lumbar spinal stenosis  Procedure:  Procedure(s) (LRB): Microlumbar Decompression L3-4 and L4-5, lateral mass fusion at L4-5 using autologous, autograft bone (N/A)   Consults: None  HPI:  See H&P    Laboratory Data: Hospital Outpatient Visit on 09/03/2016  Component Date Value Ref Range Status  . Sodium 09/03/2016 141  135 - 145 mmol/L Final  . Potassium 09/03/2016 4.2  3.5 - 5.1 mmol/L Final  . Chloride 09/03/2016 106  101 - 111 mmol/L Final  . CO2 09/03/2016 28  22 - 32 mmol/L Final  . Glucose, Bld 09/03/2016 99  65 - 99 mg/dL Final  . BUN 09/03/2016 25* 6 - 20 mg/dL Final  . Creatinine, Ser 09/03/2016 0.94  0.44 - 1.00 mg/dL Final  . Calcium 09/03/2016 9.1  8.9 - 10.3 mg/dL Final  . GFR calc non Af Amer 09/03/2016 55* >60 mL/min Final  . GFR calc Af Amer 09/03/2016 >60  >60 mL/min Final   Comment: (NOTE) The eGFR has been calculated using the CKD EPI equation. This calculation has not been validated in all clinical situations. eGFR's persistently <60 mL/min signify possible Chronic Kidney Disease.   . Anion gap 09/03/2016 7  5 - 15 Final  . WBC 09/03/2016 6.3  4.0 - 10.5 K/uL Final  . RBC 09/03/2016 4.65  3.87 - 5.11 MIL/uL Final  . Hemoglobin 09/03/2016 13.6  12.0 - 15.0 g/dL Final  . HCT 09/03/2016 40.7  36.0 - 46.0 % Final  . MCV  09/03/2016 87.5  78.0 - 100.0 fL Final  . MCH 09/03/2016 29.2  26.0 - 34.0 pg Final  . MCHC 09/03/2016 33.4  30.0 - 36.0 g/dL Final  . RDW 09/03/2016 13.9  11.5 - 15.5 % Final  . Platelets 09/03/2016 207  150 - 400 K/uL Final  . MRSA, PCR 09/03/2016 NEGATIVE  NEGATIVE Final  . Staphylococcus aureus 09/03/2016 NEGATIVE  NEGATIVE Final   Comment:        The Xpert SA Assay (FDA approved for NASAL specimens in patients over 70 years of age), is one component of a comprehensive surveillance program.  Test performance has been validated by [facility] for patients greater than or equal to 4 year old. It is not intended to diagnose infection nor to guide or monitor treatment.     Recent Labs  09/12/16 0413  HGB 11.2*    Recent Labs  09/12/16 0413  WBC 9.4  RBC 3.83*  HCT 33.5*  PLT 161    Recent Labs  09/12/16 0413  NA 139  K 3.9  CL 106  CO2 26  BUN 19  CREATININE 0.79  GLUCOSE 186*  CALCIUM 7.9*   No results for input(s): LABPT, INR in the last 72 hours.  X-Rays:Dg Lumbar Spine 2-3 Views  Result Date: 09/03/2016 CLINICAL DATA:  Lumbar disc disease.  Spondylolisthesis. EXAM: LUMBAR SPINE -  2-3 VIEW COMPARISON:  None. FINDINGS: There 5 typical lumbar segments. There is grade 1-2 spondylolisthesis of L4 on L5 with bilateral facet arthritis at L4-5. There is disc space narrowing throughout the lumbar spine. No fractures or bone destruction. Slight chronic wedge deformity of T12. IMPRESSION: Multilevel degenerative disc disease with grade 1-2 spondylolisthesis of L4 on L5 secondary to facet arthritis. Electronically Signed   By: Lorriane Shire M.D.   On: 09/03/2016 10:59   Dg Spine Portable 1 View  Result Date: 09/11/2016 CLINICAL DATA:  Surgical level L4-5 EXAM: PORTABLE SPINE - 1 VIEW COMPARISON:  09/03/2016 FINDINGS: Posterior surgical instruments are directed at the L4 and L5 vertebral bodies. IMPRESSION: Intraoperative localization as above. Electronically Signed    By: Rolm Baptise M.D.   On: 09/11/2016 12:47   Dg Spine Portable 1 View  Result Date: 09/11/2016 CLINICAL DATA:  Surgical level L4-5. EXAM: PORTABLE SPINE - 1 VIEW COMPARISON:  09/03/2016 FINDINGS: Posterior surgical instruments project over the L3 and L4 spinous processes. IMPRESSION: Intraoperative localization as above. Electronically Signed   By: Rolm Baptise M.D.   On: 09/11/2016 11:52   Dg Spine Portable 1 View  Result Date: 09/11/2016 CLINICAL DATA:  Lumbar spine surgery. EXAM: PORTABLE SPINE - 1 VIEW COMPARISON:  09/03/2016. FINDINGS: Lumbar spine numbered as per prior study of 09/03/2016. Metallic markers noted posteriorly at the L4-L5 and L5-S1 disc space levels. Stable anterolisthesis L4 on L5. Diffuse degenerative change. Aortoiliac atherosclerotic vascular disease. IMPRESSION: Metallic markers noted posteriorly at the L4-L5 and L5-S1 disc space levels. Electronically Signed   By: Marcello Moores  Register   On: 09/11/2016 11:38    EKG: Orders placed or performed in visit on 08/20/16  . EKG 12-Lead     Hospital Course: Patient was admitted to [facility] and taken to the OR and underwent the above state procedure without complications.  Patient tolerated the procedure well and was later transferred to the recovery room and then to the orthopaedic floor for postoperative care.  They were given PO and IV analgesics for pain control following their surgery.  They were given 24 hours of postoperative antibiotics.   PT was consulted postop to assist with mobility and transfers.  The patient was allowed to be WBAT with therapy and was taught back precautions. Discharge planning was consulted to help with postop disposition and equipment needs.  Patient had a fair night on the evening of surgery and started to get up OOB with therapy on day one. Patient was seen in rounds and was ready to go home on day two. She was hypotensive and nauseous on post-op day 1 limiting her PT/OT progress. They were  given discharge instructions and dressing directions.  They were instructed on when to follow up in the office with Dr. Tonita Cong.   Diet: Regular diet Activity:WBAT; Lspine precautions Follow-up: 10-14 days Disposition - Assisted Living Discharged Condition: good    Allergies as of 09/13/2016   No Known Allergies     Medication List    STOP taking these medications   simvastatin 40 MG tablet Commonly known as:  ZOCOR     TAKE these medications   aspirin 81 MG tablet Take 1 tablet (81 mg total) by mouth daily. Resume 4 days post-op What changed:  additional instructions   CALCIUM 600/VITAMIN D 600-400 MG-UNIT tablet Generic drug:  Calcium Carbonate-Vitamin D Take 1 tablet by mouth 2 (two) times daily.   carvedilol 12.5 MG tablet Commonly known as:  COREG TAKE 1  TABLET DAILY   cholecalciferol 1000 units tablet Commonly known as:  VITAMIN D Take 1,000 Units by mouth daily.   docusate sodium 100 MG capsule Commonly known as:  COLACE Take 1 capsule (100 mg total) by mouth 2 (two) times daily as needed for mild constipation.   FIBER ADULT GUMMIES PO Take 2 each by mouth.   hydrochlorothiazide 12.5 MG capsule Commonly known as:  MICROZIDE TAKE 1 CAPSULE DAILY   multivitamin with minerals tablet Take 1 tablet by mouth daily. Resume 5 days post-op What changed:  additional instructions   HAIR/SKIN/NAILS/BIOTIN Tabs Take 1 tablet by mouth daily. Resume 5 days post-op What changed:  additional instructions   OSTEO BI-FLEX ADV JOINT SHIELD Tabs Take 1 tablet by mouth daily. Resume 5 days post-op What changed:  how much to take  additional instructions   oxyCODONE-acetaminophen 5-325 MG tablet Commonly known as:  PERCOCET/ROXICET Take 1-2 tablets by mouth every 6 (six) hours as needed for moderate pain.   polyethylene glycol packet Commonly known as:  MIRALAX / GLYCOLAX Take 17 g by mouth daily.   PROBIOTIC DAILY PO Take by mouth.   Turmeric 500 MG  Caps Take 1 capsule by mouth daily. Resume 5 days post-op as needed What changed:  additional instructions   Vitamin B-12 CR 1500 MCG Tbcr Take by mouth daily.   vitamin E 400 UNIT capsule Take 1 capsule (400 Units total) by mouth daily. Resume 5 days post-op What changed:  additional instructions            Durable Medical Equipment        Start     Ordered   09/12/16 1912  For home use only DME Walker rolling  Once    Comments:  YOUTH WALKER  Question:  Patient needs a walker to treat with the following condition  Answer:  Lumbar back pain   09/12/16 1913     Follow-up Information    BEANE,JEFFREY C, MD Follow up in 2 week(s).   Specialty:  Orthopedic Surgery Contact information: [address] [place] [address] [phone]           Signed: Lacie Draft PA-C Orthopaedic Surgery 09/13/2016, 10:40 AM

## 2016-09-13 NOTE — Progress Notes (Signed)
Occupational Therapy Treatment Patient Details Name: Jennifer Wilkins MRN: [id number] DOB: [birth year] Today's Date: 09/13/2016    History of present illness Pt s/p L 3-4 L4-5 lumbar decompression and L4-5 lateral mass fusion   OT comments  Pain limiting pt this day  Follow Up Recommendations  SNF    Equipment Recommendations  None recommended by OT    Recommendations for Other Services      Precautions / Restrictions Precautions Precautions: Back;Fall       Mobility Bed Mobility Overal bed mobility: Needs Assistance Bed Mobility: Sit to Sidelying Rolling: Mod assist Sidelying to sit: Mod assist     Sit to sidelying: Mod assist    Transfers Overall transfer level: Needs assistance Equipment used: Rolling walker (2 wheeled) Transfers: Sit to/from [facility] Sit to Stand: Min assist Stand pivot transfers: Min assist       General transfer comment: pain limiting pt this day        ADL Overall ADL's : Needs assistance/impaired             Lower Body Bathing: Moderate assistance;Sit to/from stand;Cueing for safety;Cueing for sequencing;Cueing for compensatory techniques;Cueing for back precautions Lower Body Bathing Details (indicate cue type and reason): pain limiting pt this day Upper Body Dressing : Set up;Sitting   Lower Body Dressing: Moderate assistance;Cueing for safety;Cueing for sequencing;Cueing for compensatory techniques;Adhering to back precautions   Toilet Transfer: Ambulation;Cueing for sequencing;Cueing for safety;Minimal assistance   Toileting- Clothing Manipulation and Hygiene: Minimal assistance;Sit to/from stand;Cueing for safety;Cueing for sequencing         General ADL Comments: pain limiting this pt. Pt has option of of rehab at her [facility]- feel pt would benefit from prior to returning to her apartment- pt agrees                Cognition   Behavior During Therapy: [facility] for tasks assessed/performed Overall  Cognitive Status: Within Functional Limits for tasks assessed                                    Pertinent Vitals/ Pain       Pain Score: 8  Pain Location: Back Pain Descriptors / Indicators: Aching;Sore Pain Intervention(s): Monitored during session         Frequency  Min 2X/week        Progress Toward Goals  OT Goals(current goals can now be found in the care plan section)  Progress towards OT goals: Progressing toward goals (pain limiting this day)     Plan Discharge plan needs to be updated    Co-evaluation                 End of Session Equipment Utilized During Treatment: Rolling walker   Activity Tolerance Patient tolerated treatment well   Patient Left with call bell/phone within reach;with family/visitor present;in bed   Nurse Communication Mobility status        Time: [id number] OT Time Calculation (min): 31 min  Charges: OT General Charges $OT Visit: 1 Procedure OT Treatments $Self Care/Home Management : 23-37 mins  Azaylea Maves, Thereasa Parkin 09/13/2016, 11:28 AM

## 2016-09-13 NOTE — Progress Notes (Signed)
Subjective: 2 Days Post-Op Procedure(s) (LRB): Microlumbar Decompression L3-4 and L4-5, lateral mass fusion at L4-5 using autologous, autograft bone (N/A) Patient reports pain as moderate.  Reports feeling better than yesterday Did not get to work with OT yesterday due to hypotension (improved today) Pt and her daughter are thinking she may require rehab stay for more assistance Reports incisional back pain. Percocet controlling well.   Objective: Vital signs in last 24 hours: Temp:  [97.5 F (36.4 C)-98.7 F (37.1 C)] 98.7 F (37.1 C) (02/16 0520) Pulse Rate:  [69-89] 89 (02/16 0520) Resp:  [14-16] 16 (02/16 0520) BP: (85-118)/(52-66) 115/66 (02/16 0520) SpO2:  [97 %-100 %] 98 % (02/16 0520)  Intake/Output from previous day: 02/15 0701 - 02/16 0700 In: 1380 [P.O.:780; I.V.:600] Out: 925 [Urine:925] Intake/Output this shift: No intake/output data recorded.   Recent Labs  09/12/16 0413  HGB 11.2*    Recent Labs  09/12/16 0413  WBC 9.4  RBC 3.83*  HCT 33.5*  PLT 161    Recent Labs  09/12/16 0413  NA 139  K 3.9  CL 106  CO2 26  BUN 19  CREATININE 0.79  GLUCOSE 186*  CALCIUM 7.9*   No results for input(s): LABPT, INR in the last 72 hours.  Neurologically intact ABD soft Neurovascular intact Sensation intact distally Intact pulses distally Dorsiflexion/Plantar flexion intact Incision: dressing C/D/I and no drainage No cellulitis present Compartment soft no sign of DVT  Assessment/Plan: 2 Days Post-Op Procedure(s) (LRB): Microlumbar Decompression L3-4 and L4-5, lateral mass fusion at L4-5 using autologous, autograft bone (N/A) Advance diet Up with therapy D/C IV fluids  Social work consult possible rehab placement, would like to see how she does with OT 1st, also discussed possible home health for assistance as well.  Plan D/C later today - home with assistance vs rehab depending on OT eval Will discuss with Dr Mliss Fritz, [name] 09/13/2016, 8:06 AM

## 2016-09-13 NOTE — NC FL2 (Signed)
[place] LEVEL OF CARE SCREENING TOOL     IDENTIFICATION  Patient Name: Jennifer Wilkins Birthdate: 06/03/1932 Sex: female Admission Date (Current Location): 09/11/2016  [facility] and [place] Number:  Herbalist and Address:  [facility],  [address] [place], [place]      Provider Number: [id number]  Attending Physician Name and Address:  Susa Day, MD  Relative Name and Phone Number:       Current Level of Care: Hospital Recommended Level of Care: [place] Prior Approval Number:    Date Approved/Denied:   PASRR Number:    Discharge Plan: Other (Comment) (ALF)    Current Diagnoses: Patient Active Problem List   Diagnosis Date Noted  . Spinal stenosis of lumbar region 09/11/2016  . Lumbar spinal stenosis 09/11/2016  . Preventative health care 06/02/2016  . Low back pain radiating to both legs 08/27/2015  . Facial lesion 05/03/2015  . Medicare annual wellness visit, subsequent 05/03/2015  . Stress reaction 04/25/2015  . Hearing loss 01/21/2014  . Hypocalcemia 06/19/2013  . Osteoporosis 04/24/2012  . Hyperlipidemia, mixed 04/24/2012  . Vitamin D deficiency 04/24/2012  . Essential hypertension 04/24/2012    Orientation RESPIRATION BLADDER Height & Weight     Self, Time, Situation, Place  Normal Continent Weight: 109 lb (49.4 kg) Height:  4\' 8"  (142.2 cm)  BEHAVIORAL SYMPTOMS/MOOD NEUROLOGICAL BOWEL NUTRITION STATUS  Other (Comment) (no behaviors)   Continent Diet  AMBULATORY STATUS COMMUNICATION OF NEEDS Skin   Limited Assist Verbally Surgical wounds                       Personal Care Assistance Level of Assistance  Bathing, Feeding, Dressing Bathing Assistance: Limited assistance Feeding assistance: Independent Dressing Assistance: Limited assistance     Functional Limitations Info  Sight, Hearing, Speech Sight Info: Adequate Hearing Info: Adequate Speech Info: Adequate    SPECIAL CARE  FACTORS FREQUENCY  PT (By licensed PT), OT (By licensed OT)     PT Frequency: 3x wk OT Frequency: 3x wk            Contractures Contractures Info: Not present    Additional Factors Info  Code Status Code Status Info: Full Code             Current Medications (09/13/2016):  This is the current hospital active medication list Current Facility-Administered Medications  Medication Dose Route Frequency Provider Last Rate Last Dose  . acetaminophen (TYLENOL) tablet 650 mg  650 mg Oral Q4H PRN Susa Day, MD   650 mg at 09/12/16 0256   Or  . acetaminophen (TYLENOL) suppository 650 mg  650 mg Rectal Q4H PRN Susa Day, MD      . acidophilus (RISAQUAD) capsule 1 capsule  1 capsule Oral Daily Susa Day, MD   1 capsule at 09/13/16 0948  . alum & mag hydroxide-simeth (MAALOX/MYLANTA) 200-200-20 MG/5ML suspension 30 mL  30 mL Oral Q6H PRN Susa Day, MD      . bisacodyl (DULCOLAX) EC tablet 5 mg  5 mg Oral Daily PRN Susa Day, MD      . carvedilol (COREG) tablet 12.5 mg  12.5 mg Oral Daily Susa Day, MD      . docusate sodium (COLACE) capsule 100 mg  100 mg Oral BID Susa Day, MD   100 mg at 09/13/16 0948  . hydrochlorothiazide (MICROZIDE) capsule 12.5 mg  12.5 mg Oral Daily Susa Day, MD      .  HYDROcodone-acetaminophen (NORCO/VICODIN) 5-325 MG per tablet 1-2 tablet  1-2 tablet Oral Q4H PRN Susa Day, MD      . magnesium citrate solution 1 Bottle  1 Bottle Oral Once PRN Susa Day, MD      . menthol-cetylpyridinium (CEPACOL) lozenge 3 mg  1 lozenge Oral PRN Susa Day, MD       Or  . phenol (CHLORASEPTIC) mouth spray 1 spray  1 spray Mouth/Throat PRN Susa Day, MD      . methocarbamol (ROBAXIN) tablet 500 mg  500 mg Oral Q6H PRN Susa Day, MD   500 mg at 09/13/16 0525   Or  . methocarbamol (ROBAXIN) 500 mg in dextrose 5 % 50 mL IVPB  500 mg Intravenous Q6H PRN Susa Day, MD      . ondansetron (ZOFRAN) injection 4 mg  4 mg Intravenous Q4H  PRN Susa Day, MD   4 mg at 09/12/16 1322  . oxyCODONE-acetaminophen (PERCOCET/ROXICET) 5-325 MG per tablet 1-2 tablet  1-2 tablet Oral Q4H PRN Susa Day, MD   1 tablet at 09/13/16 0949  . polyethylene glycol (MIRALAX / GLYCOLAX) packet 17 g  17 g Oral Daily PRN Susa Day, MD      . vitamin B-12 (CYANOCOBALAMIN) tablet 1,500 mcg  1,500 mcg Oral Daily Susa Day, MD   1,500 mcg at 09/13/16 [id number]     Discharge Medications: Please see discharge summary for a list of discharge medications.  Relevant Imaging Results:  Relevant Lab Results:   Additional Information SS # [id number]  Thetis Schwimmer, Randall An, LCSW

## 2016-09-16 DIAGNOSIS — M48061 Spinal stenosis, lumbar region without neurogenic claudication: Secondary | ICD-10-CM | POA: Diagnosis not present

## 2016-09-16 DIAGNOSIS — M545 Low back pain: Secondary | ICD-10-CM | POA: Diagnosis not present

## 2016-09-16 DIAGNOSIS — Z4789 Encounter for other orthopedic aftercare: Secondary | ICD-10-CM | POA: Diagnosis not present

## 2016-09-16 DIAGNOSIS — R2681 Unsteadiness on feet: Secondary | ICD-10-CM | POA: Diagnosis not present

## 2016-09-16 DIAGNOSIS — R262 Difficulty in walking, not elsewhere classified: Secondary | ICD-10-CM | POA: Diagnosis not present

## 2016-09-16 DIAGNOSIS — M6281 Muscle weakness (generalized): Secondary | ICD-10-CM | POA: Diagnosis not present

## 2016-09-17 DIAGNOSIS — M545 Low back pain: Secondary | ICD-10-CM | POA: Diagnosis not present

## 2016-09-17 DIAGNOSIS — M48061 Spinal stenosis, lumbar region without neurogenic claudication: Secondary | ICD-10-CM | POA: Diagnosis not present

## 2016-09-17 DIAGNOSIS — Z4789 Encounter for other orthopedic aftercare: Secondary | ICD-10-CM | POA: Diagnosis not present

## 2016-09-17 DIAGNOSIS — R262 Difficulty in walking, not elsewhere classified: Secondary | ICD-10-CM | POA: Diagnosis not present

## 2016-09-17 DIAGNOSIS — R2681 Unsteadiness on feet: Secondary | ICD-10-CM | POA: Diagnosis not present

## 2016-09-17 DIAGNOSIS — M6281 Muscle weakness (generalized): Secondary | ICD-10-CM | POA: Diagnosis not present

## 2016-09-18 ENCOUNTER — Telehealth: Payer: Self-pay | Admitting: Family Medicine

## 2016-09-18 DIAGNOSIS — M6281 Muscle weakness (generalized): Secondary | ICD-10-CM | POA: Diagnosis not present

## 2016-09-18 DIAGNOSIS — R2681 Unsteadiness on feet: Secondary | ICD-10-CM | POA: Diagnosis not present

## 2016-09-18 DIAGNOSIS — M545 Low back pain: Secondary | ICD-10-CM | POA: Diagnosis not present

## 2016-09-18 DIAGNOSIS — M48061 Spinal stenosis, lumbar region without neurogenic claudication: Secondary | ICD-10-CM | POA: Diagnosis not present

## 2016-09-18 DIAGNOSIS — Z4789 Encounter for other orthopedic aftercare: Secondary | ICD-10-CM | POA: Diagnosis not present

## 2016-09-18 DIAGNOSIS — R262 Difficulty in walking, not elsewhere classified: Secondary | ICD-10-CM | POA: Diagnosis not present

## 2016-09-18 NOTE — Telephone Encounter (Signed)
She should be taking her Simvastatin 40 mg tabs, 1 tab po qhs see if they will take my VO

## 2016-09-18 NOTE — Telephone Encounter (Signed)
Caller name:Deborah Relationship to patient: Can be reached:[phone] Pharmacy:  Reason for call:Had back surgery 1 week ago, in Rehab at the moment. Rehab does not have orders for  cholesterol medicine, ok to be without?

## 2016-09-19 DIAGNOSIS — M48062 Spinal stenosis, lumbar region with neurogenic claudication: Secondary | ICD-10-CM | POA: Diagnosis not present

## 2016-09-19 DIAGNOSIS — E785 Hyperlipidemia, unspecified: Secondary | ICD-10-CM | POA: Diagnosis not present

## 2016-09-19 DIAGNOSIS — M48061 Spinal stenosis, lumbar region without neurogenic claudication: Secondary | ICD-10-CM | POA: Diagnosis not present

## 2016-09-19 DIAGNOSIS — M6281 Muscle weakness (generalized): Secondary | ICD-10-CM | POA: Diagnosis not present

## 2016-09-19 DIAGNOSIS — M545 Low back pain: Secondary | ICD-10-CM | POA: Diagnosis not present

## 2016-09-19 DIAGNOSIS — I10 Essential (primary) hypertension: Secondary | ICD-10-CM | POA: Diagnosis not present

## 2016-09-19 DIAGNOSIS — R262 Difficulty in walking, not elsewhere classified: Secondary | ICD-10-CM | POA: Diagnosis not present

## 2016-09-19 DIAGNOSIS — Z4789 Encounter for other orthopedic aftercare: Secondary | ICD-10-CM | POA: Diagnosis not present

## 2016-09-19 DIAGNOSIS — R2681 Unsteadiness on feet: Secondary | ICD-10-CM | POA: Diagnosis not present

## 2016-09-19 DIAGNOSIS — M810 Age-related osteoporosis without current pathological fracture: Secondary | ICD-10-CM | POA: Diagnosis not present

## 2016-09-19 NOTE — Telephone Encounter (Signed)
Called left msg to call back. 

## 2016-09-19 NOTE — Telephone Encounter (Signed)
Daughter informed of PCP instructions.

## 2016-09-20 DIAGNOSIS — Z4789 Encounter for other orthopedic aftercare: Secondary | ICD-10-CM | POA: Diagnosis not present

## 2016-09-20 DIAGNOSIS — R2681 Unsteadiness on feet: Secondary | ICD-10-CM | POA: Diagnosis not present

## 2016-09-20 DIAGNOSIS — M6281 Muscle weakness (generalized): Secondary | ICD-10-CM | POA: Diagnosis not present

## 2016-09-20 DIAGNOSIS — R262 Difficulty in walking, not elsewhere classified: Secondary | ICD-10-CM | POA: Diagnosis not present

## 2016-09-20 DIAGNOSIS — M48061 Spinal stenosis, lumbar region without neurogenic claudication: Secondary | ICD-10-CM | POA: Diagnosis not present

## 2016-09-20 DIAGNOSIS — M545 Low back pain: Secondary | ICD-10-CM | POA: Diagnosis not present

## 2016-09-23 DIAGNOSIS — M545 Low back pain: Secondary | ICD-10-CM | POA: Diagnosis not present

## 2016-09-23 DIAGNOSIS — Z4789 Encounter for other orthopedic aftercare: Secondary | ICD-10-CM | POA: Diagnosis not present

## 2016-09-23 DIAGNOSIS — R262 Difficulty in walking, not elsewhere classified: Secondary | ICD-10-CM | POA: Diagnosis not present

## 2016-09-23 DIAGNOSIS — M48061 Spinal stenosis, lumbar region without neurogenic claudication: Secondary | ICD-10-CM | POA: Diagnosis not present

## 2016-09-23 DIAGNOSIS — M6281 Muscle weakness (generalized): Secondary | ICD-10-CM | POA: Diagnosis not present

## 2016-09-23 DIAGNOSIS — R2681 Unsteadiness on feet: Secondary | ICD-10-CM | POA: Diagnosis not present

## 2016-09-24 DIAGNOSIS — R2681 Unsteadiness on feet: Secondary | ICD-10-CM | POA: Diagnosis not present

## 2016-09-24 DIAGNOSIS — M48061 Spinal stenosis, lumbar region without neurogenic claudication: Secondary | ICD-10-CM | POA: Diagnosis not present

## 2016-09-24 DIAGNOSIS — Z4789 Encounter for other orthopedic aftercare: Secondary | ICD-10-CM | POA: Diagnosis not present

## 2016-09-24 DIAGNOSIS — R262 Difficulty in walking, not elsewhere classified: Secondary | ICD-10-CM | POA: Diagnosis not present

## 2016-09-24 DIAGNOSIS — M545 Low back pain: Secondary | ICD-10-CM | POA: Diagnosis not present

## 2016-09-24 DIAGNOSIS — M6281 Muscle weakness (generalized): Secondary | ICD-10-CM | POA: Diagnosis not present

## 2016-09-25 DIAGNOSIS — Z4789 Encounter for other orthopedic aftercare: Secondary | ICD-10-CM | POA: Diagnosis not present

## 2016-09-25 DIAGNOSIS — M545 Low back pain: Secondary | ICD-10-CM | POA: Diagnosis not present

## 2016-09-25 DIAGNOSIS — M6281 Muscle weakness (generalized): Secondary | ICD-10-CM | POA: Diagnosis not present

## 2016-09-25 DIAGNOSIS — R2681 Unsteadiness on feet: Secondary | ICD-10-CM | POA: Diagnosis not present

## 2016-09-25 DIAGNOSIS — M48061 Spinal stenosis, lumbar region without neurogenic claudication: Secondary | ICD-10-CM | POA: Diagnosis not present

## 2016-09-25 DIAGNOSIS — R262 Difficulty in walking, not elsewhere classified: Secondary | ICD-10-CM | POA: Diagnosis not present

## 2016-09-26 DIAGNOSIS — Z4789 Encounter for other orthopedic aftercare: Secondary | ICD-10-CM | POA: Diagnosis not present

## 2016-09-26 DIAGNOSIS — M48062 Spinal stenosis, lumbar region with neurogenic claudication: Secondary | ICD-10-CM | POA: Diagnosis not present

## 2016-09-26 DIAGNOSIS — Z9889 Other specified postprocedural states: Secondary | ICD-10-CM | POA: Diagnosis not present

## 2016-09-26 DIAGNOSIS — M4316 Spondylolisthesis, lumbar region: Secondary | ICD-10-CM | POA: Diagnosis not present

## 2016-09-27 DIAGNOSIS — Z4789 Encounter for other orthopedic aftercare: Secondary | ICD-10-CM | POA: Diagnosis not present

## 2016-09-27 DIAGNOSIS — M6281 Muscle weakness (generalized): Secondary | ICD-10-CM | POA: Diagnosis not present

## 2016-09-27 DIAGNOSIS — M545 Low back pain: Secondary | ICD-10-CM | POA: Diagnosis not present

## 2016-09-27 DIAGNOSIS — M48061 Spinal stenosis, lumbar region without neurogenic claudication: Secondary | ICD-10-CM | POA: Diagnosis not present

## 2016-09-27 DIAGNOSIS — R262 Difficulty in walking, not elsewhere classified: Secondary | ICD-10-CM | POA: Diagnosis not present

## 2016-09-27 DIAGNOSIS — R2681 Unsteadiness on feet: Secondary | ICD-10-CM | POA: Diagnosis not present

## 2016-09-30 ENCOUNTER — Encounter: Payer: Self-pay | Admitting: Family Medicine

## 2016-09-30 ENCOUNTER — Ambulatory Visit (INDEPENDENT_AMBULATORY_CARE_PROVIDER_SITE_OTHER): Payer: Medicare Other | Admitting: Family Medicine

## 2016-09-30 VITALS — BP 118/72 | HR 78 | Temp 97.5°F | Resp 18 | Wt 107.2 lb

## 2016-09-30 DIAGNOSIS — I10 Essential (primary) hypertension: Secondary | ICD-10-CM

## 2016-09-30 DIAGNOSIS — M545 Low back pain, unspecified: Secondary | ICD-10-CM

## 2016-09-30 DIAGNOSIS — M48061 Spinal stenosis, lumbar region without neurogenic claudication: Secondary | ICD-10-CM | POA: Diagnosis not present

## 2016-09-30 DIAGNOSIS — D509 Iron deficiency anemia, unspecified: Secondary | ICD-10-CM

## 2016-09-30 DIAGNOSIS — D649 Anemia, unspecified: Secondary | ICD-10-CM

## 2016-09-30 HISTORY — DX: Anemia, unspecified: D64.9

## 2016-09-30 LAB — CBC
HEMATOCRIT: 37.5 % (ref 36.0–46.0)
Hemoglobin: 12.5 g/dL (ref 12.0–15.0)
MCHC: 33.4 g/dL (ref 30.0–36.0)
MCV: 87.4 fl (ref 78.0–100.0)
Platelets: 405 10*3/uL — ABNORMAL HIGH (ref 150.0–400.0)
RBC: 4.29 Mil/uL (ref 3.87–5.11)
RDW: 14.4 % (ref 11.5–15.5)
WBC: 6.9 10*3/uL (ref 4.0–10.5)

## 2016-09-30 LAB — COMPREHENSIVE METABOLIC PANEL
ALK PHOS: 81 U/L (ref 39–117)
ALT: 14 U/L (ref 0–35)
AST: 16 U/L (ref 0–37)
Albumin: 3.9 g/dL (ref 3.5–5.2)
BUN: 19 mg/dL (ref 6–23)
CO2: 32 meq/L (ref 19–32)
Calcium: 9.4 mg/dL (ref 8.4–10.5)
Chloride: 99 mEq/L (ref 96–112)
Creatinine, Ser: 0.7 mg/dL (ref 0.40–1.20)
GFR: 84.9 mL/min (ref >60.00)
GLUCOSE: 101 mg/dL — AB (ref 70–99)
POTASSIUM: 3.8 meq/L (ref 3.5–5.1)
SODIUM: 139 meq/L (ref 135–145)
TOTAL PROTEIN: 7 g/dL (ref 6.0–8.3)
Total Bilirubin: 0.5 mg/dL (ref 0.2–1.2)

## 2016-09-30 NOTE — Progress Notes (Signed)
Pre visit review using our clinic review tool, if applicable. No additional management support is needed unless otherwise documented below in the visit note. 

## 2016-09-30 NOTE — Patient Instructions (Addendum)
On the days you do not have physical therapy and occupational therapy Switch to a half of percocet and 1 whole tylenol in the am  In the evening take 1 tylenol and a half of percocet to help with sleep and pain.  If pain persists, go back to taking 1 percocet in the am and 1 percocet in the evening.   Lumbar Diskectomy, Care After Refer to this sheet in the next few weeks. These instructions provide you with information about caring for yourself after your procedure. Your health care provider may also give you more specific instructions. Your treatment has been planned according to current medical practices, but problems sometimes occur. Call your health care provider if you have any problems or questions after your procedure. What can I expect after the procedure? After the procedure, it is common to have:  Pain.  Numbness.  Weakness. Follow these instructions at home: Medicines   Take medicines only as directed by your health care provider.  If you were prescribed an antibiotic medicine, finish all of it even if you start to feel better. Incision care   There are many different ways to close and cover an incision, including stitches (sutures), skin glue, and adhesive strips. Follow your health care provider's instructions about:  Incision care.  Bandage (dressing) changes and removal.  Incision closure removal.  Check your incision area every day for signs of infection. If you cannot see your incision, have someone check it for you. Watch for:  Redness, swelling, or pain.  Fluid, blood, or pus. Activity   Avoid sitting for longer than 20 minutes at a time or as directed by your health care provider.  Do not climb stairs more than once each day until your health care provider approves.  Do not bend at your waist. To pick things up, bend your knees.  Do not lift anything that is heavier than 10 lb (4.5 kg) or as directed by your health care provider.  Do not drive a car  until your health care provider approves.  Ask your health care provider when you may return to your normal activities, such as playing sports and going back to work.  Work with your physical therapist to learn safe movement and exercises to help healing. Do these exercises as directed.  Take short walks often. General instructions   Do not use any tobacco products, including cigarettes, chewing tobacco, or electronic cigarettes. If you need help quitting, ask your health care provider.  Follow your health care provider's instructions about bathing. Do not take baths, shower, swim, or use a hot tub until your health care provider approves.  Wear your back brace as directed by your health care provider.  To prevent constipation:  Drink enough fluid to keep your urine clear or pale yellow.  Eat plenty of fruits, vegetables, and whole grains.  Keep all follow-up visits as directed by your health care provider. This is important. This includes any follow-up visits with your physical therapist. Contact a health care provider if:  You have a fever.  You have redness, swelling, or pain in your incision area.  Your pain is not controlled with medicine.  You have pain, numbness, or weakness that lasts longer than three weeks after surgery.  You become constipated. Get help right away if:  You have fluid, blood, or pus coming from your incision.  You have increasing pain, numbness, or weakness.  You lose control of when you urinate or have a bowel movement (incontinence).  You have chest pain.  You have trouble breathing. This information is not intended to replace advice given to you by your health care provider. Make sure you discuss any questions you have with your health care provider. Document Released: 06/19/2004 Document Revised: 12/21/2015 Document Reviewed: 03/09/2014 Elsevier Interactive Patient Education  2017 [employer].

## 2016-09-30 NOTE — Assessment & Plan Note (Signed)
Check cmp today 

## 2016-09-30 NOTE — Assessment & Plan Note (Signed)
Has been following with Dr Tonita Cong of ortho since her surgery is now back home with PT 3 x a week and OT 2 x a week. Is doing most of her ADLs but still needs supervision with bathing. Incision healing well. Is using Percocet bid is encouraged to try and minimize. Try 1/2 tab with 1 OTC Tylenol bid and continue to cut down. Needs med on PT/OT days. Will continue to follow with surgeon for pain

## 2016-09-30 NOTE — Assessment & Plan Note (Signed)
Well controlled, no changes to meds. Encouraged heart healthy diet such as the DASH diet and exercise as tolerated.  °

## 2016-09-30 NOTE — Assessment & Plan Note (Signed)
Increase leafy greens, consider increased lean red meat and using cast iron cookware. Continue to monitor, report any concerns 

## 2016-09-30 NOTE — Assessment & Plan Note (Signed)
Improved some since surgery, continue with Pt and OT

## 2016-09-30 NOTE — Progress Notes (Signed)
Subjective:  I acted as a Education administrator for Dr. Charlett Blake. [place], [place]   Patient ID: Jennifer Wilkins, female    DOB: 19-Jun-1932, 83 y.o.   MRN: [id number]  Chief Complaint  Patient presents with  . Follow-up    HPI  Patient is in today for 4 week follow up after her back surgery. She continues to struggle with back pain but it is improved after her surgery. She is using 1 Percocet twice daily. One in am prior to therapy and one at night to help her sleep. She has been trying to switch to Tylenol qhs but she has trouble sleeping as a result. She is doing OT twice a week and PT 3 x a week with decent results. Notes some mild constipation but with Docusate 2 x a day and some fiber gummies. Denies CP/palp/SOB/HA/congestion/fevers/GI or GU c/o. Taking meds as prescribed Patient Care Team: Mosie Lukes, MD as PCP - General (Family Medicine) Marica Otter, OD (Optometry)   Past Medical History:  Diagnosis Date  . Anemia 09/30/2016  . Arthritis   . Elevated BP 04/21/2014  . HTN (hypertension)   . Hypercholesteremia   . Hypocalcemia 06/19/2013  . Low back pain radiating to both legs 08/27/2015  . Medicare annual wellness visit, subsequent 05/03/2015  . Preventative health care 06/02/2016  . Stress reaction 04/25/2015    Past Surgical History:  Procedure Laterality Date  . CHOLECYSTECTOMY    . GALLBLADDER SURGERY  1984   Gallstone removal  . LUMBAR LAMINECTOMY/DECOMPRESSION MICRODISCECTOMY N/A 09/11/2016   Procedure: Microlumbar Decompression L3-4 and L4-5, lateral mass fusion at L4-5 using autologous, autograft bone;  Surgeon: Susa Day, MD;  Location: [facility];  Service: Orthopedics;  Laterality: N/A;    Family History  Problem Relation Age of Onset  . Osteoporosis Mother   . Ulcers Father   . Kidney disease Paternal Grandfather     Social History   Social History  . Marital status: Married    Spouse name: N/A  . Number of children: N/A  . Years of education: N/A   Occupational History    . Not on file.   Social History Main Topics  . Smoking status: Never Smoker  . Smokeless tobacco: Never Used  . Alcohol use No  . Drug use: No  . Sexual activity: Not Currently   Other Topics Concern  . Not on file   Social History Narrative   Widowed July 2017    Outpatient Medications Prior to Visit  Medication Sig Dispense Refill  . aspirin 81 MG tablet Take 1 tablet (81 mg total) by mouth daily. Resume 4 days post-op 30 tablet   . Calcium Carbonate-Vitamin D (CALCIUM 600/VITAMIN D) 600-400 MG-UNIT per tablet Take 1 tablet by mouth 2 (two) times daily.    . carvedilol (COREG) 12.5 MG tablet TAKE 1 TABLET DAILY 90 tablet 0  . cholecalciferol (VITAMIN D) 1000 units tablet Take 1,000 Units by mouth daily.    . Cyanocobalamin (VITAMIN B-12 CR) 1500 MCG TBCR Take by mouth daily.    Marland Kitchen docusate sodium (COLACE) 100 MG capsule Take 1 capsule (100 mg total) by mouth 2 (two) times daily as needed for mild constipation. 30 capsule 1  . FIBER ADULT GUMMIES PO Take 2 each by mouth.    . hydrochlorothiazide (MICROZIDE) 12.5 MG capsule TAKE 1 CAPSULE DAILY 90 capsule 0  . Misc Natural Products (OSTEO BI-FLEX ADV JOINT SHIELD) TABS Take 1 tablet by mouth daily. Resume 5 days post-op    .  Multiple Vitamins-Minerals (HAIR/SKIN/NAILS/BIOTIN) TABS Take 1 tablet by mouth daily. Resume 5 days post-op    . Multiple Vitamins-Minerals (MULTIVITAMIN WITH MINERALS) tablet Take 1 tablet by mouth daily. Resume 5 days post-op    . oxyCODONE-acetaminophen (PERCOCET/ROXICET) 5-325 MG tablet Take 1-2 tablets by mouth every 6 (six) hours as needed for moderate pain. 50 tablet 0  . polyethylene glycol (MIRALAX / GLYCOLAX) packet Take 17 g by mouth daily. 14 each 0  . Probiotic Product (PROBIOTIC DAILY PO) Take by mouth.    . Turmeric 500 MG CAPS Take 1 capsule by mouth daily. Resume 5 days post-op as needed    . vitamin E 400 UNIT capsule Take 1 capsule (400 Units total) by mouth daily. Resume 5 days post-op      No facility-administered medications prior to visit.     No Known Allergies  Review of Systems  Constitutional: Positive for malaise/fatigue. Negative for fever.  HENT: Negative for congestion.   Eyes: Negative for blurred vision.  Respiratory: Negative for cough and shortness of breath.   Cardiovascular: Negative for chest pain, palpitations and leg swelling.  Gastrointestinal: Negative for vomiting.  Musculoskeletal: Positive for back pain.  Skin: Negative for rash.  Neurological: Negative for loss of consciousness and headaches.       Objective:    Physical Exam  Constitutional: She is oriented to person, place, and time. She appears well-developed and well-nourished. No distress.  HENT:  Head: Normocephalic and atraumatic.  Eyes: Conjunctivae are normal.  Neck: Normal range of motion. No thyromegaly present.  Cardiovascular: Normal rate and regular rhythm.   Pulmonary/Chest: Effort normal and breath sounds normal. She has no wheezes.  Abdominal: Soft. Bowel sounds are normal. There is no tenderness.  Musculoskeletal: Normal range of motion. She exhibits no edema or deformity.  Incision low back no erythema, discharge, swelling. steristrip in place.   Neurological: She is alert and oriented to person, place, and time.  Skin: Skin is warm and dry. She is not diaphoretic.  Psychiatric: She has a normal mood and affect.    BP 118/72 (BP Location: Left Arm, Patient Position: Sitting, Cuff Size: Normal)   Pulse 78   Temp 97.5 F (36.4 C) (Oral)   Resp 18   Wt 107 lb 3.2 oz (48.6 kg)   SpO2 93%   BMI 24.03 kg/m  Wt Readings from Last 3 Encounters:  09/30/16 107 lb 3.2 oz (48.6 kg)  09/11/16 109 lb (49.4 kg)  09/03/16 109 lb (49.4 kg)     Lab Results  Component Value Date   WBC 9.4 09/12/2016   HGB 11.2 (L) 09/12/2016   HCT 33.5 (L) 09/12/2016   PLT 161 09/12/2016   GLUCOSE 186 (H) 09/12/2016   CHOL 189 05/28/2016   TRIG 83.0 05/28/2016   HDL 94.10  05/28/2016   LDLCALC 78 05/28/2016   ALT 17 05/28/2016   AST 19 05/28/2016   NA 139 09/12/2016   K 3.9 09/12/2016   CL 106 09/12/2016   CREATININE 0.79 09/12/2016   BUN 19 09/12/2016   CO2 26 09/12/2016   TSH 1.81 05/28/2016    Lab Results  Component Value Date   TSH 1.81 05/28/2016   Lab Results  Component Value Date   WBC 9.4 09/12/2016   HGB 11.2 (L) 09/12/2016   HCT 33.5 (L) 09/12/2016   MCV 87.5 09/12/2016   PLT 161 09/12/2016   Lab Results  Component Value Date   NA 139 09/12/2016   K 3.9 09/12/2016  CO2 26 09/12/2016   GLUCOSE 186 (H) 09/12/2016   BUN 19 09/12/2016   CREATININE 0.79 09/12/2016   BILITOT 0.7 05/28/2016   ALKPHOS 54 05/28/2016   AST 19 05/28/2016   ALT 17 05/28/2016   PROT 7.0 05/28/2016   ALBUMIN 4.1 05/28/2016   CALCIUM 7.9 (L) 09/12/2016   ANIONGAP 7 09/12/2016   GFR 77.28 05/28/2016   Lab Results  Component Value Date   CHOL 189 05/28/2016   Lab Results  Component Value Date   HDL 94.10 05/28/2016   Lab Results  Component Value Date   LDLCALC 78 05/28/2016   Lab Results  Component Value Date   TRIG 83.0 05/28/2016   Lab Results  Component Value Date   CHOLHDL 2 05/28/2016   No results found for: HGBA1C     Assessment & Plan:   Problem List Items Addressed This Visit    Essential hypertension - Primary    Well controlled, no changes to meds. Encouraged heart healthy diet such as the DASH diet and exercise as tolerated.       Relevant Orders   CBC   Comprehensive metabolic panel   Hypocalcemia    Check cmp today      Low back pain radiating to both legs    Improved some since surgery, continue with Pt and OT      Spinal stenosis of lumbar region    Has been following with Dr Tonita Cong of ortho since her surgery is now back home with PT 3 x a week and OT 2 x a week. Is doing most of her ADLs but still needs supervision with bathing. Incision healing well. Is using Percocet bid is encouraged to try and minimize.  Try 1/2 tab with 1 OTC Tylenol bid and continue to cut down. Needs med on PT/OT days. Will continue to follow with surgeon for pain      Anemia    Increase leafy greens, consider increased lean red meat and using cast iron cookware. Continue to monitor, report any concerns      Relevant Orders   CBC   Comprehensive metabolic panel      I am having Ms. Gruszka maintain her Vitamin B-12 CR, Calcium Carbonate-Vitamin D, carvedilol, hydrochlorothiazide, Probiotic Product (PROBIOTIC DAILY PO), FIBER ADULT GUMMIES PO, cholecalciferol, docusate sodium, polyethylene glycol, aspirin, multivitamin with minerals, HAIR/SKIN/NAILS/BIOTIN, OSTEO BI-FLEX ADV JOINT SHIELD, Turmeric, vitamin E, and oxyCODONE-acetaminophen.  No orders of the defined types were placed in this encounter.   CMA served as Education administrator during this visit. History, Physical and Plan performed by medical provider. Documentation and orders reviewed and attested to.  Penni Homans, MD

## 2016-10-01 DIAGNOSIS — Z4789 Encounter for other orthopedic aftercare: Secondary | ICD-10-CM | POA: Diagnosis not present

## 2016-10-01 DIAGNOSIS — M545 Low back pain: Secondary | ICD-10-CM | POA: Diagnosis not present

## 2016-10-01 DIAGNOSIS — M6281 Muscle weakness (generalized): Secondary | ICD-10-CM | POA: Diagnosis not present

## 2016-10-01 DIAGNOSIS — R262 Difficulty in walking, not elsewhere classified: Secondary | ICD-10-CM | POA: Diagnosis not present

## 2016-10-01 DIAGNOSIS — R2681 Unsteadiness on feet: Secondary | ICD-10-CM | POA: Diagnosis not present

## 2016-10-01 DIAGNOSIS — M48061 Spinal stenosis, lumbar region without neurogenic claudication: Secondary | ICD-10-CM | POA: Diagnosis not present

## 2016-10-02 DIAGNOSIS — R2681 Unsteadiness on feet: Secondary | ICD-10-CM | POA: Diagnosis not present

## 2016-10-02 DIAGNOSIS — R262 Difficulty in walking, not elsewhere classified: Secondary | ICD-10-CM | POA: Diagnosis not present

## 2016-10-02 DIAGNOSIS — M545 Low back pain: Secondary | ICD-10-CM | POA: Diagnosis not present

## 2016-10-02 DIAGNOSIS — M6281 Muscle weakness (generalized): Secondary | ICD-10-CM | POA: Diagnosis not present

## 2016-10-02 DIAGNOSIS — Z4789 Encounter for other orthopedic aftercare: Secondary | ICD-10-CM | POA: Diagnosis not present

## 2016-10-02 DIAGNOSIS — M48061 Spinal stenosis, lumbar region without neurogenic claudication: Secondary | ICD-10-CM | POA: Diagnosis not present

## 2016-10-03 DIAGNOSIS — M545 Low back pain: Secondary | ICD-10-CM | POA: Diagnosis not present

## 2016-10-03 DIAGNOSIS — R2681 Unsteadiness on feet: Secondary | ICD-10-CM | POA: Diagnosis not present

## 2016-10-03 DIAGNOSIS — M48061 Spinal stenosis, lumbar region without neurogenic claudication: Secondary | ICD-10-CM | POA: Diagnosis not present

## 2016-10-03 DIAGNOSIS — Z4789 Encounter for other orthopedic aftercare: Secondary | ICD-10-CM | POA: Diagnosis not present

## 2016-10-03 DIAGNOSIS — M6281 Muscle weakness (generalized): Secondary | ICD-10-CM | POA: Diagnosis not present

## 2016-10-03 DIAGNOSIS — R262 Difficulty in walking, not elsewhere classified: Secondary | ICD-10-CM | POA: Diagnosis not present

## 2016-10-04 DIAGNOSIS — R262 Difficulty in walking, not elsewhere classified: Secondary | ICD-10-CM | POA: Diagnosis not present

## 2016-10-04 DIAGNOSIS — Z4789 Encounter for other orthopedic aftercare: Secondary | ICD-10-CM | POA: Diagnosis not present

## 2016-10-04 DIAGNOSIS — M48061 Spinal stenosis, lumbar region without neurogenic claudication: Secondary | ICD-10-CM | POA: Diagnosis not present

## 2016-10-04 DIAGNOSIS — M6281 Muscle weakness (generalized): Secondary | ICD-10-CM | POA: Diagnosis not present

## 2016-10-04 DIAGNOSIS — M545 Low back pain: Secondary | ICD-10-CM | POA: Diagnosis not present

## 2016-10-04 DIAGNOSIS — R2681 Unsteadiness on feet: Secondary | ICD-10-CM | POA: Diagnosis not present

## 2016-10-07 DIAGNOSIS — M545 Low back pain: Secondary | ICD-10-CM | POA: Diagnosis not present

## 2016-10-07 DIAGNOSIS — R2681 Unsteadiness on feet: Secondary | ICD-10-CM | POA: Diagnosis not present

## 2016-10-07 DIAGNOSIS — M6281 Muscle weakness (generalized): Secondary | ICD-10-CM | POA: Diagnosis not present

## 2016-10-07 DIAGNOSIS — R262 Difficulty in walking, not elsewhere classified: Secondary | ICD-10-CM | POA: Diagnosis not present

## 2016-10-07 DIAGNOSIS — Z4789 Encounter for other orthopedic aftercare: Secondary | ICD-10-CM | POA: Diagnosis not present

## 2016-10-07 DIAGNOSIS — M48061 Spinal stenosis, lumbar region without neurogenic claudication: Secondary | ICD-10-CM | POA: Diagnosis not present

## 2016-10-08 DIAGNOSIS — R262 Difficulty in walking, not elsewhere classified: Secondary | ICD-10-CM | POA: Diagnosis not present

## 2016-10-08 DIAGNOSIS — M545 Low back pain: Secondary | ICD-10-CM | POA: Diagnosis not present

## 2016-10-08 DIAGNOSIS — M6281 Muscle weakness (generalized): Secondary | ICD-10-CM | POA: Diagnosis not present

## 2016-10-08 DIAGNOSIS — R2681 Unsteadiness on feet: Secondary | ICD-10-CM | POA: Diagnosis not present

## 2016-10-08 DIAGNOSIS — Z4789 Encounter for other orthopedic aftercare: Secondary | ICD-10-CM | POA: Diagnosis not present

## 2016-10-08 DIAGNOSIS — M48061 Spinal stenosis, lumbar region without neurogenic claudication: Secondary | ICD-10-CM | POA: Diagnosis not present

## 2016-10-09 DIAGNOSIS — R2681 Unsteadiness on feet: Secondary | ICD-10-CM | POA: Diagnosis not present

## 2016-10-09 DIAGNOSIS — M48061 Spinal stenosis, lumbar region without neurogenic claudication: Secondary | ICD-10-CM | POA: Diagnosis not present

## 2016-10-09 DIAGNOSIS — R262 Difficulty in walking, not elsewhere classified: Secondary | ICD-10-CM | POA: Diagnosis not present

## 2016-10-09 DIAGNOSIS — M6281 Muscle weakness (generalized): Secondary | ICD-10-CM | POA: Diagnosis not present

## 2016-10-09 DIAGNOSIS — M545 Low back pain: Secondary | ICD-10-CM | POA: Diagnosis not present

## 2016-10-09 DIAGNOSIS — Z4789 Encounter for other orthopedic aftercare: Secondary | ICD-10-CM | POA: Diagnosis not present

## 2016-10-10 DIAGNOSIS — M6281 Muscle weakness (generalized): Secondary | ICD-10-CM | POA: Diagnosis not present

## 2016-10-10 DIAGNOSIS — M545 Low back pain: Secondary | ICD-10-CM | POA: Diagnosis not present

## 2016-10-10 DIAGNOSIS — Z4789 Encounter for other orthopedic aftercare: Secondary | ICD-10-CM | POA: Diagnosis not present

## 2016-10-10 DIAGNOSIS — R2681 Unsteadiness on feet: Secondary | ICD-10-CM | POA: Diagnosis not present

## 2016-10-10 DIAGNOSIS — R262 Difficulty in walking, not elsewhere classified: Secondary | ICD-10-CM | POA: Diagnosis not present

## 2016-10-10 DIAGNOSIS — M48061 Spinal stenosis, lumbar region without neurogenic claudication: Secondary | ICD-10-CM | POA: Diagnosis not present

## 2016-10-14 DIAGNOSIS — R2681 Unsteadiness on feet: Secondary | ICD-10-CM | POA: Diagnosis not present

## 2016-10-14 DIAGNOSIS — R262 Difficulty in walking, not elsewhere classified: Secondary | ICD-10-CM | POA: Diagnosis not present

## 2016-10-14 DIAGNOSIS — M48061 Spinal stenosis, lumbar region without neurogenic claudication: Secondary | ICD-10-CM | POA: Diagnosis not present

## 2016-10-14 DIAGNOSIS — Z4789 Encounter for other orthopedic aftercare: Secondary | ICD-10-CM | POA: Diagnosis not present

## 2016-10-14 DIAGNOSIS — M545 Low back pain: Secondary | ICD-10-CM | POA: Diagnosis not present

## 2016-10-14 DIAGNOSIS — M6281 Muscle weakness (generalized): Secondary | ICD-10-CM | POA: Diagnosis not present

## 2016-10-16 DIAGNOSIS — M545 Low back pain: Secondary | ICD-10-CM | POA: Diagnosis not present

## 2016-10-16 DIAGNOSIS — M6281 Muscle weakness (generalized): Secondary | ICD-10-CM | POA: Diagnosis not present

## 2016-10-16 DIAGNOSIS — R262 Difficulty in walking, not elsewhere classified: Secondary | ICD-10-CM | POA: Diagnosis not present

## 2016-10-16 DIAGNOSIS — R2681 Unsteadiness on feet: Secondary | ICD-10-CM | POA: Diagnosis not present

## 2016-10-16 DIAGNOSIS — M48061 Spinal stenosis, lumbar region without neurogenic claudication: Secondary | ICD-10-CM | POA: Diagnosis not present

## 2016-10-16 DIAGNOSIS — Z4789 Encounter for other orthopedic aftercare: Secondary | ICD-10-CM | POA: Diagnosis not present

## 2016-10-17 DIAGNOSIS — M48061 Spinal stenosis, lumbar region without neurogenic claudication: Secondary | ICD-10-CM | POA: Diagnosis not present

## 2016-10-17 DIAGNOSIS — M6281 Muscle weakness (generalized): Secondary | ICD-10-CM | POA: Diagnosis not present

## 2016-10-17 DIAGNOSIS — R262 Difficulty in walking, not elsewhere classified: Secondary | ICD-10-CM | POA: Diagnosis not present

## 2016-10-17 DIAGNOSIS — M545 Low back pain: Secondary | ICD-10-CM | POA: Diagnosis not present

## 2016-10-17 DIAGNOSIS — Z4789 Encounter for other orthopedic aftercare: Secondary | ICD-10-CM | POA: Diagnosis not present

## 2016-10-17 DIAGNOSIS — R2681 Unsteadiness on feet: Secondary | ICD-10-CM | POA: Diagnosis not present

## 2016-10-18 ENCOUNTER — Telehealth: Payer: Self-pay | Admitting: Family Medicine

## 2016-10-18 MED ORDER — HYDROCHLOROTHIAZIDE 12.5 MG PO CAPS: 12.5000 mg | ORAL_CAPSULE | Freq: Every day | ORAL | 1 refills | Status: DC

## 2016-10-18 NOTE — Telephone Encounter (Signed)
Spoke with pt. She states she has no refills remaining on HCTZ and would like 90 day supply to [facility]. Refill sent.

## 2016-10-18 NOTE — Op Note (Signed)
NAME:  Jennifer Wilkins, Jennifer Wilkins NO.:  MEDICAL RECORD NO.:  [id number]  LOCATION:                                 FACILITY:  PHYSICIAN:  Susa Day, M.D.    DATE OF BIRTH:  02-05-1932  DATE OF PROCEDURE:  10/17/2016 DATE OF DISCHARGE:                              OPERATIVE REPORT   ADDENDUM  PROCEDURE PERFORMED:  Instead of a Gill laminectomy at L4 she had a central laminectomy at L4.  Please make that change to the operative note of September 11, 2016.     Susa Day, M.D.     Jennifer Wilkins  D:  10/17/2016  T:  10/17/2016  Job:  [id number]

## 2016-10-18 NOTE — Telephone Encounter (Signed)
Patient has questions regarding her medication and would like for someone to give her a call.

## 2016-10-21 DIAGNOSIS — R262 Difficulty in walking, not elsewhere classified: Secondary | ICD-10-CM | POA: Diagnosis not present

## 2016-10-21 DIAGNOSIS — M545 Low back pain: Secondary | ICD-10-CM | POA: Diagnosis not present

## 2016-10-21 DIAGNOSIS — Z4789 Encounter for other orthopedic aftercare: Secondary | ICD-10-CM | POA: Diagnosis not present

## 2016-10-21 DIAGNOSIS — M6281 Muscle weakness (generalized): Secondary | ICD-10-CM | POA: Diagnosis not present

## 2016-10-21 DIAGNOSIS — R2681 Unsteadiness on feet: Secondary | ICD-10-CM | POA: Diagnosis not present

## 2016-10-21 DIAGNOSIS — M48061 Spinal stenosis, lumbar region without neurogenic claudication: Secondary | ICD-10-CM | POA: Diagnosis not present

## 2016-10-22 DIAGNOSIS — R2681 Unsteadiness on feet: Secondary | ICD-10-CM | POA: Diagnosis not present

## 2016-10-22 DIAGNOSIS — M545 Low back pain: Secondary | ICD-10-CM | POA: Diagnosis not present

## 2016-10-22 DIAGNOSIS — R262 Difficulty in walking, not elsewhere classified: Secondary | ICD-10-CM | POA: Diagnosis not present

## 2016-10-22 DIAGNOSIS — M6281 Muscle weakness (generalized): Secondary | ICD-10-CM | POA: Diagnosis not present

## 2016-10-22 DIAGNOSIS — M48061 Spinal stenosis, lumbar region without neurogenic claudication: Secondary | ICD-10-CM | POA: Diagnosis not present

## 2016-10-22 DIAGNOSIS — Z4789 Encounter for other orthopedic aftercare: Secondary | ICD-10-CM | POA: Diagnosis not present

## 2016-10-23 DIAGNOSIS — M79671 Pain in right foot: Secondary | ICD-10-CM | POA: Diagnosis not present

## 2016-10-23 DIAGNOSIS — Z9889 Other specified postprocedural states: Secondary | ICD-10-CM | POA: Diagnosis not present

## 2016-10-23 DIAGNOSIS — M48062 Spinal stenosis, lumbar region with neurogenic claudication: Secondary | ICD-10-CM | POA: Diagnosis not present

## 2016-10-23 DIAGNOSIS — Z4789 Encounter for other orthopedic aftercare: Secondary | ICD-10-CM | POA: Diagnosis not present

## 2016-10-24 DIAGNOSIS — M6281 Muscle weakness (generalized): Secondary | ICD-10-CM | POA: Diagnosis not present

## 2016-10-24 DIAGNOSIS — R262 Difficulty in walking, not elsewhere classified: Secondary | ICD-10-CM | POA: Diagnosis not present

## 2016-10-24 DIAGNOSIS — R2681 Unsteadiness on feet: Secondary | ICD-10-CM | POA: Diagnosis not present

## 2016-10-24 DIAGNOSIS — M545 Low back pain: Secondary | ICD-10-CM | POA: Diagnosis not present

## 2016-10-24 DIAGNOSIS — M48061 Spinal stenosis, lumbar region without neurogenic claudication: Secondary | ICD-10-CM | POA: Diagnosis not present

## 2016-10-24 DIAGNOSIS — Z4789 Encounter for other orthopedic aftercare: Secondary | ICD-10-CM | POA: Diagnosis not present

## 2016-10-28 DIAGNOSIS — R262 Difficulty in walking, not elsewhere classified: Secondary | ICD-10-CM | POA: Diagnosis not present

## 2016-10-28 DIAGNOSIS — M48061 Spinal stenosis, lumbar region without neurogenic claudication: Secondary | ICD-10-CM | POA: Diagnosis not present

## 2016-10-28 DIAGNOSIS — M545 Low back pain: Secondary | ICD-10-CM | POA: Diagnosis not present

## 2016-10-28 DIAGNOSIS — Z4789 Encounter for other orthopedic aftercare: Secondary | ICD-10-CM | POA: Diagnosis not present

## 2016-10-29 DIAGNOSIS — M48061 Spinal stenosis, lumbar region without neurogenic claudication: Secondary | ICD-10-CM | POA: Diagnosis not present

## 2016-10-29 DIAGNOSIS — R262 Difficulty in walking, not elsewhere classified: Secondary | ICD-10-CM | POA: Diagnosis not present

## 2016-10-29 DIAGNOSIS — Z4789 Encounter for other orthopedic aftercare: Secondary | ICD-10-CM | POA: Diagnosis not present

## 2016-10-29 DIAGNOSIS — M545 Low back pain: Secondary | ICD-10-CM | POA: Diagnosis not present

## 2016-10-31 DIAGNOSIS — R262 Difficulty in walking, not elsewhere classified: Secondary | ICD-10-CM | POA: Diagnosis not present

## 2016-10-31 DIAGNOSIS — M48061 Spinal stenosis, lumbar region without neurogenic claudication: Secondary | ICD-10-CM | POA: Diagnosis not present

## 2016-10-31 DIAGNOSIS — Z4789 Encounter for other orthopedic aftercare: Secondary | ICD-10-CM | POA: Diagnosis not present

## 2016-10-31 DIAGNOSIS — M545 Low back pain: Secondary | ICD-10-CM | POA: Diagnosis not present

## 2016-11-04 DIAGNOSIS — M545 Low back pain: Secondary | ICD-10-CM | POA: Diagnosis not present

## 2016-11-04 DIAGNOSIS — R262 Difficulty in walking, not elsewhere classified: Secondary | ICD-10-CM | POA: Diagnosis not present

## 2016-11-04 DIAGNOSIS — Z4789 Encounter for other orthopedic aftercare: Secondary | ICD-10-CM | POA: Diagnosis not present

## 2016-11-04 DIAGNOSIS — M48061 Spinal stenosis, lumbar region without neurogenic claudication: Secondary | ICD-10-CM | POA: Diagnosis not present

## 2016-11-05 DIAGNOSIS — Z4789 Encounter for other orthopedic aftercare: Secondary | ICD-10-CM | POA: Diagnosis not present

## 2016-11-05 DIAGNOSIS — M545 Low back pain: Secondary | ICD-10-CM | POA: Diagnosis not present

## 2016-11-05 DIAGNOSIS — R262 Difficulty in walking, not elsewhere classified: Secondary | ICD-10-CM | POA: Diagnosis not present

## 2016-11-05 DIAGNOSIS — M48061 Spinal stenosis, lumbar region without neurogenic claudication: Secondary | ICD-10-CM | POA: Diagnosis not present

## 2016-11-07 DIAGNOSIS — R262 Difficulty in walking, not elsewhere classified: Secondary | ICD-10-CM | POA: Diagnosis not present

## 2016-11-07 DIAGNOSIS — Z4789 Encounter for other orthopedic aftercare: Secondary | ICD-10-CM | POA: Diagnosis not present

## 2016-11-07 DIAGNOSIS — M48061 Spinal stenosis, lumbar region without neurogenic claudication: Secondary | ICD-10-CM | POA: Diagnosis not present

## 2016-11-07 DIAGNOSIS — M545 Low back pain: Secondary | ICD-10-CM | POA: Diagnosis not present

## 2016-11-11 DIAGNOSIS — M48061 Spinal stenosis, lumbar region without neurogenic claudication: Secondary | ICD-10-CM | POA: Diagnosis not present

## 2016-11-11 DIAGNOSIS — R262 Difficulty in walking, not elsewhere classified: Secondary | ICD-10-CM | POA: Diagnosis not present

## 2016-11-11 DIAGNOSIS — Z4789 Encounter for other orthopedic aftercare: Secondary | ICD-10-CM | POA: Diagnosis not present

## 2016-11-11 DIAGNOSIS — M545 Low back pain: Secondary | ICD-10-CM | POA: Diagnosis not present

## 2016-11-12 DIAGNOSIS — M545 Low back pain: Secondary | ICD-10-CM | POA: Diagnosis not present

## 2016-11-12 DIAGNOSIS — R262 Difficulty in walking, not elsewhere classified: Secondary | ICD-10-CM | POA: Diagnosis not present

## 2016-11-12 DIAGNOSIS — Z4789 Encounter for other orthopedic aftercare: Secondary | ICD-10-CM | POA: Diagnosis not present

## 2016-11-12 DIAGNOSIS — M48061 Spinal stenosis, lumbar region without neurogenic claudication: Secondary | ICD-10-CM | POA: Diagnosis not present

## 2016-12-02 DIAGNOSIS — M48062 Spinal stenosis, lumbar region with neurogenic claudication: Secondary | ICD-10-CM | POA: Diagnosis not present

## 2016-12-02 DIAGNOSIS — Z9889 Other specified postprocedural states: Secondary | ICD-10-CM | POA: Diagnosis not present

## 2016-12-02 DIAGNOSIS — Z4789 Encounter for other orthopedic aftercare: Secondary | ICD-10-CM | POA: Diagnosis not present

## 2016-12-27 NOTE — Progress Notes (Signed)
Subjective:   Jennifer Wilkins is a 83 y.o. female who presents for Medicare Annual (Subsequent) preventive examination.  Review of Systems:  No ROS.  Medicare Wellness Visit. Cardiac Risk Factors include: advanced age (>75men, >15 women);hypertension Sleep patterns:  Wakes at least once in middle of night. Feels rested.  Home Safety/Smoke Alarms:   Living environment; residence and Firearm Safety: Assisted living. Seat Belt Safety/Bike Helmet: Wears seat belt.   Counseling:   Eye Exam- Wears reading glasses. Dr.Miller as needed.  Dental- pt does not have a dentist. Importance discussed.   Female:   Pap- No longer doing routine screening due to age.      Mammo- Last 01/07/12: BI-RADS Category 1-Negative      Dexa scan-  Last 04/28/14: osteoporosis.      CCS-no longer screening due to age.    Objective:     Vitals: BP 122/80 (BP Location: Right Arm, Patient Position: Sitting, Cuff Size: Normal)   Pulse 87   Temp 97.8 F (36.6 C) (Oral)   Resp 18   Wt 111 lb 3.2 oz (50.4 kg)   SpO2 98%   BMI 24.93 kg/m   Body mass index is 24.93 kg/m.   Tobacco History  Smoking Status  . Never Smoker  Smokeless Tobacco  . Never Used     Counseling given: Not Answered   Past Medical History:  Diagnosis Date  . Anemia 09/30/2016  . Arthritis   . Elevated BP 04/21/2014  . HTN (hypertension)   . Hypercholesteremia   . Hypocalcemia 06/19/2013  . Low back pain radiating to both legs 08/27/2015  . Medicare annual wellness visit, subsequent 05/03/2015  . Preventative health care 06/02/2016  . Stress reaction 04/25/2015   Past Surgical History:  Procedure Laterality Date  . CHOLECYSTECTOMY    . GALLBLADDER SURGERY  1984   Gallstone removal  . LUMBAR LAMINECTOMY/DECOMPRESSION MICRODISCECTOMY N/A 09/11/2016   Procedure: Microlumbar Decompression L3-4 and L4-5, lateral mass fusion at L4-5 using autologous, autograft bone;  Surgeon: Susa Day, MD;  Location: [facility];  Service: Orthopedics;   Laterality: N/A;   Family History  Problem Relation Age of Onset  . Osteoporosis Mother   . Ulcers Father   . Kidney disease Paternal Grandfather    History  Sexual Activity  . Sexual activity: Not Currently    Outpatient Encounter Prescriptions as of 12/30/2016  Medication Sig  . aspirin 81 MG tablet Take 1 tablet (81 mg total) by mouth daily. Resume 4 days post-op  . Calcium Carbonate-Vitamin D (CALCIUM 600/VITAMIN D) 600-400 MG-UNIT per tablet Take 1 tablet by mouth 2 (two) times daily.  . carvedilol (COREG) 12.5 MG tablet Take 1 tablet (12.5 mg total) by mouth daily.  . cholecalciferol (VITAMIN D) 1000 units tablet Take 1,000 Units by mouth daily.  . Cyanocobalamin (VITAMIN B-12 CR) 1500 MCG TBCR Take by mouth daily.  Marland Kitchen FIBER ADULT GUMMIES PO Take 2 each by mouth.  . hydrochlorothiazide (MICROZIDE) 12.5 MG capsule Take 1 capsule (12.5 mg total) by mouth daily.  . Misc Natural Products (OSTEO BI-FLEX ADV JOINT SHIELD) TABS Take 1 tablet by mouth daily. Resume 5 days post-op  . Multiple Vitamins-Minerals (HAIR/SKIN/NAILS/BIOTIN) TABS Take 1 tablet by mouth daily. Resume 5 days post-op  . Multiple Vitamins-Minerals (MULTIVITAMIN WITH MINERALS) tablet Take 1 tablet by mouth daily. Resume 5 days post-op  . polyethylene glycol (MIRALAX / GLYCOLAX) packet Take 17 g by mouth daily.  . Probiotic Product (PROBIOTIC DAILY PO) Take by mouth.  Marland Kitchen  Turmeric 500 MG CAPS Take 1 capsule by mouth daily. Resume 5 days post-op as needed  . vitamin E 400 UNIT capsule Take 1 capsule (400 Units total) by mouth daily. Resume 5 days post-op  . [DISCONTINUED] carvedilol (COREG) 12.5 MG tablet TAKE 1 TABLET DAILY  . [DISCONTINUED] docusate sodium (COLACE) 100 MG capsule Take 1 capsule (100 mg total) by mouth 2 (two) times daily as needed for mild constipation.  . [DISCONTINUED] oxyCODONE-acetaminophen (PERCOCET/ROXICET) 5-325 MG tablet Take 1-2 tablets by mouth every 6 (six) hours as needed for moderate pain.    . [DISCONTINUED] denosumab (PROLIA) injection 60 mg    No facility-administered encounter medications on file as of 12/30/2016.     Activities of Daily Living In your present state of health, do you have any difficulty performing the following activities: 12/30/2016 09/11/2016  Hearing? Y N  Vision? N N  Difficulty concentrating or making decisions? N N  Walking or climbing stairs? Y Y  Dressing or bathing? N N  Doing errands, shopping? Y -  Conservation officer, nature and eating ? N -  Using the Toilet? N -  In the past six months, have you accidently leaked urine? N -  Do you have problems with loss of bowel control? N -  Managing your Medications? Y -  Managing your Finances? Y -  Housekeeping or managing your Housekeeping? Y -  Some recent data might be hidden    Patient Care Team: Mosie Lukes, MD as PCP - General (Family Medicine) Marica Otter, OD (Optometry)    Assessment:    Physical assessment deferred to PCP.  Exercise Activities and Dietary recommendations Current Exercise Habits: The patient does not participate in regular exercise at present Diet (meal preparation, eat out, water intake, caffeinated beverages, dairy products, fruits and vegetables): well balanced      Goals    . Maintain current lifestyle.      Fall Risk Fall Risk  12/30/2016 05/28/2016 04/25/2015 10/14/2014  Falls in the past year? No No No No   Depression Screen PHQ 2/9 Scores 12/30/2016 05/28/2016 04/25/2015 10/14/2014  PHQ - 2 Score 0 4 1 0  PHQ- 9 Score - 4 - -     Cognitive Function MMSE - Mini Mental State Exam 12/30/2016  Orientation to time 5  Orientation to Place 5  Registration 3  Attention/ Calculation 5  Recall 3  Language- name 2 objects 2  Language- repeat 1  Language- follow 3 step command 3  Language- read & follow direction 1  Write a sentence 1  Copy design 1  Total score 30        Immunization History  Administered Date(s) Administered  . Influenza Split 04/24/2012  .  Influenza, High Dose Seasonal PF 04/25/2015  . Influenza,inj,Quad PF,36+ Mos 04/21/2014  . Pneumococcal Conjugate-13 06/14/2013, 10/14/2014  . Pneumococcal Polysaccharide-23 05/30/2016  . Tdap 06/14/2013  . Zoster 07/29/2014   Screening Tests Health Maintenance  Topic Date Due  . INFLUENZA VACCINE  02/26/2017  . TETANUS/TDAP  06/15/2023  . DEXA SCAN  Completed  . PNA vac Low Risk Adult  Completed      Plan:   Follow up with PCP as directed.  Continue to eat heart healthy diet (full of fruits, vegetables, whole grains, lean protein, water--limit salt, fat, and sugar intake) and increase physical activity as tolerated.  Continue doing brain stimulating activities (puzzles, reading, adult coloring books, staying active) to keep memory sharp.     I have personally reviewed  and noted the following in the patient's chart:   . Medical and social history . Use of alcohol, tobacco or illicit drugs  . Current medications and supplements . Functional ability and status . Nutritional status . Physical activity . Advanced directives . List of other physicians . Hospitalizations, surgeries, and ER visits in previous 12 months . Vitals . Screenings to include cognitive, depression, and falls . Referrals and appointments  In addition, I have reviewed and discussed with patient certain preventive protocols, quality metrics, and best practice recommendations. A written personalized care plan for preventive services as well as general preventive health recommendations were provided to patient.     Shela Nevin, [place]  12/30/2016

## 2016-12-30 ENCOUNTER — Ambulatory Visit (INDEPENDENT_AMBULATORY_CARE_PROVIDER_SITE_OTHER): Payer: Medicare Other | Admitting: Family Medicine

## 2016-12-30 ENCOUNTER — Telehealth: Payer: Self-pay

## 2016-12-30 ENCOUNTER — Encounter: Payer: Self-pay | Admitting: Family Medicine

## 2016-12-30 VITALS — BP 122/80 | HR 87 | Temp 97.8°F | Resp 18 | Wt 111.2 lb

## 2016-12-30 DIAGNOSIS — D75839 Thrombocytosis, unspecified: Secondary | ICD-10-CM | POA: Insufficient documentation

## 2016-12-30 DIAGNOSIS — E559 Vitamin D deficiency, unspecified: Secondary | ICD-10-CM

## 2016-12-30 DIAGNOSIS — M810 Age-related osteoporosis without current pathological fracture: Secondary | ICD-10-CM

## 2016-12-30 DIAGNOSIS — Z0000 Encounter for general adult medical examination without abnormal findings: Secondary | ICD-10-CM | POA: Diagnosis not present

## 2016-12-30 DIAGNOSIS — I10 Essential (primary) hypertension: Secondary | ICD-10-CM

## 2016-12-30 DIAGNOSIS — M79604 Pain in right leg: Secondary | ICD-10-CM

## 2016-12-30 DIAGNOSIS — D473 Essential (hemorrhagic) thrombocythemia: Secondary | ICD-10-CM | POA: Diagnosis not present

## 2016-12-30 DIAGNOSIS — M545 Low back pain, unspecified: Secondary | ICD-10-CM

## 2016-12-30 DIAGNOSIS — E782 Mixed hyperlipidemia: Secondary | ICD-10-CM | POA: Diagnosis not present

## 2016-12-30 DIAGNOSIS — D509 Iron deficiency anemia, unspecified: Secondary | ICD-10-CM | POA: Diagnosis not present

## 2016-12-30 HISTORY — DX: Thrombocytosis, unspecified: D75.839

## 2016-12-30 LAB — COMPREHENSIVE METABOLIC PANEL
ALBUMIN: 4 g/dL (ref 3.5–5.2)
ALK PHOS: 63 U/L (ref 39–117)
ALT: 15 U/L (ref 0–35)
AST: 19 U/L (ref 0–37)
BILIRUBIN TOTAL: 0.5 mg/dL (ref 0.2–1.2)
BUN: 19 mg/dL (ref 6–23)
CALCIUM: 9.3 mg/dL (ref 8.4–10.5)
CO2: 30 mEq/L (ref 19–32)
Chloride: 103 mEq/L (ref 96–112)
Creatinine, Ser: 0.76 mg/dL (ref 0.40–1.20)
GFR: 77.17 mL/min (ref >60.00)
Glucose, Bld: 95 mg/dL (ref 70–99)
Potassium: 3.7 mEq/L (ref 3.5–5.1)
Sodium: 140 mEq/L (ref 135–145)
TOTAL PROTEIN: 6.9 g/dL (ref 6.0–8.3)

## 2016-12-30 LAB — CBC
HCT: 40.3 % (ref 36.0–46.0)
HEMOGLOBIN: 13.3 g/dL (ref 12.0–15.0)
MCHC: 33 g/dL (ref 30.0–36.0)
MCV: 86.2 fl (ref 78.0–100.0)
PLATELETS: 226 10*3/uL (ref 150.0–400.0)
RBC: 4.68 Mil/uL (ref 3.87–5.11)
RDW: 14.5 % (ref 11.5–15.5)
WBC: 5.6 10*3/uL (ref 4.0–10.5)

## 2016-12-30 LAB — VITAMIN D 25 HYDROXY (VIT D DEFICIENCY, FRACTURES): VITD: 97.83 ng/mL (ref 30.00–100.00)

## 2016-12-30 MED ORDER — DENOSUMAB 60 MG/ML ~~LOC~~ SOLN
60.0000 mg | Freq: Once | SUBCUTANEOUS | Status: DC
Start: 2016-12-30 — End: 2016-12-30

## 2016-12-30 MED ORDER — CARVEDILOL 12.5 MG PO TABS: 12.5000 mg | ORAL_TABLET | Freq: Every day | ORAL | 0 refills | Status: DC

## 2016-12-30 NOTE — Patient Instructions (Addendum)
Hypertension Hypertension is another name for high blood pressure. High blood pressure forces your heart to work harder to pump blood. This can cause problems over time. There are two numbers in a blood pressure reading. There is a top number (systolic) over a bottom number (diastolic). It is best to have a blood pressure below 120/80. Healthy choices can help lower your blood pressure. You may need medicine to help lower your blood pressure if:  Your blood pressure cannot be lowered with healthy choices.  Your blood pressure is higher than 130/80.  Follow these instructions at home: Eating and drinking  If directed, follow the DASH eating plan. This diet includes: ? Filling half of your plate at each meal with fruits and vegetables. ? Filling one quarter of your plate at each meal with whole grains. Whole grains include whole wheat pasta, brown rice, and whole grain bread. ? Eating or drinking low-fat dairy products, such as skim milk or low-fat yogurt. ? Filling one quarter of your plate at each meal with low-fat (lean) proteins. Low-fat proteins include fish, skinless chicken, eggs, beans, and tofu. ? Avoiding fatty meat, cured and processed meat, or chicken with skin. ? Avoiding premade or processed food.  Eat less than 1,500 mg of salt (sodium) a day.  Limit alcohol use to no more than 1 drink a day for nonpregnant women and 2 drinks a day for men. One drink equals 12 oz of beer, 5 oz of wine, or 1 oz of hard liquor. Lifestyle  Work with your doctor to stay at a healthy weight or to lose weight. Ask your doctor what the best weight is for you.  Get at least 30 minutes of exercise that causes your heart to beat faster (aerobic exercise) most days of the week. This may include walking, swimming, or biking.  Get at least 30 minutes of exercise that strengthens your muscles (resistance exercise) at least 3 days a week. This may include lifting weights or pilates.  Do not use any  products that contain nicotine or tobacco. This includes cigarettes and e-cigarettes. If you need help quitting, ask your doctor.  Check your blood pressure at home as told by your doctor.  Keep all follow-up visits as told by your doctor. This is important. Medicines  Take over-the-counter and prescription medicines only as told by your doctor. Follow directions carefully.  Do not skip doses of blood pressure medicine. The medicine does not work as well if you skip doses. Skipping doses also puts you at risk for problems.  Ask your doctor about side effects or reactions to medicines that you should watch for. Contact a doctor if:  You think you are having a reaction to the medicine you are taking.  You have headaches that keep coming back (recurring).  You feel dizzy.  You have swelling in your ankles.  You have trouble with your vision. Get help right away if:  You get a very bad headache.  You start to feel confused.  You feel weak or numb.  You feel faint.  You get very bad pain in your: ? Chest. ? Belly (abdomen).  You throw up (vomit) more than once.  You have trouble breathing. Summary  Hypertension is another name for high blood pressure.  Making healthy choices can help lower blood pressure. If your blood pressure cannot be controlled with healthy choices, you may need to take medicine. This information is not intended to replace advice given to you by your health care   provider. Make sure you discuss any questions you have with your health care provider. Document Released: 01/01/2008 Document Revised: 06/12/2016 Document Reviewed: 06/12/2016 Elsevier Interactive Patient Education  2018 [employer]. Blanda , Thank you for taking time to come for your Medicare Wellness Visit. I appreciate your ongoing commitment to your health goals. Please review the following plan we discussed and let me know if I can assist you in the future.   These are the goals  we discussed: Goals    . Maintain current lifestyle.       This is a list of the screening recommended for you and due dates:  Health Maintenance  Topic Date Due  . Flu Shot  02/26/2017  . Tetanus Vaccine  06/15/2023  . DEXA scan (bone density measurement)  Completed  . Pneumonia vaccines  Completed   Continue to eat heart healthy diet (full of fruits, vegetables, whole grains, lean protein, water--limit salt, fat, and sugar intake) and increase physical activity as tolerated.  Continue doing brain stimulating activities (puzzles, reading, adult coloring books, staying active) to keep memory sharp.

## 2016-12-30 NOTE — Progress Notes (Signed)
Subjective:  I acted as a Education administrator for Dr. Charlett Blake. [place], [place]   Patient ID: Jennifer Wilkins, female    DOB: [birth year], 83 y.o.   MRN: [id number]  Chief Complaint  Patient presents with  . Follow-up    HPI  Patient is in today for a 3 month follow up. She is following up on her HTn, Diabetes, hyperlipidemia and other medical concerns. She feels well today and is accompanied by her daughter. She is using Tylenol prn for her pain management with good results most days. Her back is better since her surgery. Denies CP/palp/SOB/HA/congestion/fevers/GI or GU c/o. Taking meds as prescribed Patient Care Team: Mosie Lukes, MD as PCP - General (Family Medicine) Marica Otter, OD (Optometry)   Past Medical History:  Diagnosis Date  . Anemia 09/30/2016  . Arthritis   . Elevated BP 04/21/2014  . HTN (hypertension)   . Hypercholesteremia   . Hypocalcemia 06/19/2013  . Low back pain radiating to both legs 08/27/2015  . Medicare annual wellness visit, subsequent 05/03/2015  . Preventative health care 06/02/2016  . Stress reaction 04/25/2015    Past Surgical History:  Procedure Laterality Date  . CHOLECYSTECTOMY    . GALLBLADDER SURGERY  1984   Gallstone removal  . LUMBAR LAMINECTOMY/DECOMPRESSION MICRODISCECTOMY N/A 09/11/2016   Procedure: Microlumbar Decompression L3-4 and L4-5, lateral mass fusion at L4-5 using autologous, autograft bone;  Surgeon: Susa Day, MD;  Location: [facility];  Service: Orthopedics;  Laterality: N/A;    Family History  Problem Relation Age of Onset  . Osteoporosis Mother   . Ulcers Father   . Kidney disease Paternal Grandfather     Social History   Social History  . Marital status: Married    Spouse name: N/A  . Number of children: N/A  . Years of education: N/A   Occupational History  . Not on file.   Social History Main Topics  . Smoking status: Never Smoker  . Smokeless tobacco: Never Used  . Alcohol use No  . Drug use: No  . Sexual activity: Not  Currently   Other Topics Concern  . Not on file   Social History Narrative   Widowed July 2017    Outpatient Medications Prior to Visit  Medication Sig Dispense Refill  . aspirin 81 MG tablet Take 1 tablet (81 mg total) by mouth daily. Resume 4 days post-op 30 tablet   . Calcium Carbonate-Vitamin D (CALCIUM 600/VITAMIN D) 600-400 MG-UNIT per tablet Take 1 tablet by mouth 2 (two) times daily.    . cholecalciferol (VITAMIN D) 1000 units tablet Take 1,000 Units by mouth daily.    . Cyanocobalamin (VITAMIN B-12 CR) 1500 MCG TBCR Take by mouth daily.    Marland Kitchen FIBER ADULT GUMMIES PO Take 2 each by mouth.    . hydrochlorothiazide (MICROZIDE) 12.5 MG capsule Take 1 capsule (12.5 mg total) by mouth daily. 90 capsule 1  . Misc Natural Products (OSTEO BI-FLEX ADV JOINT SHIELD) TABS Take 1 tablet by mouth daily. Resume 5 days post-op    . Multiple Vitamins-Minerals (HAIR/SKIN/NAILS/BIOTIN) TABS Take 1 tablet by mouth daily. Resume 5 days post-op    . Multiple Vitamins-Minerals (MULTIVITAMIN WITH MINERALS) tablet Take 1 tablet by mouth daily. Resume 5 days post-op    . polyethylene glycol (MIRALAX / GLYCOLAX) packet Take 17 g by mouth daily. 14 each 0  . Probiotic Product (PROBIOTIC DAILY PO) Take by mouth.    . Turmeric 500 MG CAPS Take 1 capsule by mouth  daily. Resume 5 days post-op as needed    . vitamin E 400 UNIT capsule Take 1 capsule (400 Units total) by mouth daily. Resume 5 days post-op    . carvedilol (COREG) 12.5 MG tablet TAKE 1 TABLET DAILY 90 tablet 0  . docusate sodium (COLACE) 100 MG capsule Take 1 capsule (100 mg total) by mouth 2 (two) times daily as needed for mild constipation. 30 capsule 1  . oxyCODONE-acetaminophen (PERCOCET/ROXICET) 5-325 MG tablet Take 1-2 tablets by mouth every 6 (six) hours as needed for moderate pain. 50 tablet 0   No facility-administered medications prior to visit.     No Known Allergies  Review of Systems  Constitutional: Negative for fever and  malaise/fatigue.  HENT: Negative for congestion.   Eyes: Negative for blurred vision.  Respiratory: Negative for shortness of breath.   Cardiovascular: Negative for chest pain, palpitations and leg swelling.  Gastrointestinal: Negative for abdominal pain, blood in stool and nausea.  Genitourinary: Negative for dysuria and frequency.  Musculoskeletal: Positive for back pain. Negative for falls.  Skin: Negative for rash.  Neurological: Negative for dizziness, loss of consciousness and headaches.  Endo/Heme/Allergies: Negative for environmental allergies.  Psychiatric/Behavioral: Negative for depression. The patient is not nervous/anxious.        Objective:    Physical Exam  Constitutional: She is oriented to person, place, and time. She appears well-developed and well-nourished. No distress.  HENT:  Head: Normocephalic and atraumatic.  Nose: Nose normal.  Eyes: Right eye exhibits no discharge. Left eye exhibits no discharge.  Neck: Normal range of motion. Neck supple.  Cardiovascular: Normal rate and regular rhythm.   Pulmonary/Chest: Effort normal and breath sounds normal.  Abdominal: Soft. Bowel sounds are normal. There is no tenderness.  Musculoskeletal: She exhibits no edema.  Neurological: She is alert and oriented to person, place, and time.  Skin: Skin is warm and dry.  Psychiatric: She has a normal mood and affect.  Nursing note and vitals reviewed.   BP 122/80 (BP Location: Right Arm, Patient Position: Sitting, Cuff Size: Normal)   Pulse 87   Temp 97.8 F (36.6 C) (Oral)   Resp 18   Wt 111 lb 3.2 oz (50.4 kg)   SpO2 98%   BMI 24.93 kg/m  Wt Readings from Last 3 Encounters:  12/30/16 111 lb 3.2 oz (50.4 kg)  09/30/16 107 lb 3.2 oz (48.6 kg)  09/11/16 109 lb (49.4 kg)   BP Readings from Last 3 Encounters:  12/30/16 122/80  09/30/16 118/72  09/13/16 119/62     Immunization History  Administered Date(s) Administered  . Influenza Split 04/24/2012  .  Influenza, High Dose Seasonal PF 04/25/2015  . Influenza,inj,Quad PF,36+ Mos 04/21/2014  . Pneumococcal Conjugate-13 06/14/2013, 10/14/2014  . Pneumococcal Polysaccharide-23 05/30/2016  . Tdap 06/14/2013  . Zoster 07/29/2014    Health Maintenance  Topic Date Due  . INFLUENZA VACCINE  02/26/2017  . TETANUS/TDAP  06/15/2023  . DEXA SCAN  Completed  . PNA vac Low Risk Adult  Completed    Lab Results  Component Value Date   WBC 5.6 12/30/2016   HGB 13.3 12/30/2016   HCT 40.3 12/30/2016   PLT 226.0 12/30/2016   GLUCOSE 95 12/30/2016   CHOL 189 05/28/2016   TRIG 83.0 05/28/2016   HDL 94.10 05/28/2016   LDLCALC 78 05/28/2016   ALT 15 12/30/2016   AST 19 12/30/2016   NA 140 12/30/2016   K 3.7 12/30/2016   CL 103 12/30/2016   CREATININE  0.76 12/30/2016   BUN 19 12/30/2016   CO2 30 12/30/2016   TSH 1.81 05/28/2016    Lab Results  Component Value Date   TSH 1.81 05/28/2016   Lab Results  Component Value Date   WBC 5.6 12/30/2016   HGB 13.3 12/30/2016   HCT 40.3 12/30/2016   MCV 86.2 12/30/2016   PLT 226.0 12/30/2016   Lab Results  Component Value Date   NA 140 12/30/2016   K 3.7 12/30/2016   CO2 30 12/30/2016   GLUCOSE 95 12/30/2016   BUN 19 12/30/2016   CREATININE 0.76 12/30/2016   BILITOT 0.5 12/30/2016   ALKPHOS 63 12/30/2016   AST 19 12/30/2016   ALT 15 12/30/2016   PROT 6.9 12/30/2016   ALBUMIN 4.0 12/30/2016   CALCIUM 9.3 12/30/2016   ANIONGAP 7 09/12/2016   GFR 77.17 12/30/2016   Lab Results  Component Value Date   CHOL 189 05/28/2016   Lab Results  Component Value Date   HDL 94.10 05/28/2016   Lab Results  Component Value Date   LDLCALC 78 05/28/2016   Lab Results  Component Value Date   TRIG 83.0 05/28/2016   Lab Results  Component Value Date   CHOLHDL 2 05/28/2016   No results found for: HGBA1C       Assessment & Plan:   Problem List Items Addressed This Visit    Osteoporosis    Check Vitamin D and CMP and proceed with  Prolia PA      Relevant Orders   VITAMIN D 25 Hydroxy (Vit-D Deficiency, Fractures) (Completed)   VITAMIN D 25 Hydroxy (Vit-D Deficiency, Fractures)   Hyperlipidemia, mixed    Encouraged heart healthy diet, increase exercise, avoid trans fats, consider a krill oil cap daily      Relevant Medications   carvedilol (COREG) 12.5 MG tablet   Other Relevant Orders   Lipid panel   Vitamin D deficiency   Relevant Orders   VITAMIN D 25 Hydroxy (Vit-D Deficiency, Fractures) (Completed)   VITAMIN D 25 Hydroxy (Vit-D Deficiency, Fractures)   Essential hypertension - Primary    Well controlled, no changes to meds. Encouraged heart healthy diet such as the DASH diet and exercise as tolerated.       Relevant Medications   carvedilol (COREG) 12.5 MG tablet   Other Relevant Orders   CBC (Completed)   Comprehensive metabolic panel (Completed)   CBC   Comprehensive metabolic panel   TSH   Hypocalcemia   Relevant Orders   VITAMIN D 25 Hydroxy (Vit-D Deficiency, Fractures)   Low back pain radiating to both legs    Much better since her surgery no difficulty with ADLs at home. Using Tylenol at home for pain prn with adequate results.       Preventative health care   Relevant Orders   CBC   Comprehensive metabolic panel   Lipid panel   TSH   VITAMIN D 25 Hydroxy (Vit-D Deficiency, Fractures)   Anemia   Thrombocytosis ([place])   Relevant Orders   CBC    Other Visit Diagnoses    Encounter for Medicare annual wellness exam          I have discontinued Ms. Birden's docusate sodium and oxyCODONE-acetaminophen. I have also changed her carvedilol. Additionally, I am having her maintain her Vitamin B-12 CR, Calcium Carbonate-Vitamin D, Probiotic Product (PROBIOTIC DAILY PO), FIBER ADULT GUMMIES PO, cholecalciferol, polyethylene glycol, aspirin, multivitamin with minerals, HAIR/SKIN/NAILS/BIOTIN, OSTEO BI-FLEX ADV JOINT SHIELD, Turmeric, vitamin E, and  hydrochlorothiazide.  Meds ordered this  encounter  Medications  . carvedilol (COREG) 12.5 MG tablet    Sig: Take 1 tablet (12.5 mg total) by mouth daily.    Dispense:  90 tablet    Refill:  0  . DISCONTD: denosumab (PROLIA) injection 60 mg    CMA served as scribe during this visit. History, Physical and Plan performed by medical provider. Documentation and orders reviewed and attested to.  Penni Homans, MD

## 2016-12-30 NOTE — Telephone Encounter (Signed)
Patient needs PA approval for Prolia. Patient already scheduled for injection for next week.  Prolia already in the refrigerator.

## 2016-12-31 NOTE — Assessment & Plan Note (Signed)
Well controlled, no changes to meds. Encouraged heart healthy diet such as the DASH diet and exercise as tolerated.  °

## 2016-12-31 NOTE — Assessment & Plan Note (Signed)
Check Vitamin D and CMP and proceed with Prolia PA

## 2016-12-31 NOTE — Assessment & Plan Note (Signed)
Encouraged heart healthy diet, increase exercise, avoid trans fats, consider a krill oil cap daily 

## 2016-12-31 NOTE — Assessment & Plan Note (Signed)
Much better since her surgery no difficulty with ADLs at home. Using Tylenol at home for pain prn with adequate results.

## 2017-01-02 NOTE — Telephone Encounter (Signed)
Benefits verified NO PA required $183 ded met 20% co-insurance  Patient may owe approximately $0 OOP  Due after 12/31/16-Scheduled for 01/07/17

## 2017-01-07 ENCOUNTER — Ambulatory Visit (INDEPENDENT_AMBULATORY_CARE_PROVIDER_SITE_OTHER): Payer: Medicare Other | Admitting: Behavioral Health

## 2017-01-07 DIAGNOSIS — M810 Age-related osteoporosis without current pathological fracture: Secondary | ICD-10-CM

## 2017-01-07 MED ORDER — DENOSUMAB 60 MG/ML ~~LOC~~ SOLN
60.0000 mg | Freq: Once | SUBCUTANEOUS | Status: AC
  Administered 2017-01-07: 60 mg via SUBCUTANEOUS

## 2017-01-07 NOTE — Progress Notes (Signed)
Pre visit review using our clinic review tool, if applicable. No additional management support is needed unless otherwise documented below in the visit note.  Patient came in clinic for Prolia injection. SQ injection given in the left arm. Patient tolerated it well. No signs or symptoms of a reaction before leaving the visit.

## 2017-01-07 NOTE — Progress Notes (Signed)
RN note reviewed. Agree with documention and plan. 

## 2017-03-12 DIAGNOSIS — M545 Low back pain: Secondary | ICD-10-CM | POA: Diagnosis not present

## 2017-03-12 DIAGNOSIS — M48062 Spinal stenosis, lumbar region with neurogenic claudication: Secondary | ICD-10-CM | POA: Diagnosis not present

## 2017-03-19 IMAGING — DX DG LUMBAR SPINE 2-3V
2 series · 2 of 2 positions shown · non-contrast
Comparison: None.

CLINICAL DATA: Lumbar disc disease.  Spondylolisthesis.

EXAM:
LUMBAR SPINE - 2-3 VIEW

[l-spine ap]
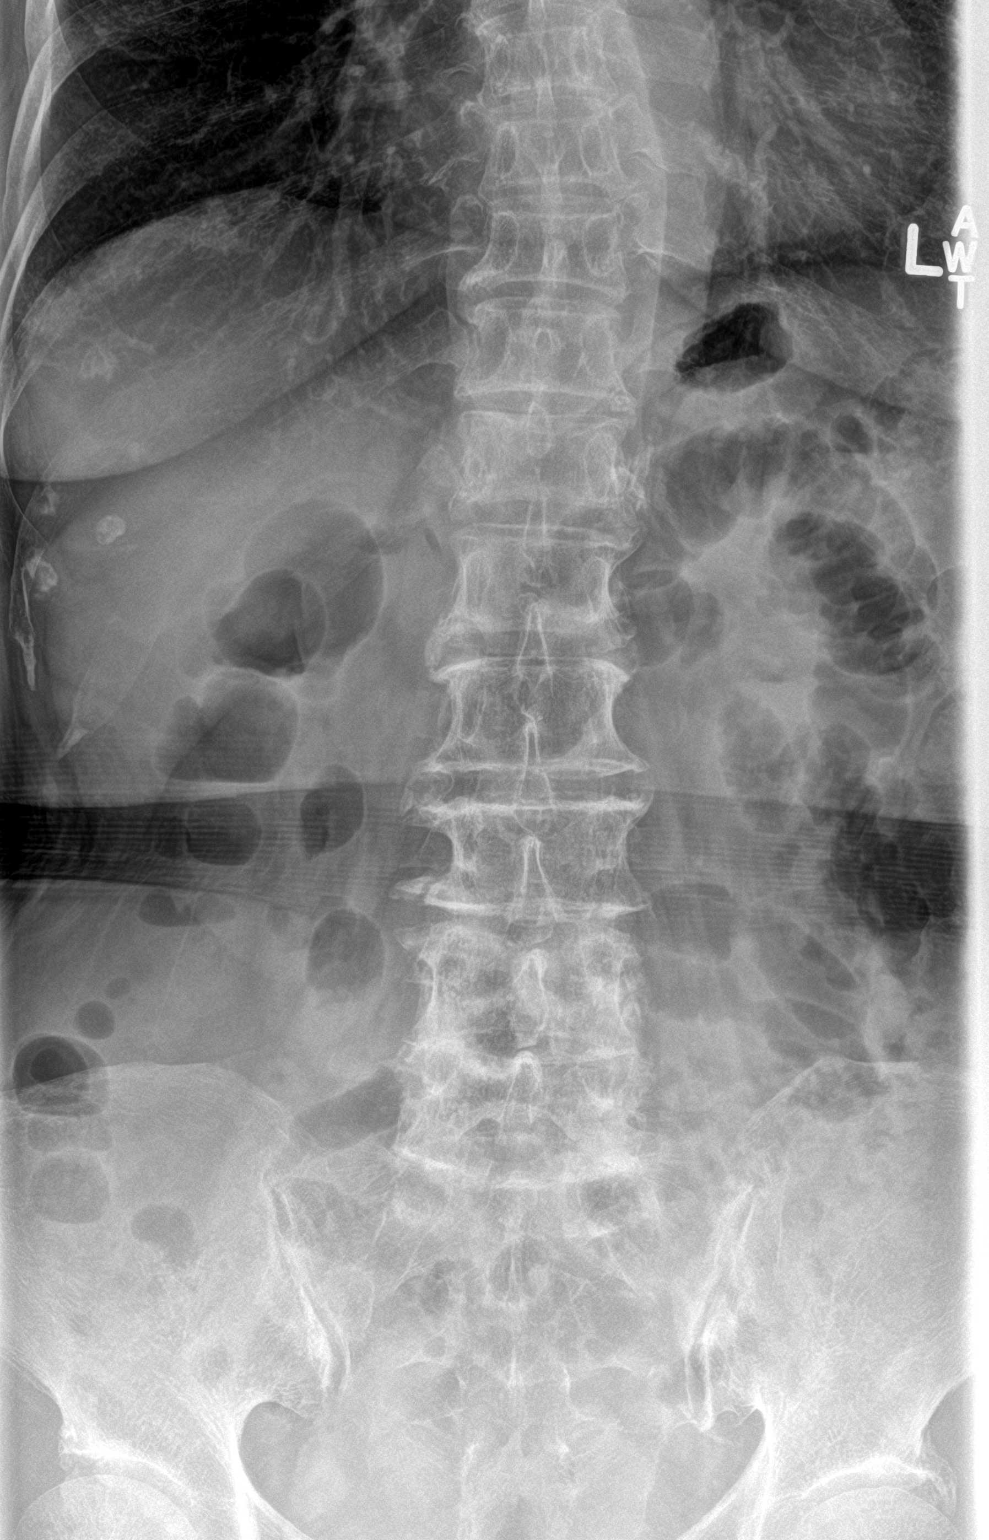

[l-spine lat]
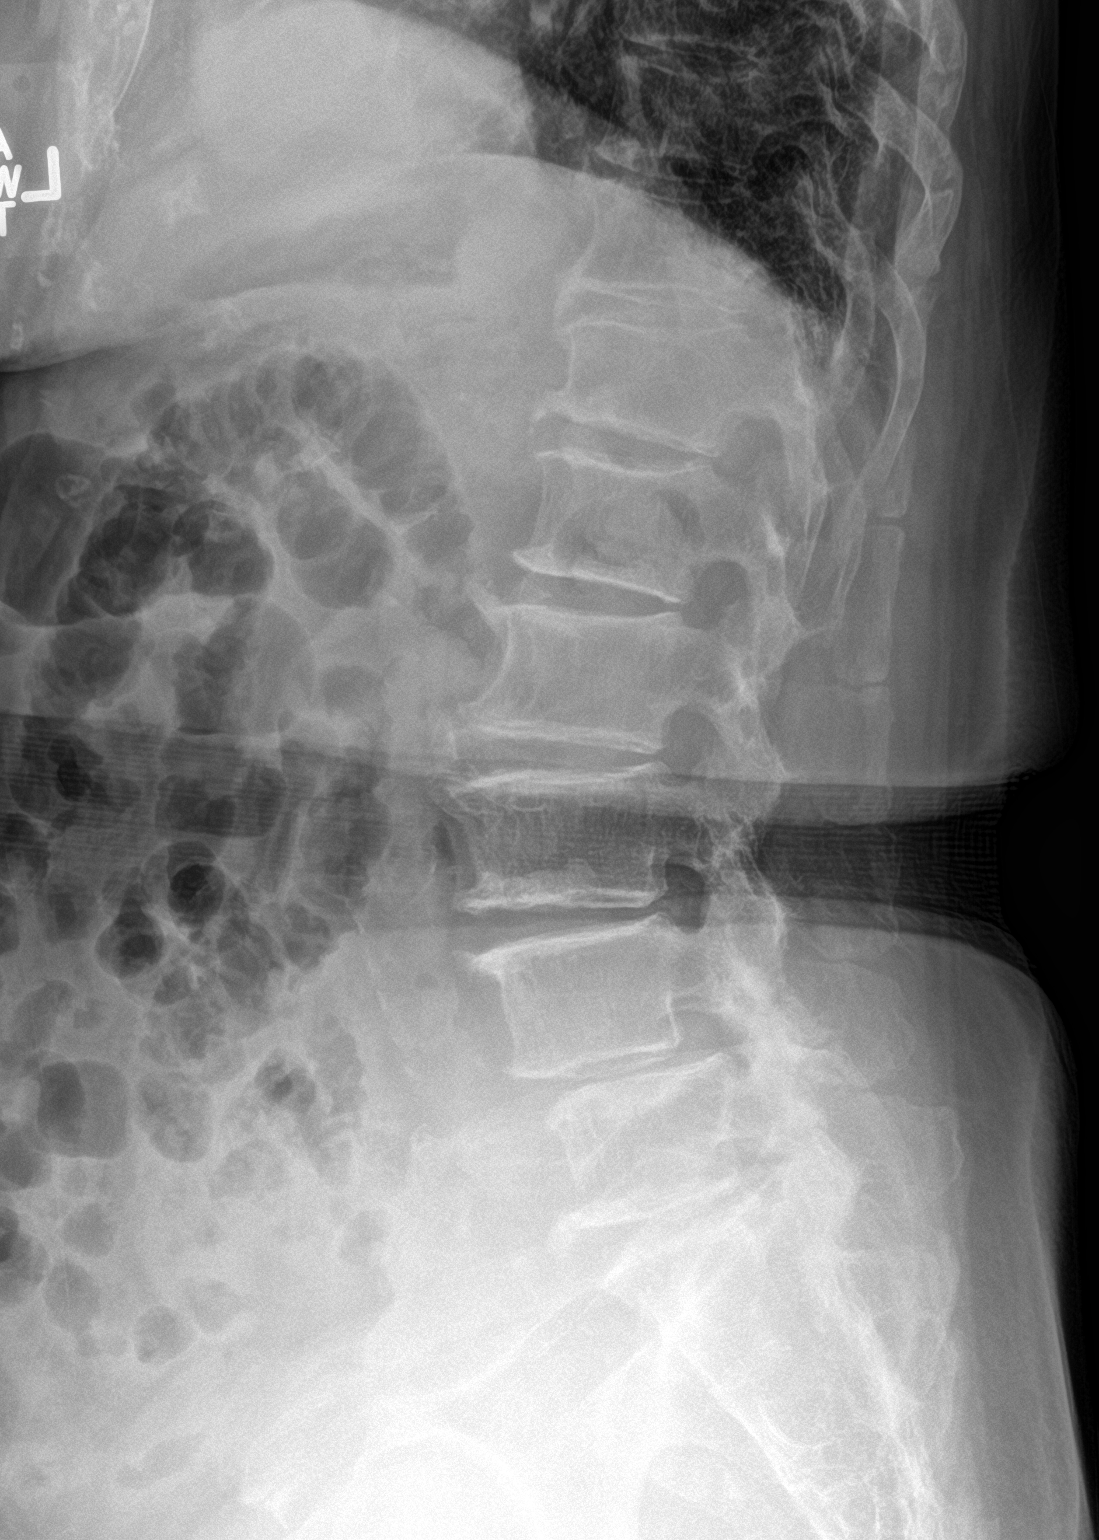

[2 of 2 positions shown; findings below may reference images not displayed]

FINDINGS: There 5 typical lumbar segments. There is grade 1-2
spondylolisthesis of L4 on L5 with bilateral facet arthritis at
L4-5. There is disc space narrowing throughout the lumbar spine. No
fractures or bone destruction. Slight chronic wedge deformity of
T12.
IMPRESSION: Multilevel degenerative disc disease with grade 1-2
spondylolisthesis of L4 on L5 secondary to facet arthritis.

## 2017-03-27 IMAGING — DX DG SPINE 1V PORT
1 series · 1 of 1 positions shown · non-contrast
Comparison: 09/03/2016

CLINICAL DATA: Surgical level L4-5.

EXAM:
PORTABLE SPINE - 1 VIEW

[l-spine lat]
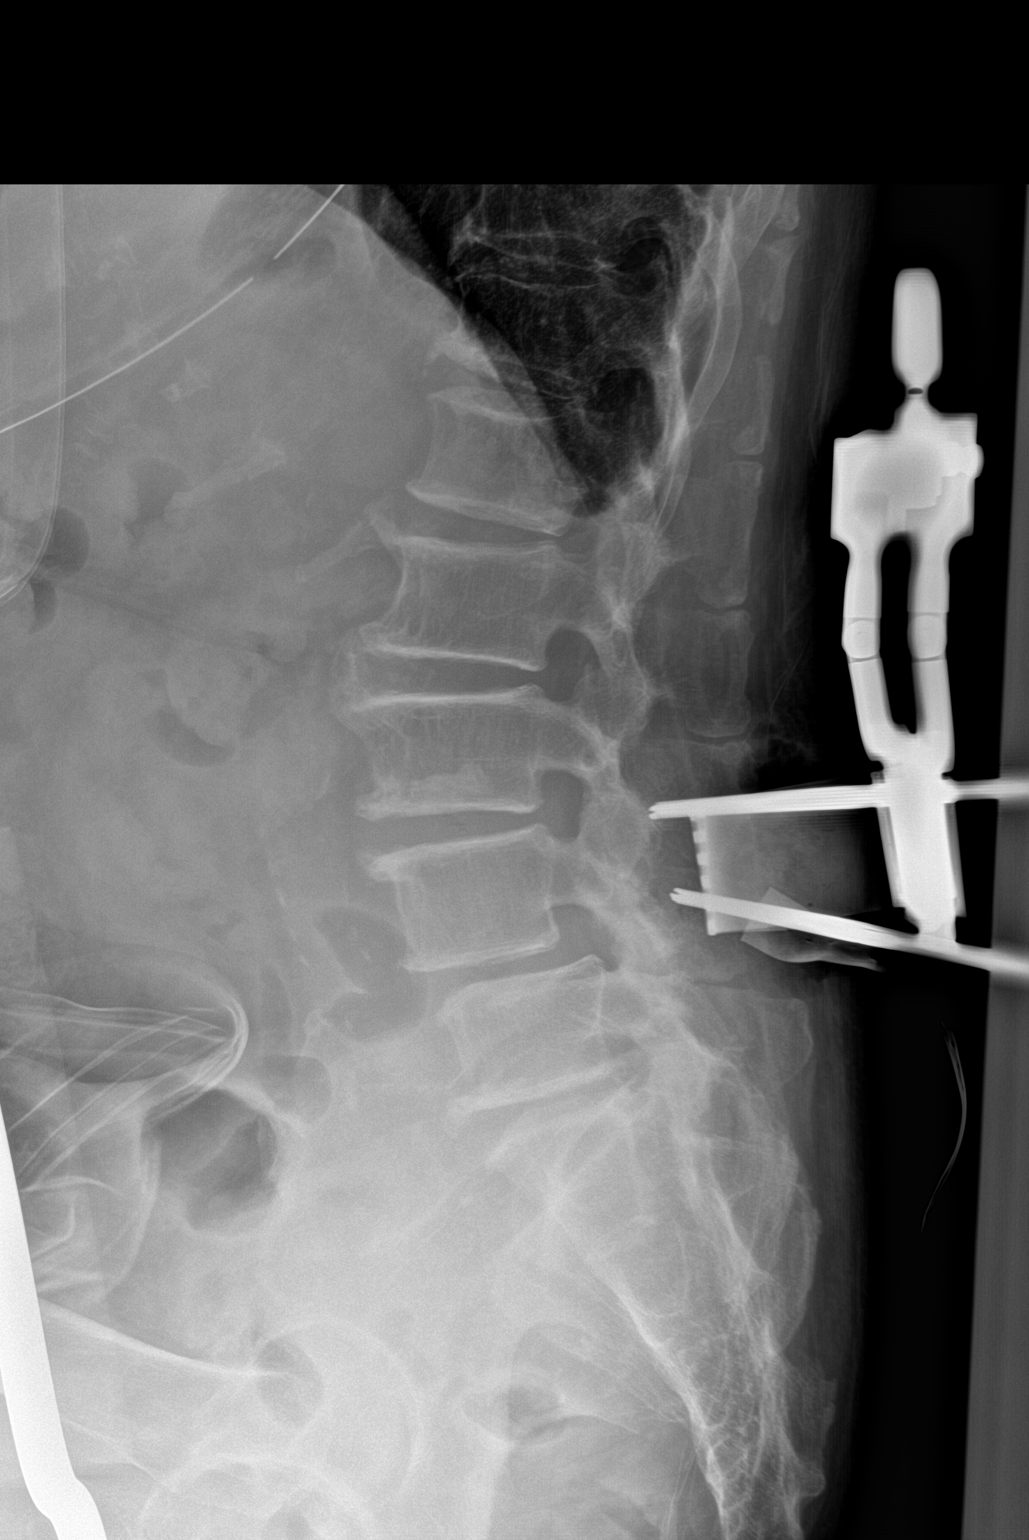

[1 of 1 positions shown; findings below may reference images not displayed]

FINDINGS: Posterior surgical instruments project over the L3 and L4 spinous
processes.
IMPRESSION: Intraoperative localization as above.

## 2017-03-27 IMAGING — DX DG SPINE 1V PORT
1 series · 1 of 1 positions shown · non-contrast
Comparison: 09/03/2016.

CLINICAL DATA: Lumbar spine surgery.

EXAM:
PORTABLE SPINE - 1 VIEW

[l-spine lat]
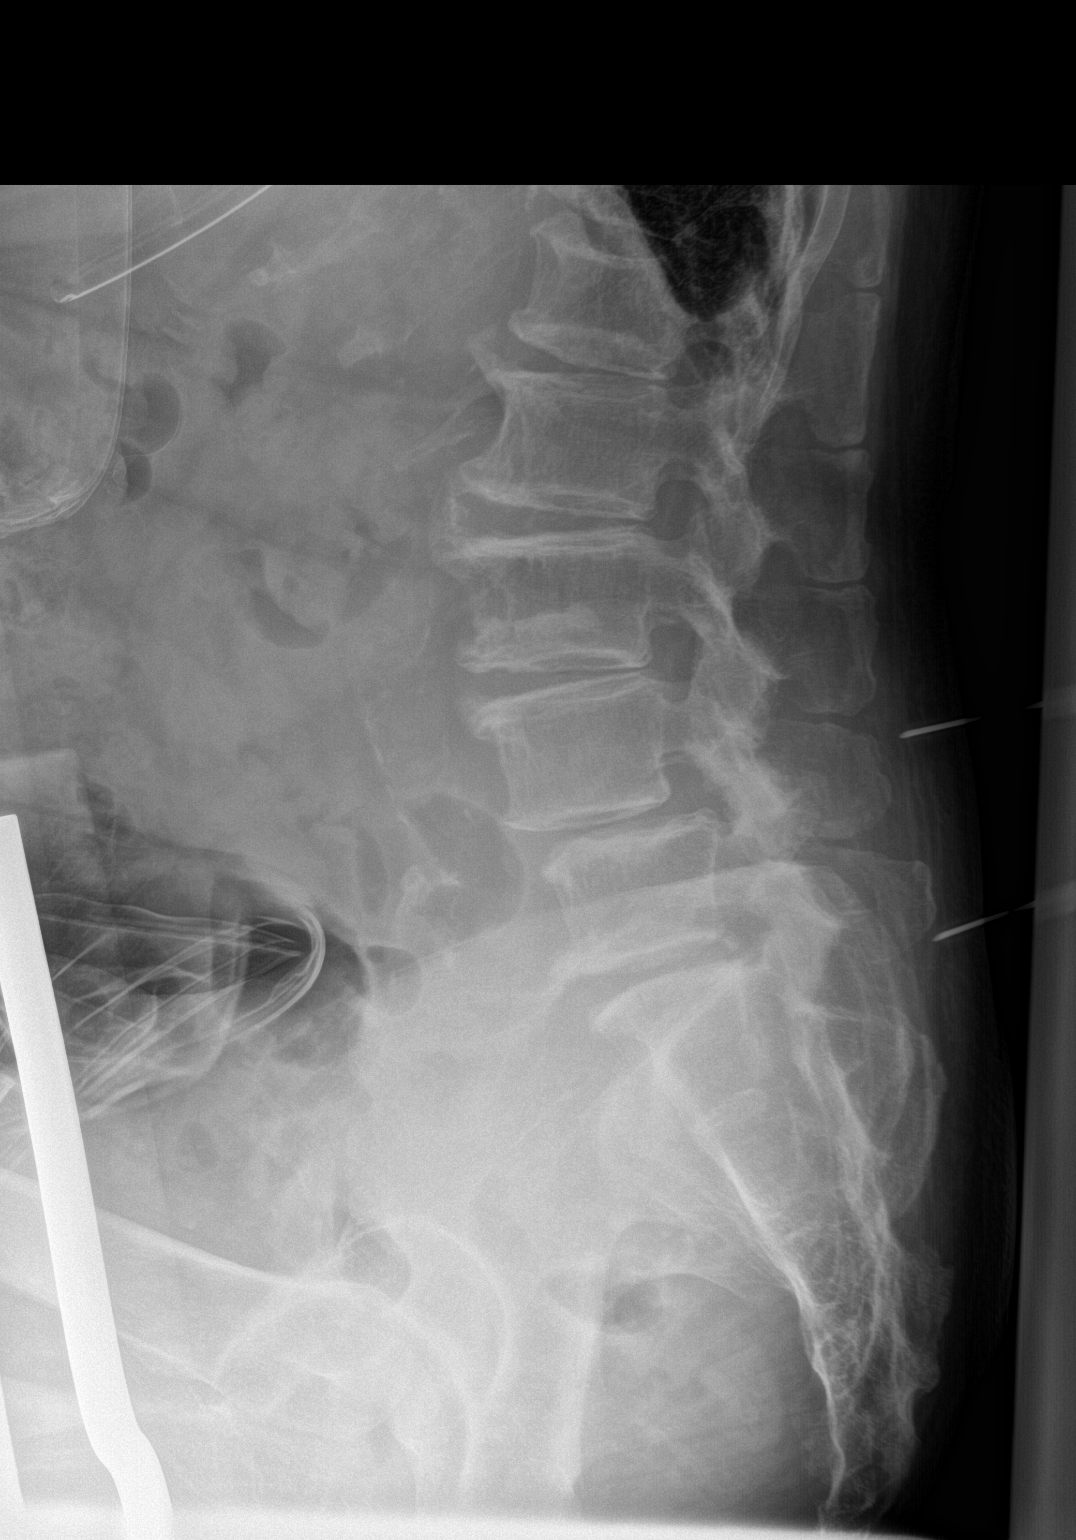

[1 of 1 positions shown; findings below may reference images not displayed]

FINDINGS: Lumbar spine numbered as per prior study of 09/03/2016. Metallic
markers noted posteriorly at the L4-L5 and L5-S1 disc space levels.
Stable anterolisthesis L4 on L5. Diffuse degenerative change.
Aortoiliac atherosclerotic vascular disease.
IMPRESSION: Metallic markers noted posteriorly at the L4-L5 and L5-S1 disc space
levels.

## 2017-04-08 ENCOUNTER — Telehealth: Payer: Self-pay | Admitting: Family Medicine

## 2017-04-08 DIAGNOSIS — I10 Essential (primary) hypertension: Secondary | ICD-10-CM

## 2017-04-08 NOTE — Telephone Encounter (Signed)
Relation to BM:BOMQ Call back number:[phone] Pharmacy: [facility], [place] [phone] (Phone) [phone] (Fax)     Reason for call:  Patient requesting 90 day supply carvedilol (COREG) 12.5 MG tablet, patient surgeon suggested increase hydrochlorothiazide (MICROZIDE) 12.5 MG capsule due to leg being swollen, patient currently taking 2 pills a day please advise if MG can be increase, please advise

## 2017-04-09 MED ORDER — CARVEDILOL 12.5 MG PO TABS: 12.5000 mg | ORAL_TABLET | Freq: Every day | ORAL | 0 refills | Status: DC

## 2017-04-09 NOTE — Telephone Encounter (Signed)
Ok to increase her HCTZ to 25 mg tabs, 1 tab po daily disp #90 with 1 rf and she needs a bp check with cmp in 2-4 weeks

## 2017-04-09 NOTE — Telephone Encounter (Signed)
Jennifer Wilkins is requesting (per her surgeon's suggestion)  HCTZ 12.5mg  be increased due to her leg being swollen/currently taking 2qd/she is scheduled to see you in December  I LMOVM for pt to RTC as well/I asked that she let me know if she is having any redness, pain and how long she has been taking the HCTZ at 2qd and if it has helped  Plz advise if you would like to increase this prior to me getting a RTC from her/thx dmf  (I already faxed 90d Rx of Carvedilol to [facility])

## 2017-04-10 MED ORDER — HYDROCHLOROTHIAZIDE 25 MG PO TABS: 25.0000 mg | ORAL_TABLET | Freq: Every day | ORAL | 1 refills | Status: DC

## 2017-04-10 NOTE — Telephone Encounter (Signed)
Per SB increased HCTZ to 25mg  and faxed #90+1 to [facility] per pt req/patient is now scheduled for a nurse visit for BP check in 3 weeks as well as lab visit for CMP directly after/thx dmf

## 2017-05-01 ENCOUNTER — Other Ambulatory Visit (INDEPENDENT_AMBULATORY_CARE_PROVIDER_SITE_OTHER): Payer: Medicare Other

## 2017-05-01 ENCOUNTER — Ambulatory Visit (INDEPENDENT_AMBULATORY_CARE_PROVIDER_SITE_OTHER): Payer: Medicare Other | Admitting: Family Medicine

## 2017-05-01 VITALS — BP 122/86 | HR 86

## 2017-05-01 DIAGNOSIS — I10 Essential (primary) hypertension: Secondary | ICD-10-CM

## 2017-05-01 NOTE — Progress Notes (Signed)
Pre visit review using our clinic tool,if applicable. No additional management support is needed unless otherwise documented below in the visit note.   Patient in for BP check per Dr. Charlett Blake dated 04/10/17 Increase Hctz to 25 mg from 12.5 mg due to swelling.  Patient complains of back pain continued and fatigue.  BP today = 122/86 Pulse = 86  Per Dr. Charlett Blake patients BP good today.  Continue medications as ordered.Check with back surgeon regarding back pain. May come in for OV sooner than next scheduled appointment 06/2017 to discuss fatigue if she'd like.   Patient states she will schedule appointment sooner.

## 2017-05-02 DIAGNOSIS — Z23 Encounter for immunization: Secondary | ICD-10-CM | POA: Diagnosis not present

## 2017-05-02 LAB — COMPREHENSIVE METABOLIC PANEL
ALBUMIN: 3.9 g/dL (ref 3.5–5.2)
ALK PHOS: 65 U/L (ref 39–117)
ALT: 16 U/L (ref 0–35)
AST: 18 U/L (ref 0–37)
BILIRUBIN TOTAL: 0.5 mg/dL (ref 0.2–1.2)
BUN: 23 mg/dL (ref 6–23)
CALCIUM: 9.2 mg/dL (ref 8.4–10.5)
CO2: 31 meq/L (ref 19–32)
CREATININE: 0.99 mg/dL (ref 0.40–1.20)
Chloride: 100 mEq/L (ref 96–112)
GFR: 56.83 mL/min — AB (ref >60.00)
Glucose, Bld: 117 mg/dL — ABNORMAL HIGH (ref 70–99)
Potassium: 4.2 mEq/L (ref 3.5–5.1)
Sodium: 138 mEq/L (ref 135–145)
Total Protein: 6.9 g/dL (ref 6.0–8.3)

## 2017-05-02 NOTE — Progress Notes (Signed)
Nurseblood pressure check note reviewed. Agree with documention and plan. 

## 2017-05-05 ENCOUNTER — Ambulatory Visit (INDEPENDENT_AMBULATORY_CARE_PROVIDER_SITE_OTHER): Payer: Medicare Other | Admitting: Family Medicine

## 2017-05-05 ENCOUNTER — Encounter: Payer: Self-pay | Admitting: Family Medicine

## 2017-05-05 VITALS — BP 120/78 | HR 72 | Temp 97.7°F | Ht <= 58 in | Wt 118.0 lb

## 2017-05-05 DIAGNOSIS — R350 Frequency of micturition: Secondary | ICD-10-CM | POA: Diagnosis not present

## 2017-05-05 DIAGNOSIS — E782 Mixed hyperlipidemia: Secondary | ICD-10-CM

## 2017-05-05 DIAGNOSIS — M79604 Pain in right leg: Secondary | ICD-10-CM

## 2017-05-05 DIAGNOSIS — M545 Low back pain: Secondary | ICD-10-CM | POA: Diagnosis not present

## 2017-05-05 DIAGNOSIS — D509 Iron deficiency anemia, unspecified: Secondary | ICD-10-CM

## 2017-05-05 DIAGNOSIS — R5383 Other fatigue: Secondary | ICD-10-CM | POA: Diagnosis not present

## 2017-05-05 DIAGNOSIS — R739 Hyperglycemia, unspecified: Secondary | ICD-10-CM

## 2017-05-05 DIAGNOSIS — I10 Essential (primary) hypertension: Secondary | ICD-10-CM

## 2017-05-05 HISTORY — DX: Other fatigue: R53.83

## 2017-05-05 HISTORY — DX: Frequency of micturition: R35.0

## 2017-05-05 HISTORY — DX: Hyperglycemia, unspecified: R73.9

## 2017-05-05 LAB — URINALYSIS
BILIRUBIN URINE: NEGATIVE
HGB URINE DIPSTICK: NEGATIVE
Ketones, ur: NEGATIVE
LEUKOCYTES UA: NEGATIVE
NITRITE: NEGATIVE
Specific Gravity, Urine: 1.01 (ref 1.000–1.030)
Total Protein, Urine: NEGATIVE
UROBILINOGEN UA: 0.2 (ref 0.0–1.0)
Urine Glucose: NEGATIVE
pH: 7 (ref 5.0–8.0)

## 2017-05-05 LAB — CBC WITH DIFFERENTIAL/PLATELET
Basophils Absolute: 0 10*3/uL (ref 0.0–0.1)
Basophils Relative: 0.2 % (ref 0.0–3.0)
EOS PCT: 0.9 % (ref 0.0–5.0)
Eosinophils Absolute: 0.1 10*3/uL (ref 0.0–0.7)
HCT: 41.1 % (ref 36.0–46.0)
Hemoglobin: 13.4 g/dL (ref 12.0–15.0)
LYMPHS ABS: 1.9 10*3/uL (ref 0.7–4.0)
Lymphocytes Relative: 31.6 % (ref 12.0–46.0)
MCHC: 32.7 g/dL (ref 30.0–36.0)
MCV: 88.1 fl (ref 78.0–100.0)
MONO ABS: 0.5 10*3/uL (ref 0.1–1.0)
Monocytes Relative: 9.2 % (ref 3.0–12.0)
NEUTROS ABS: 3.4 10*3/uL (ref 1.4–7.7)
NEUTROS PCT: 58.1 % (ref 43.0–77.0)
PLATELETS: 216 10*3/uL (ref 150.0–400.0)
RBC: 4.66 Mil/uL (ref 3.87–5.11)
RDW: 15.7 % — AB (ref 11.5–15.5)
WBC: 5.9 10*3/uL (ref 4.0–10.5)

## 2017-05-05 LAB — COMPREHENSIVE METABOLIC PANEL
ALBUMIN: 4.1 g/dL (ref 3.5–5.2)
ALK PHOS: 53 U/L (ref 39–117)
ALT: 16 U/L (ref 0–35)
AST: 15 U/L (ref 0–37)
BUN: 24 mg/dL — AB (ref 6–23)
CALCIUM: 9.5 mg/dL (ref 8.4–10.5)
CO2: 30 mEq/L (ref 19–32)
CREATININE: 0.87 mg/dL (ref 0.40–1.20)
Chloride: 100 mEq/L (ref 96–112)
GFR: 65.97 mL/min (ref >60.00)
Glucose, Bld: 94 mg/dL (ref 70–99)
POTASSIUM: 4 meq/L (ref 3.5–5.1)
SODIUM: 139 meq/L (ref 135–145)
TOTAL PROTEIN: 7.2 g/dL (ref 6.0–8.3)
Total Bilirubin: 0.5 mg/dL (ref 0.2–1.2)

## 2017-05-05 LAB — TSH: TSH: 1.26 u[IU]/mL (ref 0.35–4.50)

## 2017-05-05 LAB — LIPID PANEL
CHOLESTEROL: 180 mg/dL (ref 0–200)
HDL: 86.3 mg/dL (ref >39.00)
LDL Cholesterol: 68 mg/dL (ref 0–99)
NonHDL: 93.94
TRIGLYCERIDES: 132 mg/dL (ref 0.0–149.0)
Total CHOL/HDL Ratio: 2
VLDL: 26.4 mg/dL (ref 0.0–40.0)

## 2017-05-05 LAB — HEMOGLOBIN A1C: Hgb A1c MFr Bld: 5.8 % (ref 4.6–6.5)

## 2017-05-05 NOTE — Assessment & Plan Note (Signed)
Check urine culture  

## 2017-05-05 NOTE — Assessment & Plan Note (Signed)
Encouraged heart healthy diet, increase exercise, avoid trans fats, consider a krill oil cap daily 

## 2017-05-05 NOTE — Assessment & Plan Note (Signed)
Unclear etiology check labs and consider prolonged grieving reaction. Discussed with patient this possibility and she will think about whether she

## 2017-05-05 NOTE — Assessment & Plan Note (Signed)
Resolved after surgery. Will recheckc

## 2017-05-05 NOTE — Patient Instructions (Signed)
Try the Aspercreme Lidocaine gel comes in a roll on  OK to take the Tylenol/Acetaminophen ES 500 mg tabs, 2 tabs twice daily Consider a medication for anxiety and deprssion to see if that helps the concentration, fatigue  Hypertension Hypertension, commonly called high blood pressure, is when the force of blood pumping through the arteries is too strong. The arteries are the blood vessels that carry blood from the heart throughout the body. Hypertension forces the heart to work harder to pump blood and may cause arteries to become narrow or stiff. Having untreated or uncontrolled hypertension can cause heart attacks, strokes, kidney disease, and other problems. A blood pressure reading consists of a higher number over a lower number. Ideally, your blood pressure should be below 120/80. The first ("top") number is called the systolic pressure. It is a measure of the pressure in your arteries as your heart beats. The second ("bottom") number is called the diastolic pressure. It is a measure of the pressure in your arteries as the heart relaxes. What are the causes? The cause of this condition is not known. What increases the risk? Some risk factors for high blood pressure are under your control. Others are not. Factors you can change  Smoking.  Having type 2 diabetes mellitus, high cholesterol, or both.  Not getting enough exercise or physical activity.  Being overweight.  Having too much fat, sugar, calories, or salt (sodium) in your diet.  Drinking too much alcohol. Factors that are difficult or impossible to change  Having chronic kidney disease.  Having a family history of high blood pressure.  Age. Risk increases with age.  Race. You may be at higher risk if you are African-American.  Gender. Men are at higher risk than women before age 57. After age 42, women are at higher risk than men.  Having obstructive sleep apnea.  Stress. What are the signs or symptoms? Extremely  high blood pressure (hypertensive crisis) may cause:  Headache.  Anxiety.  Shortness of breath.  Nosebleed.  Nausea and vomiting.  Severe chest pain.  Jerky movements you cannot control (seizures).  How is this diagnosed? This condition is diagnosed by measuring your blood pressure while you are seated, with your arm resting on a surface. The cuff of the blood pressure monitor will be placed directly against the skin of your upper arm at the level of your heart. It should be measured at least twice using the same arm. Certain conditions can cause a difference in blood pressure between your right and left arms. Certain factors can cause blood pressure readings to be lower or higher than normal (elevated) for a short period of time:  When your blood pressure is higher when you are in a health care provider's office than when you are at home, this is called white coat hypertension. Most people with this condition do not need medicines.  When your blood pressure is higher at home than when you are in a health care provider's office, this is called masked hypertension. Most people with this condition may need medicines to control blood pressure.  If you have a high blood pressure reading during one visit or you have normal blood pressure with other risk factors:  You may be asked to return on a different day to have your blood pressure checked again.  You may be asked to monitor your blood pressure at home for 1 week or longer.  If you are diagnosed with hypertension, you may have other blood or imaging  tests to help your health care provider understand your overall risk for other conditions. How is this treated? This condition is treated by making healthy lifestyle changes, such as eating healthy foods, exercising more, and reducing your alcohol intake. Your health care provider may prescribe medicine if lifestyle changes are not enough to get your blood pressure under control, and  if:  Your systolic blood pressure is above 130.  Your diastolic blood pressure is above 80.  Your personal target blood pressure may vary depending on your medical conditions, your age, and other factors. Follow these instructions at home: Eating and drinking  Eat a diet that is high in fiber and potassium, and low in sodium, added sugar, and fat. An example eating plan is called the DASH (Dietary Approaches to Stop Hypertension) diet. To eat this way: ? Eat plenty of fresh fruits and vegetables. Try to fill half of your plate at each meal with fruits and vegetables. ? Eat whole grains, such as whole wheat pasta, brown rice, or whole grain bread. Fill about one quarter of your plate with whole grains. ? Eat or drink low-fat dairy products, such as skim milk or low-fat yogurt. ? Avoid fatty cuts of meat, processed or cured meats, and poultry with skin. Fill about one quarter of your plate with lean proteins, such as fish, chicken without skin, beans, eggs, and tofu. ? Avoid premade and processed foods. These tend to be higher in sodium, added sugar, and fat.  Reduce your daily sodium intake. Most people with hypertension should eat less than 1,500 mg of sodium a day.  Limit alcohol intake to no more than 1 drink a day for nonpregnant women and 2 drinks a day for men. One drink equals 12 oz of beer, 5 oz of wine, or 1 oz of hard liquor. Lifestyle  Work with your health care provider to maintain a healthy body weight or to lose weight. Ask what an ideal weight is for you.  Get at least 30 minutes of exercise that causes your heart to beat faster (aerobic exercise) most days of the week. Activities may include walking, swimming, or biking.  Include exercise to strengthen your muscles (resistance exercise), such as pilates or lifting weights, as part of your weekly exercise routine. Try to do these types of exercises for 30 minutes at least 3 days a week.  Do not use any products that contain  nicotine or tobacco, such as cigarettes and e-cigarettes. If you need help quitting, ask your health care provider.  Monitor your blood pressure at home as told by your health care provider.  Keep all follow-up visits as told by your health care provider. This is important. Medicines  Take over-the-counter and prescription medicines only as told by your health care provider. Follow directions carefully. Blood pressure medicines must be taken as prescribed.  Do not skip doses of blood pressure medicine. Doing this puts you at risk for problems and can make the medicine less effective.  Ask your health care provider about side effects or reactions to medicines that you should watch for. Contact a health care provider if:  You think you are having a reaction to a medicine you are taking.  You have headaches that keep coming back (recurring).  You feel dizzy.  You have swelling in your ankles.  You have trouble with your vision. Get help right away if:  You develop a severe headache or confusion.  You have unusual weakness or numbness.  You feel faint.  You have severe pain in your chest or abdomen.  You vomit repeatedly.  You have trouble breathing. Summary  Hypertension is when the force of blood pumping through your arteries is too strong. If this condition is not controlled, it may put you at risk for serious complications.  Your personal target blood pressure may vary depending on your medical conditions, your age, and other factors. For most people, a normal blood pressure is less than 120/80.  Hypertension is treated with lifestyle changes, medicines, or a combination of both. Lifestyle changes include weight loss, eating a healthy, low-sodium diet, exercising more, and limiting alcohol. This information is not intended to replace advice given to you by your health care provider. Make sure you discuss any questions you have with your health care provider. Document  Released: 07/15/2005 Document Revised: 06/12/2016 Document Reviewed: 06/12/2016 Elsevier Interactive Patient Education  [employer].

## 2017-05-05 NOTE — Assessment & Plan Note (Signed)
Pain is improved since her surgery with Dr Tonita Cong but she is frustrated with how much weaker and tired she feels over all. She did undergo the rehab and found that helpful but not as helpful as she would like. Will check labs today to further investigate and reassess at next visit. May increase Tylenol 500 mg tabs, 2 tabs po bid.

## 2017-05-05 NOTE — Progress Notes (Signed)
Subjective:  I acted as a Education administrator for Dr. Rogue Jury, CMA   Patient ID: Jennifer Wilkins, female    DOB: [birth year], 83 y.o.   MRN: [id number]  Chief Complaint  Patient presents with  . Fatigue    Pt states that she's been abnormallly tired for about "2 weeks or so". States that she had labs drawn last weak and wants to discuss results with Provider.     HPI  Patient is in today for fatigue. Onset about 2 weeks ago. She has been struggling with some pain and debility since her back surgery but in the past 2 weeks her fatigue and malaise have worsened. She also notes some urinary frequency. No dysuria or hematuria. No fevers or chills she reports she is eating well. She acknowledges she has been sad since the death of her husband but she feels she is handling it fairly well. Denies CP/palp/SOB/HA/congestion/fevers/GI or GU c/o. Taking meds as prescribed  Patient Care Team: Mosie Lukes, MD as PCP - General (Family Medicine) Marica Otter, OD (Optometry)   Past Medical History:  Diagnosis Date  . Anemia 09/30/2016  . Arthritis   . Elevated BP 04/21/2014  . HTN (hypertension)   . Hypercholesteremia   . Hypocalcemia 06/19/2013  . Low back pain radiating to both legs 08/27/2015  . Medicare annual wellness visit, subsequent 05/03/2015  . Preventative health care 06/02/2016  . Stress reaction 04/25/2015    Past Surgical History:  Procedure Laterality Date  . CHOLECYSTECTOMY    . GALLBLADDER SURGERY  1984   Gallstone removal  . LUMBAR LAMINECTOMY/DECOMPRESSION MICRODISCECTOMY N/A 09/11/2016   Procedure: Microlumbar Decompression L3-4 and L4-5, lateral mass fusion at L4-5 using autologous, autograft bone;  Surgeon: Susa Day, MD;  Location: [facility];  Service: Orthopedics;  Laterality: N/A;    Family History  Problem Relation Age of Onset  . Osteoporosis Mother   . Ulcers Father   . Kidney disease Paternal Grandfather     Social History   Social History  . Marital status: Married      Spouse name: N/A  . Number of children: N/A  . Years of education: N/A   Occupational History  . Not on file.   Social History Main Topics  . Smoking status: Never Smoker  . Smokeless tobacco: Never Used  . Alcohol use No  . Drug use: No  . Sexual activity: Not Currently   Other Topics Concern  . Not on file   Social History Narrative   Widowed July 2017    Outpatient Medications Prior to Visit  Medication Sig Dispense Refill  . aspirin 81 MG tablet Take 1 tablet (81 mg total) by mouth daily. Resume 4 days post-op 30 tablet   . Calcium Carbonate-Vitamin D (CALCIUM 600/VITAMIN D) 600-400 MG-UNIT per tablet Take 1 tablet by mouth 2 (two) times daily.    . carvedilol (COREG) 12.5 MG tablet Take 1 tablet (12.5 mg total) by mouth daily. 90 tablet 0  . cholecalciferol (VITAMIN D) 1000 units tablet Take 1,000 Units by mouth daily.    . Cyanocobalamin (VITAMIN B-12 CR) 1500 MCG TBCR Take by mouth daily.    Marland Kitchen FIBER ADULT GUMMIES PO Take 2 each by mouth.    . hydrochlorothiazide (HYDRODIURIL) 25 MG tablet Take 1 tablet (25 mg total) by mouth daily. (Patient taking differently: Take 50 mg by mouth daily. ) 90 tablet 1  . Misc Natural Products (OSTEO BI-FLEX ADV JOINT SHIELD) TABS Take 1 tablet by  mouth daily. Resume 5 days post-op    . Multiple Vitamins-Minerals (HAIR/SKIN/NAILS/BIOTIN) TABS Take 1 tablet by mouth daily. Resume 5 days post-op    . Multiple Vitamins-Minerals (MULTIVITAMIN WITH MINERALS) tablet Take 1 tablet by mouth daily. Resume 5 days post-op    . polyethylene glycol (MIRALAX / GLYCOLAX) packet Take 17 g by mouth daily. 14 each 0  . Probiotic Product (PROBIOTIC DAILY PO) Take by mouth.    . Turmeric 500 MG CAPS Take 1 capsule by mouth daily. Resume 5 days post-op as needed    . vitamin E 400 UNIT capsule Take 1 capsule (400 Units total) by mouth daily. Resume 5 days post-op     No facility-administered medications prior to visit.     No Known Allergies  Review of  Systems  Constitutional: Positive for malaise/fatigue. Negative for fever.  HENT: Negative for congestion.   Eyes: Negative for blurred vision.  Respiratory: Negative for shortness of breath.   Cardiovascular: Negative for chest pain, palpitations and leg swelling.  Gastrointestinal: Negative for abdominal pain, blood in stool and nausea.  Genitourinary: Positive for frequency. Negative for dysuria.  Musculoskeletal: Positive for back pain. Negative for falls.  Skin: Negative for rash.  Neurological: Negative for dizziness, loss of consciousness and headaches.  Endo/Heme/Allergies: Negative for environmental allergies.  Psychiatric/Behavioral: Negative for depression. The patient is not nervous/anxious.        Objective:    Physical Exam  Constitutional: She is oriented to person, place, and time. She appears well-developed and well-nourished. No distress.  HENT:  Head: Normocephalic and atraumatic.  Eyes: Conjunctivae are normal.  Neck: Neck supple. No thyromegaly present.  Cardiovascular: Normal rate, regular rhythm and normal heart sounds.   No murmur heard. Pulmonary/Chest: Effort normal and breath sounds normal. No respiratory distress.  Abdominal: Soft. Bowel sounds are normal. She exhibits no distension and no mass. There is no tenderness.  Musculoskeletal: She exhibits no edema.  Lymphadenopathy:    She has no cervical adenopathy.  Neurological: She is alert and oriented to person, place, and time.  Skin: Skin is warm and dry.  Psychiatric: She has a normal mood and affect. Her behavior is normal.    BP 120/78   Pulse 72   Temp 97.7 F (36.5 C) (Oral)   Ht 4\' 8"  (1.422 m)   Wt 118 lb (53.5 kg)   SpO2 98%   BMI 26.46 kg/m  Wt Readings from Last 3 Encounters:  05/05/17 118 lb (53.5 kg)  12/30/16 111 lb 3.2 oz (50.4 kg)  09/30/16 107 lb 3.2 oz (48.6 kg)   BP Readings from Last 3 Encounters:  05/05/17 120/78  05/01/17 122/86  12/30/16 122/80      Immunization History  Administered Date(s) Administered  . Influenza Split 04/24/2012  . Influenza, High Dose Seasonal PF 04/25/2015, 05/02/2017  . Influenza,inj,Quad PF,6+ Mos 04/21/2014, 05/02/2017  . Pneumococcal Conjugate-13 06/14/2013, 10/14/2014  . Pneumococcal Polysaccharide-23 05/30/2016  . Tdap 06/14/2013  . Zoster 07/29/2014    Health Maintenance  Topic Date Due  . INFLUENZA VACCINE  02/26/2017  . TETANUS/TDAP  06/15/2023  . DEXA SCAN  Completed  . PNA vac Low Risk Adult  Completed    Lab Results  Component Value Date   WBC 5.9 05/05/2017   HGB 13.4 05/05/2017   HCT 41.1 05/05/2017   PLT 216.0 05/05/2017   GLUCOSE 94 05/05/2017   CHOL 180 05/05/2017   TRIG 132.0 05/05/2017   HDL 86.30 05/05/2017   LDLCALC 68 05/05/2017  ALT 16 05/05/2017   AST 15 05/05/2017   NA 139 05/05/2017   K 4.0 05/05/2017   CL 100 05/05/2017   CREATININE 0.87 05/05/2017   BUN 24 (H) 05/05/2017   CO2 30 05/05/2017   TSH 1.26 05/05/2017   HGBA1C 5.8 05/05/2017    Lab Results  Component Value Date   TSH 1.26 05/05/2017   Lab Results  Component Value Date   WBC 5.9 05/05/2017   HGB 13.4 05/05/2017   HCT 41.1 05/05/2017   MCV 88.1 05/05/2017   PLT 216.0 05/05/2017   Lab Results  Component Value Date   NA 139 05/05/2017   K 4.0 05/05/2017   CO2 30 05/05/2017   GLUCOSE 94 05/05/2017   BUN 24 (H) 05/05/2017   CREATININE 0.87 05/05/2017   BILITOT 0.5 05/05/2017   ALKPHOS 53 05/05/2017   AST 15 05/05/2017   ALT 16 05/05/2017   PROT 7.2 05/05/2017   ALBUMIN 4.1 05/05/2017   CALCIUM 9.5 05/05/2017   ANIONGAP 7 09/12/2016   GFR 65.97 05/05/2017   Lab Results  Component Value Date   CHOL 180 05/05/2017   Lab Results  Component Value Date   HDL 86.30 05/05/2017   Lab Results  Component Value Date   LDLCALC 68 05/05/2017   Lab Results  Component Value Date   TRIG 132.0 05/05/2017   Lab Results  Component Value Date   CHOLHDL 2 05/05/2017   Lab Results   Component Value Date   HGBA1C 5.8 05/05/2017         Assessment & Plan:   Problem List Items Addressed This Visit    Hyperlipidemia, mixed    Encouraged heart healthy diet, increase exercise, avoid trans fats, consider a krill oil cap daily      Essential hypertension    Well controlled, no changes to meds. Encouraged heart healthy diet such as the DASH diet and exercise as tolerated.       Relevant Orders   Comprehensive metabolic panel (Completed)   Lipid panel (Completed)   TSH (Completed)   Low back pain radiating to both legs    Pain is improved since her surgery with Dr Tonita Cong but she is frustrated with how much weaker and tired she feels over all. She did undergo the rehab and found that helpful but not as helpful as she would like. Will check labs today to further investigate and reassess at next visit. May increase Tylenol 500 mg tabs, 2 tabs po bid.       Anemia    Resolved after surgery. Will recheckc      Relevant Orders   CBC with Differential/Platelet (Completed)   Hyperglycemia   Relevant Orders   Hemoglobin A1c (Completed)   Urinary frequency    Check urine culture      Relevant Orders   Urinalysis (Completed)   Urine Culture   Other fatigue - Primary    Unclear etiology check labs and consider prolonged grieving reaction. Discussed with patient this possibility and she will think about whether she          I am having Ms. Daniele maintain her Vitamin B-12 CR, Calcium Carbonate-Vitamin D, Probiotic Product (PROBIOTIC DAILY PO), FIBER ADULT GUMMIES PO, cholecalciferol, polyethylene glycol, aspirin, multivitamin with minerals, HAIR/SKIN/NAILS/BIOTIN, OSTEO BI-FLEX ADV JOINT SHIELD, Turmeric, vitamin E, carvedilol, hydrochlorothiazide, Methyl Salicylate-Lido-Menthol (LIDOCAINE OINT & MEN-METH SAL EX), and Diclofenac Sodium.  Meds ordered this encounter  Medications  . Methyl Salicylate-Lido-Menthol (LIDOCAINE OINT & MEN-METH SAL EX)  Sig: Apply  topically.  . Diclofenac Sodium (PENNSAID) 2 % SOLN    Sig: Place onto the skin.    CMA served as Education administrator during this visit. History, Physical and Plan performed by medical provider. Documentation and orders reviewed and attested to.  Penni Homans, MD

## 2017-05-05 NOTE — Assessment & Plan Note (Signed)
Well controlled, no changes to meds. Encouraged heart healthy diet such as the DASH diet and exercise as tolerated.  °

## 2017-05-06 LAB — URINE CULTURE
MICRO NUMBER:: 81117505
Result:: NO GROWTH
SPECIMEN QUALITY:: ADEQUATE

## 2017-07-02 ENCOUNTER — Telehealth: Payer: Self-pay | Admitting: Family Medicine

## 2017-07-02 NOTE — Telephone Encounter (Signed)
[employer] benefits received PA NOT required $183 deductible met 20% co-insurance (covered by secondary)   Patient may owe approximately $0 OOP  Patient due after 07/06/17  Letter mailed to inform patient of benefits and to schedule

## 2017-07-04 ENCOUNTER — Other Ambulatory Visit (INDEPENDENT_AMBULATORY_CARE_PROVIDER_SITE_OTHER): Payer: Medicare Other

## 2017-07-04 ENCOUNTER — Other Ambulatory Visit: Payer: Self-pay | Admitting: Family Medicine

## 2017-07-04 DIAGNOSIS — E782 Mixed hyperlipidemia: Secondary | ICD-10-CM | POA: Diagnosis not present

## 2017-07-04 DIAGNOSIS — M810 Age-related osteoporosis without current pathological fracture: Secondary | ICD-10-CM | POA: Diagnosis not present

## 2017-07-04 DIAGNOSIS — D75839 Thrombocytosis, unspecified: Secondary | ICD-10-CM

## 2017-07-04 DIAGNOSIS — Z0000 Encounter for general adult medical examination without abnormal findings: Secondary | ICD-10-CM | POA: Diagnosis not present

## 2017-07-04 DIAGNOSIS — E559 Vitamin D deficiency, unspecified: Secondary | ICD-10-CM | POA: Diagnosis not present

## 2017-07-04 DIAGNOSIS — I10 Essential (primary) hypertension: Secondary | ICD-10-CM

## 2017-07-04 DIAGNOSIS — D473 Essential (hemorrhagic) thrombocythemia: Secondary | ICD-10-CM | POA: Diagnosis not present

## 2017-07-04 LAB — LIPID PANEL
CHOL/HDL RATIO: 2
Cholesterol: 160 mg/dL (ref 0–200)
HDL: 82.5 mg/dL (ref >39.00)
LDL CALC: 46 mg/dL (ref 0–99)
NONHDL: 77.04
Triglycerides: 157 mg/dL — ABNORMAL HIGH (ref 0.0–149.0)
VLDL: 31.4 mg/dL (ref 0.0–40.0)

## 2017-07-04 LAB — VITAMIN D 25 HYDROXY (VIT D DEFICIENCY, FRACTURES): VITD: 97.28 ng/mL (ref 30.00–100.00)

## 2017-07-04 LAB — CBC
HCT: 41.7 % (ref 36.0–46.0)
Hemoglobin: 13.7 g/dL (ref 12.0–15.0)
MCHC: 32.8 g/dL (ref 30.0–36.0)
MCV: 89.1 fl (ref 78.0–100.0)
PLATELETS: 222 10*3/uL (ref 150.0–400.0)
RBC: 4.68 Mil/uL (ref 3.87–5.11)
RDW: 14.6 % (ref 11.5–15.5)
WBC: 6.3 10*3/uL (ref 4.0–10.5)

## 2017-07-04 LAB — TSH: TSH: 2.56 u[IU]/mL (ref 0.35–4.50)

## 2017-07-04 MED ORDER — HYDROCHLOROTHIAZIDE 25 MG PO TABS: 50.0000 mg | ORAL_TABLET | Freq: Every day | ORAL | 1 refills | Status: DC

## 2017-07-04 NOTE — Telephone Encounter (Signed)
Patient came in for labs visit, Patient request to have a refill script on the following RX  Hydrochlorothcapide 25mg , patient stts she only has ten pills left.

## 2017-07-04 NOTE — Telephone Encounter (Signed)
Rx filled

## 2017-07-11 ENCOUNTER — Ambulatory Visit (HOSPITAL_BASED_OUTPATIENT_CLINIC_OR_DEPARTMENT_OTHER)
Admission: RE | Admit: 2017-07-11 | Discharge: 2017-07-11 | Disposition: A | Discharge status: Home / Self Care | Payer: Medicare Other | Source: Ambulatory Visit | Attending: Family Medicine | Admitting: Family Medicine

## 2017-07-11 ENCOUNTER — Ambulatory Visit (INDEPENDENT_AMBULATORY_CARE_PROVIDER_SITE_OTHER): Payer: Medicare Other | Admitting: Family Medicine

## 2017-07-11 ENCOUNTER — Ambulatory Visit: Payer: Medicare Other | Admitting: Family Medicine

## 2017-07-11 ENCOUNTER — Encounter: Payer: Self-pay | Admitting: Family Medicine

## 2017-07-11 VITALS — BP 122/78 | HR 78 | Temp 97.5°F | Resp 18 | Wt 117.4 lb

## 2017-07-11 DIAGNOSIS — M810 Age-related osteoporosis without current pathological fracture: Secondary | ICD-10-CM

## 2017-07-11 DIAGNOSIS — R739 Hyperglycemia, unspecified: Secondary | ICD-10-CM

## 2017-07-11 DIAGNOSIS — Z780 Asymptomatic menopausal state: Secondary | ICD-10-CM | POA: Diagnosis not present

## 2017-07-11 DIAGNOSIS — I10 Essential (primary) hypertension: Secondary | ICD-10-CM | POA: Diagnosis not present

## 2017-07-11 DIAGNOSIS — E782 Mixed hyperlipidemia: Secondary | ICD-10-CM | POA: Diagnosis not present

## 2017-07-11 MED ORDER — DENOSUMAB 60 MG/ML ~~LOC~~ SOLN
60.0000 mg | Freq: Once | SUBCUTANEOUS | Status: AC
  Administered 2017-07-11: 60 mg via SUBCUTANEOUS

## 2017-07-11 NOTE — Progress Notes (Signed)
Subjective:  I acted as a Education administrator for Dr. Charlett Blake. Jennifer Wilkins, [place]  Patient ID: Jennifer Wilkins, female    DOB: 1932-02-27, 83 y.o.   MRN: [id number]  No chief complaint on file.   HPI  Patient is in today for a 6 month and is accompanied by her daughter. She feels well today. No recent febrile illness or hospitalizations. She is eating well. Denies CP/palp/SOB/HA/congestion/fevers/GI or GU c/o. Taking meds as prescribed. No polyuria or polydipsia  Patient Care Team: Mosie Lukes, MD as PCP - General (Family Medicine) Marica Otter, OD (Optometry)   Past Medical History:  Diagnosis Date  . Anemia 09/30/2016  . Arthritis   . Elevated BP 04/21/2014  . HTN (hypertension)   . Hypercholesteremia   . Hypocalcemia 06/19/2013  . Low back pain radiating to both legs 08/27/2015  . Medicare annual wellness visit, subsequent 05/03/2015  . Preventative health care 06/02/2016  . Stress reaction 04/25/2015    Past Surgical History:  Procedure Laterality Date  . CHOLECYSTECTOMY    . GALLBLADDER SURGERY  1984   Gallstone removal  . LUMBAR LAMINECTOMY/DECOMPRESSION MICRODISCECTOMY N/A 09/11/2016   Procedure: Microlumbar Decompression L3-4 and L4-5, lateral mass fusion at L4-5 using autologous, autograft bone;  Surgeon: Susa Day, MD;  Location: [facility];  Service: Orthopedics;  Laterality: N/A;    Family History  Problem Relation Age of Onset  . Osteoporosis Mother   . Ulcers Father   . Kidney disease Paternal Grandfather     Social History   Socioeconomic History  . Marital status: Married    Spouse name: Not on file  . Number of children: Not on file  . Years of education: Not on file  . Highest education level: Not on file  Social Needs  . Financial resource strain: Not on file  . Food insecurity - worry: Not on file  . Food insecurity - inability: Not on file  . Transportation needs - medical: Not on file  . Transportation needs - non-medical: Not on file  Occupational History  .  Not on file  Tobacco Use  . Smoking status: Never Smoker  . Smokeless tobacco: Never Used  Substance and Sexual Activity  . Alcohol use: No  . Drug use: No  . Sexual activity: Not Currently  Other Topics Concern  . Not on file  Social History Narrative   Widowed July 2017    Outpatient Medications Prior to Visit  Medication Sig Dispense Refill  . aspirin 81 MG tablet Take 1 tablet (81 mg total) by mouth daily. Resume 4 days post-op 30 tablet   . Calcium Carbonate-Vitamin D (CALCIUM 600/VITAMIN D) 600-400 MG-UNIT per tablet Take 1 tablet by mouth 2 (two) times daily.    . carvedilol (COREG) 12.5 MG tablet Take 1 tablet (12.5 mg total) by mouth daily. 90 tablet 0  . cholecalciferol (VITAMIN D) 1000 units tablet Take 1,000 Units by mouth daily.    . Cyanocobalamin (VITAMIN B-12 CR) 1500 MCG TBCR Take by mouth daily.    . Diclofenac Sodium (PENNSAID) 2 % SOLN Place onto the skin.    Marland Kitchen FIBER ADULT GUMMIES PO Take 2 each by mouth.    . hydrochlorothiazide (HYDRODIURIL) 25 MG tablet Take 2 tablets (50 mg total) by mouth daily. 90 tablet 1  . Methyl Salicylate-Lido-Menthol (LIDOCAINE OINT & MEN-METH SAL EX) Apply topically.    . Misc Natural Products (OSTEO BI-FLEX ADV JOINT SHIELD) TABS Take 1 tablet by mouth daily. Resume 5 days post-op    .  Multiple Vitamins-Minerals (HAIR/SKIN/NAILS/BIOTIN) TABS Take 1 tablet by mouth daily. Resume 5 days post-op    . Multiple Vitamins-Minerals (MULTIVITAMIN WITH MINERALS) tablet Take 1 tablet by mouth daily. Resume 5 days post-op    . polyethylene glycol (MIRALAX / GLYCOLAX) packet Take 17 g by mouth daily. 14 each 0  . Probiotic Product (PROBIOTIC DAILY PO) Take by mouth.    . Turmeric 500 MG CAPS Take 1 capsule by mouth daily. Resume 5 days post-op as needed    . vitamin E 400 UNIT capsule Take 1 capsule (400 Units total) by mouth daily. Resume 5 days post-op     No facility-administered medications prior to visit.     No Known Allergies  Review  of Systems  Constitutional: Negative for fever and malaise/fatigue.  HENT: Negative for congestion.   Eyes: Negative for blurred vision.  Respiratory: Negative for shortness of breath.   Cardiovascular: Negative for chest pain, palpitations and leg swelling.  Gastrointestinal: Negative for abdominal pain, blood in stool and nausea.  Genitourinary: Negative for dysuria and frequency.  Musculoskeletal: Negative for falls.  Skin: Negative for rash.  Neurological: Negative for dizziness, loss of consciousness and headaches.  Endo/Heme/Allergies: Negative for environmental allergies.  Psychiatric/Behavioral: Negative for depression. The patient is not nervous/anxious.        Objective:    Physical Exam  Constitutional: She is oriented to person, place, and time. She appears well-developed and well-nourished. No distress.  HENT:  Head: Normocephalic and atraumatic.  Nose: Nose normal.  Eyes: Right eye exhibits no discharge. Left eye exhibits no discharge.  Neck: Normal range of motion. Neck supple.  Cardiovascular: Normal rate and regular rhythm.  No murmur heard. Pulmonary/Chest: Effort normal and breath sounds normal.  Abdominal: Soft. Bowel sounds are normal. There is no tenderness.  Musculoskeletal: She exhibits no edema.  Neurological: She is alert and oriented to person, place, and time.  Skin: Skin is warm and dry.  Psychiatric: She has a normal mood and affect.  Nursing note and vitals reviewed.   BP 122/78 (BP Location: Right Arm, Patient Position: Sitting, Cuff Size: Normal)   Pulse 78   Temp (!) 97.5 F (36.4 C) (Oral)   Resp 18   Wt 117 lb 6.4 oz (53.3 kg)   SpO2 97%   BMI 26.32 kg/m  Wt Readings from Last 3 Encounters:  07/11/17 117 lb 6.4 oz (53.3 kg)  05/05/17 118 lb (53.5 kg)  12/30/16 111 lb 3.2 oz (50.4 kg)   BP Readings from Last 3 Encounters:  07/11/17 122/78  05/05/17 120/78  05/01/17 122/86     Immunization History  Administered Date(s)  Administered  . Influenza Split 04/24/2012  . Influenza, High Dose Seasonal PF 04/25/2015, 05/02/2017  . Influenza,inj,Quad PF,6+ Mos 04/21/2014, 05/02/2017  . Pneumococcal Conjugate-13 06/14/2013, 10/14/2014  . Pneumococcal Polysaccharide-23 05/30/2016  . Tdap 06/14/2013  . Zoster 07/29/2014    Health Maintenance  Topic Date Due  . Samul Dada  06/15/2023  . INFLUENZA VACCINE  Completed  . DEXA SCAN  Completed  . PNA vac Low Risk Adult  Completed    Lab Results  Component Value Date   WBC 6.3 07/04/2017   HGB 13.7 07/04/2017   HCT 41.7 07/04/2017   PLT 222.0 07/04/2017   GLUCOSE 94 05/05/2017   CHOL 160 07/04/2017   TRIG 157.0 (H) 07/04/2017   HDL 82.50 07/04/2017   LDLCALC 46 07/04/2017   ALT 16 05/05/2017   AST 15 05/05/2017   NA 139 05/05/2017  K 4.0 05/05/2017   CL 100 05/05/2017   CREATININE 0.87 05/05/2017   BUN 24 (H) 05/05/2017   CO2 30 05/05/2017   TSH 2.56 07/04/2017   HGBA1C 5.8 05/05/2017    Lab Results  Component Value Date   TSH 2.56 07/04/2017   Lab Results  Component Value Date   WBC 6.3 07/04/2017   HGB 13.7 07/04/2017   HCT 41.7 07/04/2017   MCV 89.1 07/04/2017   PLT 222.0 07/04/2017   Lab Results  Component Value Date   NA 139 05/05/2017   K 4.0 05/05/2017   CO2 30 05/05/2017   GLUCOSE 94 05/05/2017   BUN 24 (H) 05/05/2017   CREATININE 0.87 05/05/2017   BILITOT 0.5 05/05/2017   ALKPHOS 53 05/05/2017   AST 15 05/05/2017   ALT 16 05/05/2017   PROT 7.2 05/05/2017   ALBUMIN 4.1 05/05/2017   CALCIUM 9.5 05/05/2017   ANIONGAP 7 09/12/2016   GFR 65.97 05/05/2017   Lab Results  Component Value Date   CHOL 160 07/04/2017   Lab Results  Component Value Date   HDL 82.50 07/04/2017   Lab Results  Component Value Date   LDLCALC 46 07/04/2017   Lab Results  Component Value Date   TRIG 157.0 (H) 07/04/2017   Lab Results  Component Value Date   CHOLHDL 2 07/04/2017   Lab Results  Component Value Date   HGBA1C 5.8  05/05/2017         Assessment & Plan:   Problem List Items Addressed This Visit    Osteoporosis - Primary    Encouraged to get adequate exercise, calcium and vitamin d intake. Dexa scan ordered and reviewed. Continue prolia and Ca/Vitamin D supplements. Stay active      Relevant Medications   denosumab (PROLIA) injection 60 mg (Completed)   Other Relevant Orders   DG Bone Density (Completed)   Hyperlipidemia, mixed    Encouraged heart healthy diet, increase exercise, avoid trans fats, consider a krill oil cap daily      Essential hypertension    Well controlled, no changes to meds. Encouraged heart healthy diet such as the DASH diet and exercise as tolerated.       Hyperglycemia    hgba1c acceptable, minimize simple carbs. Increase exercise as tolerated.          I am having Jennifer Wilkins maintain her Vitamin B-12 CR, Calcium Carbonate-Vitamin D, Probiotic Product (PROBIOTIC DAILY PO), FIBER ADULT GUMMIES PO, cholecalciferol, polyethylene glycol, aspirin, multivitamin with minerals, HAIR/SKIN/NAILS/BIOTIN, OSTEO BI-FLEX ADV JOINT SHIELD, Turmeric, vitamin E, carvedilol, Methyl Salicylate-Lido-Menthol (LIDOCAINE OINT & MEN-METH SAL EX), Diclofenac Sodium, and hydrochlorothiazide. We administered denosumab.  Meds ordered this encounter  Medications  . denosumab (PROLIA) injection 60 mg    CMA served as scribe during this visit. History, Physical and Plan performed by medical provider. Documentation and orders reviewed and attested to.  Penni Homans, MD

## 2017-07-11 NOTE — Telephone Encounter (Signed)
Patient notified of results Prolia given today

## 2017-07-11 NOTE — Patient Instructions (Addendum)
Tylenol ES 500 mg tabs 2 tabs twice daily and can add 2 more tabs for a total of 6 tabs in 24 hours for severe pain Aspercreme LIdocaine version  Shingrix is the new shingles shot 2 shots over 2-6 months at [facility] to Lower Your Triglycerides Triglycerides are a type of fat in your blood. High levels of triglycerides can increase the risk of heart disease and stroke. If your triglyceride levels are high, the foods you eat and your eating habits are very important. Choosing the right foods can help lower your triglycerides. What general guidelines do I need to follow?  Lose weight if you are overweight.  Limit or avoid alcohol.  Fill one half of your plate with vegetables and green salads.  Limit fruit to two servings a day. Choose fruit instead of juice.  Make one fourth of your plate whole grains. Look for the word "whole" as the first word in the ingredient list.  Fill one fourth of your plate with lean protein foods.  Enjoy fatty fish (such as salmon, mackerel, sardines, and tuna) three times a week.  Choose healthy fats.  Limit foods high in starch and sugar.  Eat more home-cooked food and less restaurant, buffet, and fast food.  Limit fried foods.  Cook foods using methods other than frying.  Limit saturated fats.  Check ingredient lists to avoid foods with partially hydrogenated oils (trans fats) in them. What foods can I eat? Grains Whole grains, such as whole wheat or whole grain breads, crackers, cereals, and pasta. Unsweetened oatmeal, bulgur, barley, quinoa, or brown rice. Corn or whole wheat flour tortillas. Vegetables Fresh or frozen vegetables (raw, steamed, roasted, or grilled). Green salads. Fruits All fresh, canned (in natural juice), or frozen fruits. Meat and Other Protein Products Ground beef (85% or leaner), grass-fed beef, or beef trimmed of fat. Skinless chicken or [place]. Ground chicken or [place]. Pork trimmed of fat. All fish and  seafood. Eggs. Dried beans, peas, or lentils. Unsalted nuts or seeds. Unsalted canned or dry beans. Dairy Low-fat dairy products, such as skim or 1% milk, 2% or reduced-fat cheeses, low-fat ricotta or cottage cheese, or plain low-fat yogurt. Fats and Oils Tub margarines without trans fats. Light or reduced-fat mayonnaise and salad dressings. Avocado. Safflower, olive, or canola oils. Natural peanut or almond butter. The items listed above may not be a complete list of recommended foods or beverages. Contact your dietitian for more options. What foods are not recommended? Grains White bread. White pasta. White rice. Cornbread. Bagels, pastries, and croissants. Crackers that contain trans fat. Vegetables White potatoes. Corn. Creamed or fried vegetables. Vegetables in a cheese sauce. Fruits Dried fruits. Canned fruit in light or heavy syrup. Fruit juice. Meat and Other Protein Products Fatty cuts of meat. Ribs, chicken wings, bacon, sausage, bologna, salami, chitterlings, fatback, hot dogs, bratwurst, and packaged luncheon meats. Dairy Whole or 2% milk, cream, half-and-half, and cream cheese. Whole-fat or sweetened yogurt. Full-fat cheeses. Nondairy creamers and whipped toppings. Processed cheese, cheese spreads, or cheese curds. Sweets and Desserts Corn syrup, sugars, honey, and molasses. Candy. Jam and jelly. Syrup. Sweetened cereals. Cookies, pies, cakes, donuts, muffins, and ice cream. Fats and Oils Butter, stick margarine, lard, shortening, ghee, or bacon fat. Coconut, palm kernel, or palm oils. Beverages Alcohol. Sweetened drinks (such as sodas, lemonade, and fruit drinks or punches). The items listed above may not be a complete list of foods and beverages to avoid. Contact your dietitian for more information. This  information is not intended to replace advice given to you by your health care provider. Make sure you discuss any questions you have with your health care provider. Document  Released: 05/02/2004 Document Revised: 12/21/2015 Document Reviewed: 05/19/2013 Elsevier Interactive Patient Education  2017 [employer].

## 2017-07-13 NOTE — Assessment & Plan Note (Signed)
Well controlled, no changes to meds. Encouraged heart healthy diet such as the DASH diet and exercise as tolerated.  °

## 2017-07-13 NOTE — Assessment & Plan Note (Signed)
Encouraged heart healthy diet, increase exercise, avoid trans fats, consider a krill oil cap daily 

## 2017-07-13 NOTE — Assessment & Plan Note (Signed)
hgba1c acceptable, minimize simple carbs. Increase exercise as tolerated.  

## 2017-07-13 NOTE — Assessment & Plan Note (Signed)
Encouraged to get adequate exercise, calcium and vitamin d intake. Dexa scan ordered and reviewed. Continue prolia and Ca/Vitamin D supplements. Stay active

## 2017-07-16 ENCOUNTER — Other Ambulatory Visit: Payer: Self-pay | Admitting: Family Medicine

## 2017-07-16 MED ORDER — ZOSTER VAC RECOMB ADJUVANTED 50 MCG/0.5ML IM SUSR: 0.5000 mL | Freq: Once | INTRAMUSCULAR | 1 refills | Status: AC

## 2017-07-16 NOTE — Telephone Encounter (Signed)
Dr Charlett Blake-- I have pended Rx for shingrix if ok to send for pt?

## 2017-07-16 NOTE — Telephone Encounter (Signed)
Copied from [place]. Topic: Quick Communication - See Telephone Encounter >> Jul 16, 2017 10:27 AM Synthia Innocent wrote: CRM for notification. See Telephone encounter for:  Need prescription for shinrix faxed to [facility]  07/16/17.

## 2017-07-16 NOTE — Telephone Encounter (Signed)
Thanks

## 2017-08-25 ENCOUNTER — Telehealth: Payer: Self-pay | Admitting: Family Medicine

## 2017-08-25 MED ORDER — CARVEDILOL 12.5 MG PO TABS: 12.5000 mg | ORAL_TABLET | Freq: Every day | ORAL | 0 refills | Status: DC

## 2017-08-25 NOTE — Telephone Encounter (Signed)
Copied from [place]. Topic: Quick Communication - Rx Refill/Question >> Aug 25, 2017 10:18 AM Patrice Paradise wrote: Medication: carvedilol (COREG) 12.5 MG tablet    Has the patient contacted their pharmacy? Yes.     (Agent: If no, request that the patient contact the pharmacy for the refill.)   Preferred Pharmacy (with phone number or street name):  [facility], [place] [address] [place] [address] Phone: [phone] Fax: [phone]     Agent: Please be advised that RX refills may take up to 3 business days. We ask that you follow-up with your pharmacy.

## 2017-09-12 DIAGNOSIS — M5136 Other intervertebral disc degeneration, lumbar region: Secondary | ICD-10-CM | POA: Diagnosis not present

## 2017-09-12 DIAGNOSIS — Z981 Arthrodesis status: Secondary | ICD-10-CM | POA: Diagnosis not present

## 2017-10-31 ENCOUNTER — Telehealth: Payer: Self-pay | Admitting: Family Medicine

## 2017-10-31 MED ORDER — CARVEDILOL 12.5 MG PO TABS: 12.5000 mg | ORAL_TABLET | Freq: Every day | ORAL | 0 refills | Status: DC

## 2017-10-31 NOTE — Telephone Encounter (Signed)
Copied from [place] [phone]. Topic: Quick Communication - Rx Refill/Question >> Oct 31, 2017  3:21 PM Aurelio Brash B wrote: Medication: hydrochlorothiazide (HYDRODIURIL) 25 MG tablet  carvedilol (COREG) 12.5 MG tablet   Has the patient contacted their pharmacy? No she says she does mail order   (Agent: If no, request that the patient contact the pharmacy for the refill.)  Preferred Pharmacy (with phone number or street name): [facility], [place] [phone] (Phone) [phone] (Fax)   Agent: Please be advised that RX refills may take up to 3 business days. We ask that you follow-up with your pharmacy.

## 2017-10-31 NOTE — Telephone Encounter (Signed)
Pt advised to contact [facility] for refill of Hydrochlorothiazide. Prescription was sent in 06/2017 #90 tabs with one refill. Will send refill for Carvedilol (Coreg) 12.5mg  to [facility]. Pt has OV scheduled for 01/12/18.

## 2017-11-14 ENCOUNTER — Telehealth: Payer: Self-pay | Admitting: Family Medicine

## 2017-11-14 NOTE — Telephone Encounter (Signed)
Copied from [place] [id number]. Topic: Quick Communication - Rx Refill/Question >> Nov 14, 2017  8:36 AM Cleaster Corin, NT wrote: Medication: hydrochlorothiazide (HYDRODIURIL) 25 MG tablet [[id number]]  Has the patient contacted their pharmacy? no (Agent: If no, request that the patient contact the pharmacy for the refill.) Preferred Pharmacy (with phone number or street name): [place] [facility], [place] [address] [place] [address] Phone: [phone] Fax: [phone]   Agent: Please be advised that RX refills may take up to 3 business days. We ask that you follow-up with your pharmacy.

## 2017-11-14 NOTE — Telephone Encounter (Signed)
If patient is given a 90 day supply by her mail order- the number on the Rx needs to be corrected.( patient takes 2 tablets daily)  Called to pharmacy they are refilling the Rx for patient.

## 2017-11-20 MED ORDER — HYDROCHLOROTHIAZIDE 25 MG PO TABS: 25.0000 mg | ORAL_TABLET | Freq: Every day | ORAL | 0 refills | Status: DC

## 2017-11-20 NOTE — Telephone Encounter (Signed)
rx sent in with correct amount

## 2017-12-17 DIAGNOSIS — J3089 Other allergic rhinitis: Secondary | ICD-10-CM | POA: Diagnosis not present

## 2017-12-26 NOTE — Progress Notes (Signed)
Subjective:   Jennifer Wilkins is a 84 y.o. female who presents for Medicare Annual (Subsequent) preventive examination.  Review of Systems: No ROS.  Medicare Wellness Visit. Additional risk factors are reflected in the social history. Cardiac Risk Factors include: advanced age (>36men, >51 women);dyslipidemia;hypertension Sleep patterns: sleeps well per pt.  Home Safety/Smoke Alarms: Feels safe in home. Smoke alarms in place.  Living environment; residence and Firearm Safety: Lives at [facility] in independent living. Uses shower chair in walk in shower. Emergency cords to pull.    Female:      Mammo- declines       Dexa scan-  utd         Objective:     Vitals: BP 120/70 (BP Location: Left Arm, Patient Position: Sitting, Cuff Size: Normal)   Pulse 86   Ht 4\' 8"  (1.422 m)   Wt 115 lb 9.6 oz (52.4 kg)   SpO2 98%   BMI 25.92 kg/m   Body mass index is 25.92 kg/m.  Advanced Directives 01/02/2018 12/30/2016 09/11/2016 09/03/2016 04/25/2015  Does Patient Have a Medical Advance Directive? Yes Yes Yes Yes Yes  Type of Paramedic of [place];Living will [place];Living will [place];Living will [place];Living will [place];Living will  Does patient want to make changes to medical advance directive? - - No - Patient declined - Yes - information given  Copy of [place] in Chart? Yes No - copy requested No - copy requested No - copy requested No - copy requested    Tobacco Social History   Tobacco Use  Smoking Status Never Smoker  Smokeless Tobacco Never Used     Counseling given: Not Answered   Clinical Intake: Pain : No/denies pain   Past Medical History:  Diagnosis Date  . Anemia 09/30/2016  . Arthritis   . Elevated BP 04/21/2014  . HTN (hypertension)   . Hypercholesteremia   . Hypocalcemia 06/19/2013  . Low back pain radiating to both legs 08/27/2015  .  Medicare annual wellness visit, subsequent 05/03/2015  . Preventative health care 06/02/2016  . Stress reaction 04/25/2015   Past Surgical History:  Procedure Laterality Date  . CHOLECYSTECTOMY    . GALLBLADDER SURGERY  1984   Gallstone removal  . LUMBAR LAMINECTOMY/DECOMPRESSION MICRODISCECTOMY N/A 09/11/2016   Procedure: Microlumbar Decompression L3-4 and L4-5, lateral mass fusion at L4-5 using autologous, autograft bone;  Surgeon: Susa Day, MD;  Location: [facility];  Service: Orthopedics;  Laterality: N/A;   Family History  Problem Relation Age of Onset  . Osteoporosis Mother   . Ulcers Father   . Kidney disease Paternal Grandfather    Social History   Socioeconomic History  . Marital status: Married    Spouse name: Not on file  . Number of children: Not on file  . Years of education: Not on file  . Highest education level: Not on file  Occupational History  . Not on file  Social Needs  . Financial resource strain: Not on file  . Food insecurity:    Worry: Not on file    Inability: Not on file  . Transportation needs:    Medical: Not on file    Non-medical: Not on file  Tobacco Use  . Smoking status: Never Smoker  . Smokeless tobacco: Never Used  Substance and Sexual Activity  . Alcohol use: No  . Drug use: No  . Sexual activity: Not Currently  Lifestyle  . Physical activity:    Days per week: Not on file    Minutes per session: Not on file  . Stress: Not on file  Relationships  . Social connections:    Talks on phone: Not on file    Gets together: Not on file    Attends religious service: Not on file    Active member of club or organization: Not on file    Attends meetings of clubs or organizations: Not on file    Relationship status: Not on file  Other Topics Concern  . Not on file  Social History Narrative   Widowed July 2017    Outpatient Encounter Medications as of 01/02/2018  Medication Sig  . aspirin 81 MG tablet Take 1 tablet (81 mg total) by  mouth daily. Resume 4 days post-op  . Calcium Carbonate-Vitamin D (CALCIUM 600/VITAMIN D) 600-400 MG-UNIT per tablet Take 1 tablet by mouth 2 (two) times daily.  . carvedilol (COREG) 12.5 MG tablet Take 1 tablet (12.5 mg total) by mouth daily.  . cholecalciferol (VITAMIN D) 1000 units tablet Take 1,000 Units by mouth daily.  . Cyanocobalamin (VITAMIN B-12 CR) 1500 MCG TBCR Take by mouth daily.  Marland Kitchen FIBER ADULT GUMMIES PO Take 2 each by mouth.  . hydrochlorothiazide (HYDRODIURIL) 25 MG tablet Take 1 tablet (25 mg total) by mouth daily.  . Methyl Salicylate-Lido-Menthol (LIDOCAINE OINT & MEN-METH SAL EX) Apply topically.  . Misc Natural Products (OSTEO BI-FLEX ADV JOINT SHIELD) TABS Take 1 tablet by mouth daily. Resume 5 days post-op  . Multiple Vitamins-Minerals (HAIR/SKIN/NAILS/BIOTIN) TABS Take 1 tablet by mouth daily. Resume 5 days post-op  . Multiple Vitamins-Minerals (MULTIVITAMIN WITH MINERALS) tablet Take 1 tablet by mouth daily. Resume 5 days post-op  . Probiotic Product (PROBIOTIC DAILY PO) Take by mouth.  . Turmeric 500 MG CAPS Take 1 capsule by mouth daily. Resume 5 days post-op as needed  . vitamin E 400 UNIT capsule Take 1 capsule (400 Units total) by mouth daily. Resume 5 days post-op  . Diclofenac Sodium (PENNSAID) 2 % SOLN Place onto the skin.  . polyethylene glycol (MIRALAX / GLYCOLAX) packet Take 17 g by mouth daily. (Patient not taking: Reported on 01/02/2018)   No facility-administered encounter medications on file as of 01/02/2018.     Activities of Daily Living In your present state of health, do you have any difficulty performing the following activities: 01/02/2018  Hearing? Y  Comment wears hearing aids.  Vision? N  Difficulty concentrating or making decisions? N  Walking or climbing stairs? Y  Comment uses cane  Dressing or bathing? Y  Doing errands, shopping? Y  Comment Does not drive  Preparing Food and eating ? N  Using the Toilet? N  In the past six months, have  you accidently leaked urine? N  Do you have problems with loss of bowel control? N  Managing your Medications? N  Managing your Finances? N  Housekeeping or managing your Housekeeping? N  Some recent data might be hidden    Patient Care Team: Mosie Lukes, MD as PCP - General (Family Medicine) Marica Otter, OD (Optometry)    Assessment:   This is a routine wellness examination for [place]. Physical assessment deferred to PCP.  Exercise Activities and Dietary recommendations Current Exercise Habits: Home exercise routine, Type of exercise: walking, Time (Minutes): 30, Frequency (Times/Week): 7, Weekly Exercise (Minutes/Week): 210, Intensity: Mild Diet (meal preparation, eat out, water intake, caffeinated beverages, dairy products, fruits and vegetables):  well balanced, on average, 3 meals per day     Goals    . DIET - INCREASE WATER INTAKE       Fall Risk Fall Risk  01/02/2018 12/30/2016 05/28/2016 04/25/2015 10/14/2014  Falls in the past year? No No No No No     Depression Screen PHQ 2/9 Scores 01/02/2018 12/30/2016 05/28/2016 04/25/2015  PHQ - 2 Score 0 0 4 1  PHQ- 9 Score - - 4 -     Cognitive Function MMSE - Mini Mental State Exam 01/02/2018 12/30/2016  Orientation to time 5 5  Orientation to Place 5 5  Registration 3 3  Attention/ Calculation 5 5  Recall 3 3  Language- name 2 objects 2 2  Language- repeat 1 1  Language- follow 3 step command 3 3  Language- read & follow direction 1 1  Write a sentence 1 1  Copy design 1 1  Total score 30 30        Immunization History  Administered Date(s) Administered  . Influenza Split 04/24/2012  . Influenza, High Dose Seasonal PF 04/25/2015, 05/02/2017  . Influenza,inj,Quad PF,6+ Mos 04/21/2014, 05/02/2017  . Pneumococcal Conjugate-13 06/14/2013, 10/14/2014  . Pneumococcal Polysaccharide-23 05/30/2016  . Tdap 06/14/2013  . Zoster 07/29/2014    Screening Tests Health Maintenance  Topic Date Due  . INFLUENZA VACCINE   02/26/2018  . TETANUS/TDAP  06/15/2023  . DEXA SCAN  Completed  . PNA vac Low Risk Adult  Completed       Plan:    Please schedule your next medicare wellness visit with me in 1 yr.  Continue to eat heart healthy diet (full of fruits, vegetables, whole grains, lean protein, water--limit salt, fat, and sugar intake) and increase physical activity as tolerated.  Continue doing brain stimulating activities (puzzles, reading, adult coloring books, staying active) to keep memory sharp.     I have personally reviewed and noted the following in the patient's chart:   . Medical and social history . Use of alcohol, tobacco or illicit drugs  . Current medications and supplements . Functional ability and status . Nutritional status . Physical activity . Advanced directives . List of other physicians . Hospitalizations, surgeries, and ER visits in previous 12 months . Vitals . Screenings to include cognitive, depression, and falls . Referrals and appointments  In addition, I have reviewed and discussed with patient certain preventive protocols, quality metrics, and best practice recommendations. A written personalized care plan for preventive services as well as general preventive health recommendations were provided to patient.     Shela Nevin, [place]  01/02/2018

## 2017-12-29 ENCOUNTER — Telehealth: Payer: Self-pay | Admitting: Family Medicine

## 2017-12-29 NOTE — Telephone Encounter (Signed)
[employer] benefits received PA not required Secondary Tricare will cover   Patient may owe approximately $0 OOP  Patient due after 01/07/18  Letter mailed to inform patient of benefits and to schedule

## 2017-12-31 ENCOUNTER — Ambulatory Visit: Payer: Medicare Other | Admitting: *Deleted

## 2018-01-02 ENCOUNTER — Encounter: Payer: Self-pay | Admitting: *Deleted

## 2018-01-02 ENCOUNTER — Ambulatory Visit (INDEPENDENT_AMBULATORY_CARE_PROVIDER_SITE_OTHER): Payer: Medicare Other | Admitting: *Deleted

## 2018-01-02 VITALS — BP 120/70 | HR 86 | Ht <= 58 in | Wt 115.6 lb

## 2018-01-02 DIAGNOSIS — Z0000 Encounter for general adult medical examination without abnormal findings: Secondary | ICD-10-CM

## 2018-01-02 NOTE — Progress Notes (Signed)
Noted. Agree with above.  [place], DO 01/02/18 2:44 PM

## 2018-01-02 NOTE — Patient Instructions (Signed)
Please schedule your next medicare wellness visit with me in 1 yr.  Continue to eat heart healthy diet (full of fruits, vegetables, whole grains, lean protein, water--limit salt, fat, and sugar intake) and increase physical activity as tolerated.  Continue doing brain stimulating activities (puzzles, reading, adult coloring books, staying active) to keep memory sharp.    Jennifer Wilkins , Thank you for taking time to come for your Medicare Wellness Visit. I appreciate your ongoing commitment to your health goals. Please review the following plan we discussed and let me know if I can assist you in the future.   These are the goals we discussed: Goals    . DIET - INCREASE WATER INTAKE       This is a list of the screening recommended for you and due dates:  Health Maintenance  Topic Date Due  . Flu Shot  02/26/2018  . Tetanus Vaccine  06/15/2023  . DEXA scan (bone density measurement)  Completed  . Pneumonia vaccines  Completed     Health Maintenance for Postmenopausal Women Menopause is a normal process in which your reproductive ability comes to an end. This process happens gradually over a span of months to years, usually between the ages of 61 and 57. Menopause is complete when you have missed 12 consecutive menstrual periods. It is important to talk with your health care provider about some of the most common conditions that affect postmenopausal women, such as heart disease, cancer, and bone loss (osteoporosis). Adopting a healthy lifestyle and getting preventive care can help to promote your health and wellness. Those actions can also lower your chances of developing some of these common conditions. What should I know about menopause? During menopause, you may experience a number of symptoms, such as:  Moderate-to-severe hot flashes.  Night sweats.  Decrease in sex drive.  Mood swings.  Headaches.  Tiredness.  Irritability.  Memory problems.  Insomnia.  Choosing to  treat or not to treat menopausal changes is an individual decision that you make with your health care provider. What should I know about hormone replacement therapy and supplements? Hormone therapy products are effective for treating symptoms that are associated with menopause, such as hot flashes and night sweats. Hormone replacement carries certain risks, especially as you become older. If you are thinking about using estrogen or estrogen with progestin treatments, discuss the benefits and risks with your health care provider. What should I know about heart disease and stroke? Heart disease, heart attack, and stroke become more likely as you age. This may be due, in part, to the hormonal changes that your body experiences during menopause. These can affect how your body processes dietary fats, triglycerides, and cholesterol. Heart attack and stroke are both medical emergencies. There are many things that you can do to help prevent heart disease and stroke:  Have your blood pressure checked at least every 1-2 years. High blood pressure causes heart disease and increases the risk of stroke.  If you are 61-68 years old, ask your health care provider if you should take aspirin to prevent a heart attack or a stroke.  Do not use any tobacco products, including cigarettes, chewing tobacco, or electronic cigarettes. If you need help quitting, ask your health care provider.  It is important to eat a healthy diet and maintain a healthy weight. ? Be sure to include plenty of vegetables, fruits, low-fat dairy products, and lean protein. ? Avoid eating foods that are high in solid fats, added sugars, or  salt (sodium).  Get regular exercise. This is one of the most important things that you can do for your health. ? Try to exercise for at least 150 minutes each week. The type of exercise that you do should increase your heart rate and make you sweat. This is known as moderate-intensity exercise. ? Try to do  strengthening exercises at least twice each week. Do these in addition to the moderate-intensity exercise.  Know your numbers.Ask your health care provider to check your cholesterol and your blood glucose. Continue to have your blood tested as directed by your health care provider.  What should I know about cancer screening? There are several types of cancer. Take the following steps to reduce your risk and to catch any cancer development as early as possible. Breast Cancer  Practice breast self-awareness. ? This means understanding how your breasts normally appear and feel. ? It also means doing regular breast self-exams. Let your health care provider know about any changes, no matter how small.  If you are 17 or older, have a clinician do a breast exam (clinical breast exam or CBE) every year. Depending on your age, family history, and medical history, it may be recommended that you also have a yearly breast X-ray (mammogram).  If you have a family history of breast cancer, talk with your health care provider about genetic screening.  If you are at high risk for breast cancer, talk with your health care provider about having an MRI and a mammogram every year.  Breast cancer (BRCA) gene test is recommended for women who have family members with BRCA-related cancers. Results of the assessment will determine the need for genetic counseling and BRCA1 and for BRCA2 testing. BRCA-related cancers include these types: ? Breast. This occurs in males or females. ? Ovarian. ? Tubal. This may also be called fallopian tube cancer. ? Cancer of the abdominal or pelvic lining (peritoneal cancer). ? Prostate. ? Pancreatic.  Cervical, Uterine, and Ovarian Cancer Your health care provider may recommend that you be screened regularly for cancer of the pelvic organs. These include your ovaries, uterus, and vagina. This screening involves a pelvic exam, which includes checking for microscopic changes to the  surface of your cervix (Pap test).  For women ages 21-65, health care providers may recommend a pelvic exam and a Pap test every three years. For women ages 77-65, they may recommend the Pap test and pelvic exam, combined with testing for human papilloma virus (HPV), every five years. Some types of HPV increase your risk of cervical cancer. Testing for HPV may also be done on women of any age who have unclear Pap test results.  Other health care providers may not recommend any screening for nonpregnant women who are considered low risk for pelvic cancer and have no symptoms. Ask your health care provider if a screening pelvic exam is right for you.  If you have had past treatment for cervical cancer or a condition that could lead to cancer, you need Pap tests and screening for cancer for at least 20 years after your treatment. If Pap tests have been discontinued for you, your risk factors (such as having a new sexual partner) need to be reassessed to determine if you should start having screenings again. Some women have medical problems that increase the chance of getting cervical cancer. In these cases, your health care provider may recommend that you have screening and Pap tests more often.  If you have a family history of uterine  cancer or ovarian cancer, talk with your health care provider about genetic screening.  If you have vaginal bleeding after reaching menopause, tell your health care provider.  There are currently no reliable tests available to screen for ovarian cancer.  Lung Cancer Lung cancer screening is recommended for adults 26-64 years old who are at high risk for lung cancer because of a history of smoking. A yearly low-dose CT scan of the lungs is recommended if you:  Currently smoke.  Have a history of at least 30 pack-years of smoking and you currently smoke or have quit within the past 15 years. A pack-year is smoking an average of one pack of cigarettes per day for one  year.  Yearly screening should:  Continue until it has been 15 years since you quit.  Stop if you develop a health problem that would prevent you from having lung cancer treatment.  Colorectal Cancer  This type of cancer can be detected and can often be prevented.  Routine colorectal cancer screening usually begins at age 64 and continues through age 37.  If you have risk factors for colon cancer, your health care provider may recommend that you be screened at an earlier age.  If you have a family history of colorectal cancer, talk with your health care provider about genetic screening.  Your health care provider may also recommend using home test kits to check for hidden blood in your stool.  A small camera at the end of a tube can be used to examine your colon directly (sigmoidoscopy or colonoscopy). This is done to check for the earliest forms of colorectal cancer.  Direct examination of the colon should be repeated every 5-10 years until age 62. However, if early forms of precancerous polyps or small growths are found or if you have a family history or genetic risk for colorectal cancer, you may need to be screened more often.  Skin Cancer  Check your skin from head to toe regularly.  Monitor any moles. Be sure to tell your health care provider: ? About any new moles or changes in moles, especially if there is a change in a mole's shape or color. ? If you have a mole that is larger than the size of a pencil eraser.  If any of your family members has a history of skin cancer, especially at a young age, talk with your health care provider about genetic screening.  Always use sunscreen. Apply sunscreen liberally and repeatedly throughout the day.  Whenever you are outside, protect yourself by wearing long sleeves, pants, a wide-brimmed hat, and sunglasses.  What should I know about osteoporosis? Osteoporosis is a condition in which bone destruction happens more quickly than  new bone creation. After menopause, you may be at an increased risk for osteoporosis. To help prevent osteoporosis or the bone fractures that can happen because of osteoporosis, the following is recommended:  If you are 53-16 years old, get at least 1,000 mg of calcium and at least 600 mg of vitamin D per day.  If you are older than age 36 but younger than age 42, get at least 1,200 mg of calcium and at least 600 mg of vitamin D per day.  If you are older than age 80, get at least 1,200 mg of calcium and at least 800 mg of vitamin D per day.  Smoking and excessive alcohol intake increase the risk of osteoporosis. Eat foods that are rich in calcium and vitamin D, and do weight-bearing  exercises several times each week as directed by your health care provider. What should I know about how menopause affects my mental health? Depression may occur at any age, but it is more common as you become older. Common symptoms of depression include:  Low or sad mood.  Changes in sleep patterns.  Changes in appetite or eating patterns.  Feeling an overall lack of motivation or enjoyment of activities that you previously enjoyed.  Frequent crying spells.  Talk with your health care provider if you think that you are experiencing depression. What should I know about immunizations? It is important that you get and maintain your immunizations. These include:  Tetanus, diphtheria, and pertussis (Tdap) booster vaccine.  Influenza every year before the flu season begins.  Pneumonia vaccine.  Shingles vaccine.  Your health care provider may also recommend other immunizations. This information is not intended to replace advice given to you by your health care provider. Make sure you discuss any questions you have with your health care provider. Document Released: 09/06/2005 Document Revised: 02/02/2016 Document Reviewed: 04/18/2015 Elsevier Interactive Patient Education  2018 [employer].

## 2018-01-05 ENCOUNTER — Other Ambulatory Visit: Payer: Self-pay | Admitting: Emergency Medicine

## 2018-01-05 ENCOUNTER — Other Ambulatory Visit (INDEPENDENT_AMBULATORY_CARE_PROVIDER_SITE_OTHER): Payer: Medicare Other

## 2018-01-05 DIAGNOSIS — I10 Essential (primary) hypertension: Secondary | ICD-10-CM | POA: Diagnosis not present

## 2018-01-05 LAB — BASIC METABOLIC PANEL
BUN: 20 mg/dL (ref 6–23)
CHLORIDE: 100 meq/L (ref 96–112)
CO2: 30 meq/L (ref 19–32)
CREATININE: 0.8 mg/dL (ref 0.40–1.20)
Calcium: 9.6 mg/dL (ref 8.4–10.5)
GFR: 72.56 mL/min (ref >60.00)
Glucose, Bld: 111 mg/dL — ABNORMAL HIGH (ref 70–99)
POTASSIUM: 4.4 meq/L (ref 3.5–5.1)
SODIUM: 139 meq/L (ref 135–145)

## 2018-01-12 ENCOUNTER — Encounter: Payer: Self-pay | Admitting: Family Medicine

## 2018-01-12 ENCOUNTER — Telehealth: Payer: Self-pay

## 2018-01-12 ENCOUNTER — Telehealth: Payer: Self-pay | Admitting: Internal Medicine

## 2018-01-12 ENCOUNTER — Ambulatory Visit (INDEPENDENT_AMBULATORY_CARE_PROVIDER_SITE_OTHER): Payer: Medicare Other | Admitting: Family Medicine

## 2018-01-12 DIAGNOSIS — R739 Hyperglycemia, unspecified: Secondary | ICD-10-CM | POA: Diagnosis not present

## 2018-01-12 DIAGNOSIS — E559 Vitamin D deficiency, unspecified: Secondary | ICD-10-CM

## 2018-01-12 DIAGNOSIS — I10 Essential (primary) hypertension: Secondary | ICD-10-CM

## 2018-01-12 DIAGNOSIS — M48061 Spinal stenosis, lumbar region without neurogenic claudication: Secondary | ICD-10-CM

## 2018-01-12 DIAGNOSIS — E782 Mixed hyperlipidemia: Secondary | ICD-10-CM

## 2018-01-12 DIAGNOSIS — M810 Age-related osteoporosis without current pathological fracture: Secondary | ICD-10-CM | POA: Diagnosis not present

## 2018-01-12 LAB — LIPID PANEL
CHOL/HDL RATIO: 2
Cholesterol: 169 mg/dL (ref 0–200)
HDL: 93.5 mg/dL (ref >39.00)
LDL Cholesterol: 55 mg/dL (ref 0–99)
NONHDL: 75.97
Triglycerides: 106 mg/dL (ref 0.0–149.0)
VLDL: 21.2 mg/dL (ref 0.0–40.0)

## 2018-01-12 LAB — VITAMIN D 25 HYDROXY (VIT D DEFICIENCY, FRACTURES): VITD: 102.77 ng/mL (ref 30.00–100.00)

## 2018-01-12 LAB — CBC
HEMATOCRIT: 39.8 % (ref 36.0–46.0)
Hemoglobin: 13.3 g/dL (ref 12.0–15.0)
MCHC: 33.4 g/dL (ref 30.0–36.0)
MCV: 88.5 fl (ref 78.0–100.0)
Platelets: 193 10*3/uL (ref 150.0–400.0)
RBC: 4.5 Mil/uL (ref 3.87–5.11)
RDW: 15.2 % (ref 11.5–15.5)
WBC: 4.2 10*3/uL (ref 4.0–10.5)

## 2018-01-12 LAB — COMPREHENSIVE METABOLIC PANEL
ALT: 15 U/L (ref 0–35)
AST: 18 U/L (ref 0–37)
Albumin: 4.2 g/dL (ref 3.5–5.2)
Alkaline Phosphatase: 56 U/L (ref 39–117)
BILIRUBIN TOTAL: 0.8 mg/dL (ref 0.2–1.2)
BUN: 23 mg/dL (ref 6–23)
CO2: 31 meq/L (ref 19–32)
CREATININE: 0.82 mg/dL (ref 0.40–1.20)
Calcium: 9.8 mg/dL (ref 8.4–10.5)
Chloride: 100 mEq/L (ref 96–112)
GFR: 70.51 mL/min (ref >60.00)
GLUCOSE: 97 mg/dL (ref 70–99)
Potassium: 4.3 mEq/L (ref 3.5–5.1)
Sodium: 141 mEq/L (ref 135–145)
Total Protein: 6.8 g/dL (ref 6.0–8.3)

## 2018-01-12 LAB — HEMOGLOBIN A1C: Hgb A1c MFr Bld: 6 % (ref 4.6–6.5)

## 2018-01-12 LAB — TSH: TSH: 1.4 u[IU]/mL (ref 0.35–4.50)

## 2018-01-12 MED ORDER — DENOSUMAB 60 MG/ML ~~LOC~~ SOSY
60.0000 mg | PREFILLED_SYRINGE | Freq: Once | SUBCUTANEOUS | Status: AC
  Administered 2018-01-12: 60 mg via SUBCUTANEOUS

## 2018-01-12 NOTE — Assessment & Plan Note (Signed)
Encouraged heart healthy diet, increase exercise, avoid trans fats, consider a krill oil cap daily 

## 2018-01-12 NOTE — Assessment & Plan Note (Signed)
hgba1c acceptable, minimize simple carbs. Increase exercise as tolerated.  

## 2018-01-12 NOTE — Progress Notes (Signed)
Subjective:  I acted as a Education administrator for Dr. Charlett Blake. [place], [place]  Patient ID: Jennifer Wilkins, female    DOB: May 15, 1932, 84 y.o.   MRN: [id number]  No chief complaint on file.   HPI  Patient is in today for 6 month follow up and doing well she had a flare of allergies after working in her daughter's yard but it calmed down with antihistamines. No recent febrile illness or hospitalizations. Denies CP/palp/SOB/HA/congestion/fevers/GI or GU c/o. Taking meds as prescribed. Her back pain is mangeable with Tylenol qhs  Patient Care Team: Mosie Lukes, MD as PCP - General (Family Medicine) Marica Otter, OD (Optometry)   Past Medical History:  Diagnosis Date  . Anemia 09/30/2016  . Arthritis   . Elevated BP 04/21/2014  . HTN (hypertension)   . Hypercholesteremia   . Hypocalcemia 06/19/2013  . Low back pain radiating to both legs 08/27/2015  . Medicare annual wellness visit, subsequent 05/03/2015  . Preventative health care 06/02/2016  . Stress reaction 04/25/2015    Past Surgical History:  Procedure Laterality Date  . CHOLECYSTECTOMY    . GALLBLADDER SURGERY  1984   Gallstone removal  . LUMBAR LAMINECTOMY/DECOMPRESSION MICRODISCECTOMY N/A 09/11/2016   Procedure: Microlumbar Decompression L3-4 and L4-5, lateral mass fusion at L4-5 using autologous, autograft bone;  Surgeon: Susa Day, MD;  Location: [facility];  Service: Orthopedics;  Laterality: N/A;    Family History  Problem Relation Age of Onset  . Osteoporosis Mother   . Ulcers Father   . Kidney disease Paternal Grandfather     Social History   Socioeconomic History  . Marital status: Married    Spouse name: Not on file  . Number of children: Not on file  . Years of education: Not on file  . Highest education level: Not on file  Occupational History  . Not on file  Social Needs  . Financial resource strain: Not on file  . Food insecurity:    Worry: Not on file    Inability: Not on file  . Transportation needs:   Medical: Not on file    Non-medical: Not on file  Tobacco Use  . Smoking status: Never Smoker  . Smokeless tobacco: Never Used  Substance and Sexual Activity  . Alcohol use: No  . Drug use: No  . Sexual activity: Not Currently  Lifestyle  . Physical activity:    Days per week: Not on file    Minutes per session: Not on file  . Stress: Not on file  Relationships  . Social connections:    Talks on phone: Not on file    Gets together: Not on file    Attends religious service: Not on file    Active member of club or organization: Not on file    Attends meetings of clubs or organizations: Not on file    Relationship status: Not on file  . Intimate partner violence:    Fear of current or ex partner: Not on file    Emotionally abused: Not on file    Physically abused: Not on file    Forced sexual activity: Not on file  Other Topics Concern  . Not on file  Social History Narrative   Widowed July 2017    Outpatient Medications Prior to Visit  Medication Sig Dispense Refill  . aspirin 81 MG tablet Take 1 tablet (81 mg total) by mouth daily. Resume 4 days post-op 30 tablet   . Calcium Carbonate-Vitamin D (CALCIUM 600/VITAMIN D)  600-400 MG-UNIT per tablet Take 1 tablet by mouth 2 (two) times daily.    . carvedilol (COREG) 12.5 MG tablet Take 1 tablet (12.5 mg total) by mouth daily. 90 tablet 0  . cholecalciferol (VITAMIN D) 1000 units tablet Take 1,000 Units by mouth daily.    . Cyanocobalamin (VITAMIN B-12 CR) 1500 MCG TBCR Take by mouth daily.    . Diclofenac Sodium (PENNSAID) 2 % SOLN Place onto the skin.    Marland Kitchen FIBER ADULT GUMMIES PO Take 2 each by mouth.    . hydrochlorothiazide (HYDRODIURIL) 25 MG tablet Take 1 tablet (25 mg total) by mouth daily. 180 tablet 0  . Methyl Salicylate-Lido-Menthol (LIDOCAINE OINT & MEN-METH SAL EX) Apply topically.    . Misc Natural Products (OSTEO BI-FLEX ADV JOINT SHIELD) TABS Take 1 tablet by mouth daily. Resume 5 days post-op    . Multiple  Vitamins-Minerals (HAIR/SKIN/NAILS/BIOTIN) TABS Take 1 tablet by mouth daily. Resume 5 days post-op    . Multiple Vitamins-Minerals (MULTIVITAMIN WITH MINERALS) tablet Take 1 tablet by mouth daily. Resume 5 days post-op    . polyethylene glycol (MIRALAX / GLYCOLAX) packet Take 17 g by mouth daily. (Patient not taking: Reported on 01/02/2018) 14 each 0  . Probiotic Product (PROBIOTIC DAILY PO) Take by mouth.    . Turmeric 500 MG CAPS Take 1 capsule by mouth daily. Resume 5 days post-op as needed    . vitamin E 400 UNIT capsule Take 1 capsule (400 Units total) by mouth daily. Resume 5 days post-op     No facility-administered medications prior to visit.     No Known Allergies  Review of Systems  Constitutional: Negative for fever and malaise/fatigue.  HENT: Negative for congestion.   Eyes: Negative for blurred vision.  Respiratory: Negative for shortness of breath.   Cardiovascular: Negative for chest pain, palpitations and leg swelling.  Gastrointestinal: Negative for abdominal pain, blood in stool and nausea.  Genitourinary: Negative for dysuria and frequency.  Musculoskeletal: Positive for back pain. Negative for falls.  Skin: Negative for rash.  Neurological: Negative for dizziness, loss of consciousness and headaches.  Endo/Heme/Allergies: Negative for environmental allergies.  Psychiatric/Behavioral: Negative for depression. The patient is not nervous/anxious.        Objective:    Physical Exam  Constitutional: She is oriented to person, place, and time. She appears well-developed and well-nourished. No distress.  HENT:  Head: Normocephalic and atraumatic.  Nose: Nose normal.  Eyes: Right eye exhibits no discharge. Left eye exhibits no discharge.  Neck: Normal range of motion. Neck supple.  Cardiovascular: Normal rate and regular rhythm.  No murmur heard. Pulmonary/Chest: Effort normal and breath sounds normal.  Abdominal: Soft. Bowel sounds are normal. There is no  tenderness.  Musculoskeletal: She exhibits no edema.  Neurological: She is alert and oriented to person, place, and time.  Skin: Skin is warm and dry.  Psychiatric: She has a normal mood and affect.  Nursing note and vitals reviewed.   BP 108/72 (BP Location: Left Arm, Patient Position: Sitting, Cuff Size: Normal)   Pulse 78   Temp (!) 97.5 F (36.4 C) (Oral)   Resp 18   Wt 114 lb (51.7 kg)   SpO2 98%   BMI 25.56 kg/m  Wt Readings from Last 3 Encounters:  01/12/18 114 lb (51.7 kg)  01/02/18 115 lb 9.6 oz (52.4 kg)  07/11/17 117 lb 6.4 oz (53.3 kg)   BP Readings from Last 3 Encounters:  01/12/18 108/72  01/02/18 120/70  07/11/17 122/78     Immunization History  Administered Date(s) Administered  . Influenza Split 04/24/2012  . Influenza, High Dose Seasonal PF 04/25/2015, 05/02/2017  . Influenza,inj,Quad PF,6+ Mos 04/21/2014, 05/02/2017  . Pneumococcal Conjugate-13 06/14/2013, 10/14/2014  . Pneumococcal Polysaccharide-23 05/30/2016  . Tdap 06/14/2013  . Zoster 07/29/2014    Health Maintenance  Topic Date Due  . INFLUENZA VACCINE  02/26/2018  . TETANUS/TDAP  06/15/2023  . DEXA SCAN  Completed  . PNA vac Low Risk Adult  Completed    Lab Results  Component Value Date   WBC 6.3 07/04/2017   HGB 13.7 07/04/2017   HCT 41.7 07/04/2017   PLT 222.0 07/04/2017   GLUCOSE 111 (H) 01/05/2018   CHOL 160 07/04/2017   TRIG 157.0 (H) 07/04/2017   HDL 82.50 07/04/2017   LDLCALC 46 07/04/2017   ALT 16 05/05/2017   AST 15 05/05/2017   NA 139 01/05/2018   K 4.4 01/05/2018   CL 100 01/05/2018   CREATININE 0.80 01/05/2018   BUN 20 01/05/2018   CO2 30 01/05/2018   TSH 2.56 07/04/2017   HGBA1C 5.8 05/05/2017    Lab Results  Component Value Date   TSH 2.56 07/04/2017   Lab Results  Component Value Date   WBC 6.3 07/04/2017   HGB 13.7 07/04/2017   HCT 41.7 07/04/2017   MCV 89.1 07/04/2017   PLT 222.0 07/04/2017   Lab Results  Component Value Date   NA 139  01/05/2018   K 4.4 01/05/2018   CO2 30 01/05/2018   GLUCOSE 111 (H) 01/05/2018   BUN 20 01/05/2018   CREATININE 0.80 01/05/2018   BILITOT 0.5 05/05/2017   ALKPHOS 53 05/05/2017   AST 15 05/05/2017   ALT 16 05/05/2017   PROT 7.2 05/05/2017   ALBUMIN 4.1 05/05/2017   CALCIUM 9.6 01/05/2018   ANIONGAP 7 09/12/2016   GFR 72.56 01/05/2018   Lab Results  Component Value Date   CHOL 160 07/04/2017   Lab Results  Component Value Date   HDL 82.50 07/04/2017   Lab Results  Component Value Date   LDLCALC 46 07/04/2017   Lab Results  Component Value Date   TRIG 157.0 (H) 07/04/2017   Lab Results  Component Value Date   CHOLHDL 2 07/04/2017   Lab Results  Component Value Date   HGBA1C 5.8 05/05/2017         Assessment & Plan:   Problem List Items Addressed This Visit    Osteoporosis    Prolia given today      Hyperlipidemia, mixed    Encouraged heart healthy diet, increase exercise, avoid trans fats, consider a krill oil cap daily      Relevant Orders   Lipid panel   Vitamin D deficiency    Doing well on 5000 IU daily, will check level today      Relevant Orders   VITAMIN D 25 Hydroxy (Vit-D Deficiency, Fractures)   Essential hypertension    Well controlled, no changes to meds. Encouraged heart healthy diet such as the DASH diet and exercise as tolerated.       Relevant Orders   CBC   Comprehensive metabolic panel   TSH   Spinal stenosis of lumbar region    Staying active and managing with just one tylenol daily at bedtime.       Hyperglycemia    hgba1c acceptable, minimize simple carbs. Increase exercise as tolerated.       Relevant Orders   Hemoglobin A1c  I am having [name] maintain her Vitamin B-12 CR, Calcium Carbonate-Vitamin D, Probiotic Product (PROBIOTIC DAILY PO), FIBER ADULT GUMMIES PO, cholecalciferol, polyethylene glycol, aspirin, multivitamin with minerals, HAIR/SKIN/NAILS/BIOTIN, OSTEO BI-FLEX ADV JOINT SHIELD, Turmeric,  vitamin E, Methyl Salicylate-Lido-Menthol (LIDOCAINE OINT & MEN-METH SAL EX), Diclofenac Sodium, carvedilol, and hydrochlorothiazide.  No orders of the defined types were placed in this encounter.   CMA served as Education administrator during this visit. History, Physical and Plan performed by medical provider. Documentation and orders reviewed and attested to.  Penni Homans, MD

## 2018-01-12 NOTE — Assessment & Plan Note (Signed)
Well controlled, no changes to meds. Encouraged heart healthy diet such as the DASH diet and exercise as tolerated.  °

## 2018-01-12 NOTE — Assessment & Plan Note (Addendum)
Doing well on 5000 IU daily, will check level today

## 2018-01-12 NOTE — Patient Instructions (Signed)
For allergies can try using nasal saline to flush the nose after being outside  Flonase nasal spray is good allergies Cetirizine/Zyrtec 10 mg daily as needed Allergies, Adult An allergy is when your body's defense system (immune system) overreacts to an otherwise harmless substance (allergen) that you breathe in or eat or something that touches your skin. When you come into contact with something that you are allergic to, your immune system produces certain proteins (antibodies). These proteins cause cells to release chemicals (histamines) that trigger the symptoms of an allergic reaction. Allergies often affect the nasal passages (allergic rhinitis), eyes (allergic conjunctivitis), skin (atopic dermatitis), and stomach. Allergies can be mild or severe. Allergies cannot spread from person to person (are not contagious). They can develop at any age and may be outgrown. What increases the risk? You may be at greater risk of allergies if other people in your family have allergies. What are the signs or symptoms? Symptoms depend on what type of allergy you have. They may include:  Runny, stuffy nose.  Sneezing.  Itchy mouth, ears, or throat.  Postnasal drip.  Sore throat.  Itchy, red, watery, or puffy eyes.  Skin rash or hives.  Stomach pain.  Vomiting.  Diarrhea.  Bloating.  Wheezing or coughing.  People with a severe allergy to food, medicine, or an insect bite may have a life-threatening allergic reaction (anaphylaxis). Symptoms of anaphylaxis include:  Hives.  Itching.  Flushed face.  Swollen lips, tongue, or mouth.  Tight or swollen throat.  Chest pain or tightness in the chest.  Trouble breathing or shortness of breath.  Rapid heartbeat.  Dizziness or fainting.  Vomiting.  Diarrhea.  Pain in the abdomen.  How is this diagnosed? This condition is diagnosed based on:  Your symptoms.  Your family and medical history.  A physical exam.  You may need  to see a health care provider who specializes in treating allergies (allergist). You may also have tests, including:  Skin tests to see which allergens are causing your symptoms, such as: ? Skin prick test. In this test, your skin is pricked with a tiny needle and exposed to small amounts of possible allergens to see if your skin reacts. ? Intradermal skin test. In this test, a small amount of allergen is injected under your skin to see if your skin reacts. ? Patch test. In this test, a small amount of allergen is placed on your skin and then your skin is covered with a bandage. Your health care provider will check your skin after a couple of days to see if a rash has developed.  Blood tests.  Challenges tests. In this test, you inhale a small amount of allergen by mouth to see if you have an allergic reaction.  You may also be asked to:  Keep a food diary. A food diary is a record of all the foods and drinks you have in a day and any symptoms you experience.  Practice an elimination diet. An elimination diet involves eliminating specific foods from your diet and then adding them back in one by one to find out if a certain food causes an allergic reaction.  How is this treated? Treatment for allergies depends on your symptoms. Treatment may include:  Cold compresses to soothe itching and swelling.  Eye drops.  Nasal sprays.  Using a saline spray or container (neti pot) to flush out the nose (nasal irrigation). These methods can help clear away mucus and keep the nasal passages moist.  Using a humidifier.  Oral antihistamines or other medicines to block allergic reaction and inflammation.  Skin creams to treat rashes or itching.  Diet changes to eliminate food allergy triggers.  Repeated exposure to tiny amounts of allergens to build up a tolerance and prevent future allergic reactions (immunotherapy). These include: ? Allergy shots. ? Oral treatment. This involves taking small  doses of an allergen under the tongue (sublingual immunotherapy).  Emergency epinephrine injection (auto-injector) in case of an allergic emergency. This is a self-injectable, pre-measured medicine that must be given within the first few minutes of a serious allergic reaction.  Follow these instructions at home:  Avoid known allergens whenever possible.  If you suffer from airborne allergens, wash out your nose daily. You can do this with a saline spray or a neti pot to flush out your nose (nasal irrigation).  Take over-the-counter and prescription medicines only as told by your health care provider.  Keep all follow-up visits as told by your health care provider. This is important.  If you are at risk of a severe allergic reaction (anaphylaxis), keep your auto-injector with you at all times.  If you have ever had anaphylaxis, wear a medical alert bracelet or necklace that states you have a severe allergy. Contact a health care provider if:  Your symptoms do not improve with treatment. Get help right away if:  You have symptoms of anaphylaxis, such as: ? Swollen mouth, tongue, or throat. ? Pain or tightness in your chest. ? Trouble breathing or shortness of breath. ? Dizziness or fainting. ? Severe abdominal pain, vomiting, or diarrhea. This information is not intended to replace advice given to you by your health care provider. Make sure you discuss any questions you have with your health care provider. Document Released: 10/08/2002 Document Revised: 11/13/2016 Document Reviewed: 01/31/2016 Elsevier Interactive Patient Education  [employer].

## 2018-01-12 NOTE — Telephone Encounter (Signed)
Author received critical vitamin D level report of 102.77. Author consulted Debbrah Alar, NP, as PCP unavailable, and NP advised for pt. To stop taking all vitamin D supplementation and route to Dr. Charlett Blake for follow-up. Author phoned pt. To relay instructions, and pt. Stated she was actually taking vitamin D 5000 units, plus her calcium-carbonate-vit. D. Pt. Pleasantly agreed to stop taking both, and author reassured pt. that Dr. Charlett Blake and her team would follow up with her regarding next steps.

## 2018-01-12 NOTE — Assessment & Plan Note (Signed)
Staying active and managing with just one tylenol daily at bedtime.

## 2018-01-12 NOTE — Telephone Encounter (Signed)
Patient was given Prolia today 01/12/18. Patient scheduled for 6 months to see PCP would like Prolia at her next visit.  Please advise

## 2018-01-12 NOTE — Telephone Encounter (Signed)
Called about critical lab vitamin D 102.77   Please instruct pt to stop all vitamin D supplements OTC   TMS  CC PCP Dr. Charlett Blake

## 2018-01-12 NOTE — Assessment & Plan Note (Signed)
Prolia given today. 

## 2018-01-12 NOTE — Addendum Note (Signed)
Addended by: Magdalene Molly A on: 01/12/2018 03:15 PM   Modules accepted: Orders

## 2018-01-23 ENCOUNTER — Encounter: Payer: Self-pay | Admitting: Family Medicine

## 2018-02-19 ENCOUNTER — Telehealth: Payer: Self-pay | Admitting: Family Medicine

## 2018-02-19 NOTE — Telephone Encounter (Unsigned)
Copied from [place] [id number]. Topic: Quick Communication - Rx Refill/Question >> Feb 19, 2018  4:14 PM Carolyn Stare wrote: Medication  carvedilol (COREG) 12.5 MG tablet  Has the patient contacted their pharmacy yes   Preferred Pharmacy [facility]      Agent: Please be advised that RX refills may take up to 3 business days. We ask that you follow-up with your pharmacy.

## 2018-02-20 ENCOUNTER — Other Ambulatory Visit: Payer: Self-pay

## 2018-02-20 MED ORDER — CARVEDILOL 12.5 MG PO TABS: 12.5000 mg | ORAL_TABLET | Freq: Every day | ORAL | 0 refills | Status: DC

## 2018-04-24 DIAGNOSIS — Z23 Encounter for immunization: Secondary | ICD-10-CM | POA: Diagnosis not present

## 2018-05-19 ENCOUNTER — Other Ambulatory Visit: Payer: Self-pay | Admitting: Family Medicine

## 2018-05-19 MED ORDER — HYDROCHLOROTHIAZIDE 25 MG PO TABS: 25.0000 mg | ORAL_TABLET | Freq: Every day | ORAL | 0 refills | Status: DC

## 2018-05-19 MED ORDER — CARVEDILOL 12.5 MG PO TABS: 12.5000 mg | ORAL_TABLET | Freq: Every day | ORAL | 0 refills | Status: DC

## 2018-05-19 NOTE — Telephone Encounter (Signed)
Copied from [place] [id number]. Topic: Quick Communication - Rx Refill/Question >> May 19, 2018  9:04 AM Reyne Dumas L wrote: Medication:  carvedilol (COREG) 12.5 MG tablet hydrochlorothiazide (HYDRODIURIL) 25 MG tablet  Has the patient contacted their pharmacy? No. (Agent: If no, request that the patient contact the pharmacy for the refill.) (Agent: If yes, when and what did the pharmacy advise?)  Preferred Pharmacy (with phone number or street name): [place], [place] [phone] (Phone) [phone] (Fax)  Agent: Please be advised that RX refills may take up to 3 business days. We ask that you follow-up with your pharmacy.

## 2018-05-19 NOTE — Telephone Encounter (Signed)
Requested Prescriptions  Pending Prescriptions Disp Refills  . hydrochlorothiazide (HYDRODIURIL) 25 MG tablet 90 tablet 0    Sig: Take 1 tablet (25 mg total) by mouth daily.     Cardiovascular: Diuretics - Thiazide Passed - 05/19/2018  9:19 AM      Passed - Ca in normal range and within 360 days    Calcium  Date Value Ref Range Status  01/12/2018 9.8 8.4 - 10.5 mg/dL Final         Passed - Cr in normal range and within 360 days    Creat  Date Value Ref Range Status  10/14/2014 0.80 0.50 - 1.10 mg/dL Final   Creatinine, Ser  Date Value Ref Range Status  01/12/2018 0.82 0.40 - 1.20 mg/dL Final         Passed - K in normal range and within 360 days    Potassium  Date Value Ref Range Status  01/12/2018 4.3 3.5 - 5.1 mEq/L Final         Passed - Na in normal range and within 360 days    Sodium  Date Value Ref Range Status  01/12/2018 141 135 - 145 mEq/L Final         Passed - Last BP in normal range    BP Readings from Last 1 Encounters:  01/12/18 108/72         Passed - Valid encounter within last 6 months    Recent Outpatient Visits          4 months ago Essential hypertension   [facility] at [place], Bonnita Levan, MD   10 months ago Osteoporosis, unspecified osteoporosis type, unspecified pathological fracture presence   Archivist at [place], Bonnita Levan, MD   1 year ago Other fatigue   Archivist at [place], MD   1 year ago Benign hypertension   [facility]ivist at [place], Bonnita Levan, MD   1 year ago Essential hypertension   [facility]ivist at [facility] Mosie Lukes, MD      Future Appointments            In 1 month Mosie Lukes, MD [place] [facility] [place], [place]   In 7 months [place], [place], RN [place] [facility], [facility]          . carvedilol (COREG) 12.5 MG tablet 90 tablet 0    Sig: Take 1 tablet (12.5 mg total) by mouth daily.     Cardiovascular:  Beta Blockers Passed - 05/19/2018  9:19 AM      Passed - Last BP in normal range    BP Readings from Last 1 Encounters:  01/12/18 108/72         Passed - Last Heart Rate in normal range    Pulse Readings from Last 1 Encounters:  01/12/18 78         Passed - Valid encounter within last 6 months    Recent Outpatient Visits          4 months ago Essential hypertension   Archivist at [place], MD   10 months ago Osteoporosis, unspecified osteoporosis type, unspecified pathological fracture presence   [facility] at [place], Bonnita Levan, MD   1 year  ago Other fatigue   Archivist at [place], MD   1 year ago Benign hypertension   Archivist at [place], MD   1 year ago Essential hypertension   Archivist at [place], MD      Future Appointments            In 1 month Charlett Blake Bonnita Levan, MD [place] [facility] [place], [place]   In 7 months [place], Parthenia Ames, Research scientist (physical sciences) at [facility], [place]

## 2018-06-17 DIAGNOSIS — R42 Dizziness and giddiness: Secondary | ICD-10-CM | POA: Diagnosis not present

## 2018-07-14 ENCOUNTER — Ambulatory Visit (INDEPENDENT_AMBULATORY_CARE_PROVIDER_SITE_OTHER): Payer: Medicare Other | Admitting: Family Medicine

## 2018-07-14 ENCOUNTER — Telehealth (INDEPENDENT_AMBULATORY_CARE_PROVIDER_SITE_OTHER): Payer: Medicare Other

## 2018-07-14 ENCOUNTER — Encounter: Payer: Self-pay | Admitting: Family Medicine

## 2018-07-14 DIAGNOSIS — M79605 Pain in left leg: Secondary | ICD-10-CM

## 2018-07-14 DIAGNOSIS — I10 Essential (primary) hypertension: Secondary | ICD-10-CM | POA: Diagnosis not present

## 2018-07-14 DIAGNOSIS — M545 Low back pain: Secondary | ICD-10-CM

## 2018-07-14 DIAGNOSIS — R42 Dizziness and giddiness: Secondary | ICD-10-CM

## 2018-07-14 DIAGNOSIS — M810 Age-related osteoporosis without current pathological fracture: Secondary | ICD-10-CM

## 2018-07-14 DIAGNOSIS — E559 Vitamin D deficiency, unspecified: Secondary | ICD-10-CM | POA: Diagnosis not present

## 2018-07-14 DIAGNOSIS — R739 Hyperglycemia, unspecified: Secondary | ICD-10-CM | POA: Diagnosis not present

## 2018-07-14 DIAGNOSIS — M79604 Pain in right leg: Secondary | ICD-10-CM

## 2018-07-14 DIAGNOSIS — E782 Mixed hyperlipidemia: Secondary | ICD-10-CM

## 2018-07-14 HISTORY — DX: Dizziness and giddiness: R42

## 2018-07-14 LAB — TSH: TSH: 1.57 u[IU]/mL (ref 0.35–4.50)

## 2018-07-14 LAB — COMPREHENSIVE METABOLIC PANEL
ALT: 17 U/L (ref 0–35)
AST: 20 U/L (ref 0–37)
Albumin: 3.9 g/dL (ref 3.5–5.2)
Alkaline Phosphatase: 62 U/L (ref 39–117)
BILIRUBIN TOTAL: 0.7 mg/dL (ref 0.2–1.2)
BUN: 20 mg/dL (ref 6–23)
CO2: 27 mEq/L (ref 19–32)
CREATININE: 0.85 mg/dL (ref 0.40–1.20)
Calcium: 9.2 mg/dL (ref 8.4–10.5)
Chloride: 101 mEq/L (ref 96–112)
GFR: 67.57 mL/min (ref >60.00)
GLUCOSE: 92 mg/dL (ref 70–99)
Potassium: 3.9 mEq/L (ref 3.5–5.1)
Sodium: 139 mEq/L (ref 135–145)
TOTAL PROTEIN: 6.3 g/dL (ref 6.0–8.3)

## 2018-07-14 LAB — CBC
HCT: 40 % (ref 36.0–46.0)
HEMOGLOBIN: 13.3 g/dL (ref 12.0–15.0)
MCHC: 33.3 g/dL (ref 30.0–36.0)
MCV: 87.7 fl (ref 78.0–100.0)
Platelets: 184 10*3/uL (ref 150.0–400.0)
RBC: 4.56 Mil/uL (ref 3.87–5.11)
RDW: 14.4 % (ref 11.5–15.5)
WBC: 5.2 10*3/uL (ref 4.0–10.5)

## 2018-07-14 LAB — HEMOGLOBIN A1C: Hgb A1c MFr Bld: 5.9 % (ref 4.6–6.5)

## 2018-07-14 LAB — LIPID PANEL
Cholesterol: 170 mg/dL (ref 0–200)
HDL: 87.1 mg/dL (ref >39.00)
LDL Cholesterol: 54 mg/dL (ref 0–99)
NonHDL: 82.71
Total CHOL/HDL Ratio: 2
Triglycerides: 145 mg/dL (ref 0.0–149.0)
VLDL: 29 mg/dL (ref 0.0–40.0)

## 2018-07-14 LAB — VITAMIN D 25 HYDROXY (VIT D DEFICIENCY, FRACTURES): VITD: 83.66 ng/mL (ref 30.00–100.00)

## 2018-07-14 MED ORDER — MECLIZINE HCL 25 MG PO TABS: 12.5000 mg | ORAL_TABLET | Freq: Three times a day (TID) | ORAL | 1 refills | Status: DC | PRN

## 2018-07-14 MED ORDER — DENOSUMAB 60 MG/ML ~~LOC~~ SOSY
60.0000 mg | PREFILLED_SYRINGE | Freq: Once | SUBCUTANEOUS | Status: AC
  Administered 2018-07-14: 60 mg via SUBCUTANEOUS

## 2018-07-14 NOTE — Assessment & Plan Note (Signed)
hgba1c acceptable, minimize simple carbs. Increase exercise as tolerated.  

## 2018-07-14 NOTE — Patient Instructions (Signed)
Osteoporosis Osteoporosis is the thinning and loss of density in the bones. Osteoporosis makes the bones more brittle, fragile, and likely to break (fracture). Over time, osteoporosis can cause the bones to become so weak that they fracture after a simple fall. The bones most likely to fracture are the bones in the hip, wrist, and spine. What are the causes? The exact cause is not known. What increases the risk? Anyone can develop osteoporosis. You may be at greater risk if you have a family history of the condition or have poor nutrition. You may also have a higher risk if you are:  Female.  50 years old or older.  A smoker.  Not physically active.  White or Asian.  Slender.  What are the signs or symptoms? A fracture might be the first sign of the disease, especially if it results from a fall or injury that would not usually cause a bone to break. Other signs and symptoms include:  Low back and neck pain.  Stooped posture.  Height loss.  How is this diagnosed? To make a diagnosis, your health care provider may:  Take a medical history.  Perform a physical exam.  Order tests, such as: ? A bone mineral density test. ? A dual-energy X-ray absorptiometry test.  How is this treated? The goal of osteoporosis treatment is to strengthen your bones to reduce your risk of a fracture. Treatment may involve:  Making lifestyle changes, such as: ? Eating a diet rich in calcium. ? Doing weight-bearing and muscle-strengthening exercises. ? Stopping tobacco use. ? Limiting alcohol intake.  Taking medicine to slow the process of bone loss or to increase bone density.  Monitoring your levels of calcium and vitamin D.  Follow these instructions at home:  Include calcium and vitamin D in your diet. Calcium is important for bone health, and vitamin D helps the body absorb calcium.  Perform weight-bearing and muscle-strengthening exercises as directed by your health care  provider.  Do not use any tobacco products, including cigarettes, chewing tobacco, and electronic cigarettes. If you need help quitting, ask your health care provider.  Limit your alcohol intake.  Take medicines only as directed by your health care provider.  Keep all follow-up visits as directed by your health care provider. This is important.  Take precautions at home to lower your risk of falling, such as: ? Keeping rooms well lit and clutter free. ? Installing safety rails on stairs. ? Using rubber mats in the bathroom and other areas that are often wet or slippery. Get help right away if: You fall or injure yourself. This information is not intended to replace advice given to you by your health care provider. Make sure you discuss any questions you have with your health care provider. Document Released: 04/24/2005 Document Revised: 12/18/2015 Document Reviewed: 12/23/2013 Elsevier Interactive Patient Education  2018 Elsevier Inc.  

## 2018-07-14 NOTE — Telephone Encounter (Signed)
PA benefits run through [employer]. No PA required. Pt. May owe $0 OOP. Spare prolia in fridge available. Author administered into L arm SQ per Dr. Charlett Blake order; pt. tolerated well. Pt. instructed to make next appointment for on or after 01/15/2019, and pt. verbalized understanding.

## 2018-07-14 NOTE — Assessment & Plan Note (Signed)
Improving slowly but surely. Declines PT at this time she manages her ADLs well she will et [place] know if it worsens.

## 2018-07-14 NOTE — Progress Notes (Signed)
Subjective:    Patient ID: Jennifer Wilkins, female    DOB: Apr 05, 1932, 84 y.o.   MRN: [id number]  No chief complaint on file.   HPI Patient is in today for follow up and she feels well. No recent febrile illnesss or hospitalizations. No polyuria or polydipsia. Continues to struggle with back pain but manages to complete her activities of daily living most days. Denies CP/palp/SOB/HA/congestion/fevers/GI or GU c/o. Taking meds as prescribed  Past Medical History:  Diagnosis Date  . Anemia 09/30/2016  . Arthritis   . Elevated BP 04/21/2014  . HTN (hypertension)   . Hypercholesteremia   . Hypocalcemia 06/19/2013  . Low back pain radiating to both legs 08/27/2015  . Medicare annual wellness visit, subsequent 05/03/2015  . Preventative health care 06/02/2016  . Stress reaction 04/25/2015    Past Surgical History:  Procedure Laterality Date  . CHOLECYSTECTOMY    . GALLBLADDER SURGERY  1984   Gallstone removal  . LUMBAR LAMINECTOMY/DECOMPRESSION MICRODISCECTOMY N/A 09/11/2016   Procedure: Microlumbar Decompression L3-4 and L4-5, lateral mass fusion at L4-5 using autologous, autograft bone;  Surgeon: Susa Day, MD;  Location: [facility];  Service: Orthopedics;  Laterality: N/A;    Family History  Problem Relation Age of Onset  . Osteoporosis Mother   . Ulcers Father   . Kidney disease Paternal Grandfather     Social History   Socioeconomic History  . Marital status: Married    Spouse name: Not on file  . Number of children: Not on file  . Years of education: Not on file  . Highest education level: Not on file  Occupational History  . Not on file  Social Needs  . Financial resource strain: Not on file  . Food insecurity:    Worry: Not on file    Inability: Not on file  . Transportation needs:    Medical: Not on file    Non-medical: Not on file  Tobacco Use  . Smoking status: Never Smoker  . Smokeless tobacco: Never Used  Substance and Sexual Activity  . Alcohol use: No  .  Drug use: No  . Sexual activity: Not Currently  Lifestyle  . Physical activity:    Days per week: Not on file    Minutes per session: Not on file  . Stress: Not on file  Relationships  . Social connections:    Talks on phone: Not on file    Gets together: Not on file    Attends religious service: Not on file    Active member of club or organization: Not on file    Attends meetings of clubs or organizations: Not on file    Relationship status: Not on file  . Intimate partner violence:    Fear of current or ex partner: Not on file    Emotionally abused: Not on file    Physically abused: Not on file    Forced sexual activity: Not on file  Other Topics Concern  . Not on file  Social History Narrative   Widowed July 2017    Outpatient Medications Prior to Visit  Medication Sig Dispense Refill  . aspirin 81 MG tablet Take 1 tablet (81 mg total) by mouth daily. Resume 4 days post-op 30 tablet   . Calcium Carbonate-Vitamin D (CALCIUM 600/VITAMIN D) 600-400 MG-UNIT per tablet Take 1 tablet by mouth 2 (two) times daily.    . carvedilol (COREG) 12.5 MG tablet Take 1 tablet (12.5 mg total) by mouth daily. 90 tablet  0  . cholecalciferol (VITAMIN D) 1000 units tablet Take 1,000 Units by mouth daily.    . Cyanocobalamin (VITAMIN B-12 CR) 1500 MCG TBCR Take by mouth daily.    . Diclofenac Sodium (PENNSAID) 2 % SOLN Place onto the skin.    Marland Kitchen FIBER ADULT GUMMIES PO Take 2 each by mouth.    . hydrochlorothiazide (HYDRODIURIL) 25 MG tablet Take 1 tablet (25 mg total) by mouth daily. 90 tablet 0  . Methyl Salicylate-Lido-Menthol (LIDOCAINE OINT & MEN-METH SAL EX) Apply topically.    . Misc Natural Products (OSTEO BI-FLEX ADV JOINT SHIELD) TABS Take 1 tablet by mouth daily. Resume 5 days post-op    . Multiple Vitamins-Minerals (HAIR/SKIN/NAILS/BIOTIN) TABS Take 1 tablet by mouth daily. Resume 5 days post-op    . Multiple Vitamins-Minerals (MULTIVITAMIN WITH MINERALS) tablet Take 1 tablet by mouth  daily. Resume 5 days post-op    . Omega-3 Fatty Acids (FISH OIL) 1000 MG CAPS Take by mouth.    . polyethylene glycol (MIRALAX / GLYCOLAX) packet Take 17 g by mouth daily. 14 each 0  . Probiotic Product (PROBIOTIC DAILY PO) Take by mouth.    . Turmeric 500 MG CAPS Take 1 capsule by mouth daily. Resume 5 days post-op as needed    . vitamin E 400 UNIT capsule Take 1 capsule (400 Units total) by mouth daily. Resume 5 days post-op     No facility-administered medications prior to visit.     No Known Allergies  Review of Systems  Constitutional: Negative for fever and malaise/fatigue.  HENT: Negative for congestion.   Eyes: Negative for blurred vision.  Respiratory: Negative for shortness of breath.   Cardiovascular: Negative for chest pain, palpitations and leg swelling.  Gastrointestinal: Negative for abdominal pain, blood in stool and nausea.  Genitourinary: Negative for dysuria and frequency.  Musculoskeletal: Positive for back pain. Negative for falls.  Skin: Negative for rash.  Neurological: Positive for dizziness. Negative for loss of consciousness and headaches.  Endo/Heme/Allergies: Negative for environmental allergies.  Psychiatric/Behavioral: Negative for depression. The patient is not nervous/anxious.        Objective:    Physical Exam Vitals signs and nursing note reviewed.  Constitutional:      General: She is not in acute distress.    Appearance: She is well-developed.  HENT:     Head: Normocephalic and atraumatic.     Nose: Nose normal.  Eyes:     General:        Right eye: No discharge.        Left eye: No discharge.  Neck:     Musculoskeletal: Normal range of motion and neck supple.  Cardiovascular:     Rate and Rhythm: Normal rate and regular rhythm.     Heart sounds: Murmur present.  Pulmonary:     Effort: Pulmonary effort is normal.     Breath sounds: Normal breath sounds.  Abdominal:     General: Bowel sounds are normal.     Palpations: Abdomen is  soft.     Tenderness: There is no abdominal tenderness.  Skin:    General: Skin is warm and dry.  Neurological:     Mental Status: She is alert and oriented to person, place, and time.     BP 102/72 (BP Location: Left Arm, Patient Position: Sitting, Cuff Size: Normal)   Pulse 95   Temp 97.7 F (36.5 C) (Oral)   Resp 18   Ht 4\' 8"  (1.422 m)   Wt 115  lb 9.6 oz (52.4 kg)   SpO2 98%   BMI 25.92 kg/m  Wt Readings from Last 3 Encounters:  07/14/18 115 lb 9.6 oz (52.4 kg)  01/12/18 114 lb (51.7 kg)  01/02/18 115 lb 9.6 oz (52.4 kg)     Lab Results  Component Value Date   WBC 5.2 07/14/2018   HGB 13.3 07/14/2018   HCT 40.0 07/14/2018   PLT 184.0 07/14/2018   GLUCOSE 92 07/14/2018   CHOL 170 07/14/2018   TRIG 145.0 07/14/2018   HDL 87.10 07/14/2018   LDLCALC 54 07/14/2018   ALT 17 07/14/2018   AST 20 07/14/2018   NA 139 07/14/2018   K 3.9 07/14/2018   CL 101 07/14/2018   CREATININE 0.85 07/14/2018   BUN 20 07/14/2018   CO2 27 07/14/2018   TSH 1.57 07/14/2018   HGBA1C 5.9 07/14/2018    Lab Results  Component Value Date   TSH 1.57 07/14/2018   Lab Results  Component Value Date   WBC 5.2 07/14/2018   HGB 13.3 07/14/2018   HCT 40.0 07/14/2018   MCV 87.7 07/14/2018   PLT 184.0 07/14/2018   Lab Results  Component Value Date   NA 139 07/14/2018   K 3.9 07/14/2018   CO2 27 07/14/2018   GLUCOSE 92 07/14/2018   BUN 20 07/14/2018   CREATININE 0.85 07/14/2018   BILITOT 0.7 07/14/2018   ALKPHOS 62 07/14/2018   AST 20 07/14/2018   ALT 17 07/14/2018   PROT 6.3 07/14/2018   ALBUMIN 3.9 07/14/2018   CALCIUM 9.2 07/14/2018   ANIONGAP 7 09/12/2016   GFR 67.57 07/14/2018   Lab Results  Component Value Date   CHOL 170 07/14/2018   Lab Results  Component Value Date   HDL 87.10 07/14/2018   Lab Results  Component Value Date   LDLCALC 54 07/14/2018   Lab Results  Component Value Date   TRIG 145.0 07/14/2018   Lab Results  Component Value Date   CHOLHDL  2 07/14/2018   Lab Results  Component Value Date   HGBA1C 5.9 07/14/2018       Assessment & Plan:   Problem List Items Addressed This Visit    Osteoporosis    Encouraged to get adequate exercise, calcium and vitamin d intake. Tolerating Prolia. Due for next shot, will give today      Hyperlipidemia, mixed    Encouraged heart healthy diet, increase exercise, avoid trans fats, consider a krill oil cap daily      Relevant Orders   Lipid panel (Completed)   Vitamin D deficiency    Supplement and monitor      Relevant Orders   Vitamin D (25 hydroxy) (Completed)   Essential hypertension    Well controlled, no changes to meds. Encouraged heart healthy diet such as the DASH diet and exercise as tolerated.       Relevant Orders   CBC (Completed)   Comprehensive metabolic panel (Completed)   TSH (Completed)   RESOLVED: Hypocalcemia    Check cmp      Low back pain radiating to both legs    Improving slowly but surely. Declines PT at this time she manages her ADLs well she will et [place] know if it worsens.       Hyperglycemia    hgba1c acceptable, minimize simple carbs. Increase exercise as tolerated.       Relevant Orders   Hemoglobin A1c (Completed)   Vertigo    Hydrate better. Meclizine 12.5 prn. Report  worsening symptoms         I am having Jennifer Wilkins start on meclizine. I am also having her maintain her Vitamin B-12 CR, Calcium Carbonate-Vitamin D, Probiotic Product (PROBIOTIC DAILY PO), FIBER ADULT GUMMIES PO, cholecalciferol, polyethylene glycol, aspirin, multivitamin with minerals, HAIR/SKIN/NAILS/BIOTIN, OSTEO BI-FLEX ADV JOINT SHIELD, Turmeric, vitamin E, Methyl Salicylate-Lido-Menthol (LIDOCAINE OINT & MEN-METH SAL EX), Diclofenac Sodium, hydrochlorothiazide, carvedilol, and Fish Oil.  Meds ordered this encounter  Medications  . meclizine (ANTIVERT) 25 MG tablet    Sig: Take 0.5-1 tablets (12.5-25 mg total) by mouth 3 (three) times daily as needed for  dizziness.    Dispense:  30 tablet    Refill:  1     Penni Homans, MD

## 2018-07-14 NOTE — Assessment & Plan Note (Signed)
Check cmp 

## 2018-07-14 NOTE — Assessment & Plan Note (Addendum)
Encouraged to get adequate exercise, calcium and vitamin d intake. Tolerating Prolia. Due for next shot, will give today

## 2018-07-14 NOTE — Assessment & Plan Note (Signed)
Encouraged heart healthy diet, increase exercise, avoid trans fats, consider a krill oil cap daily 

## 2018-07-14 NOTE — Assessment & Plan Note (Signed)
Supplement and monitor 

## 2018-07-14 NOTE — Assessment & Plan Note (Signed)
Hydrate better. Meclizine 12.5 prn. Report worsening symptoms

## 2018-07-14 NOTE — Assessment & Plan Note (Signed)
Well controlled, no changes to meds. Encouraged heart healthy diet such as the DASH diet and exercise as tolerated.  °

## 2018-08-20 ENCOUNTER — Other Ambulatory Visit: Payer: Self-pay | Admitting: Family Medicine

## 2018-08-20 MED ORDER — CARVEDILOL 12.5 MG PO TABS: 12.5000 mg | ORAL_TABLET | Freq: Every day | ORAL | 0 refills | Status: DC

## 2018-08-20 MED ORDER — HYDROCHLOROTHIAZIDE 25 MG PO TABS: 25.0000 mg | ORAL_TABLET | Freq: Every day | ORAL | 0 refills | Status: DC

## 2018-08-20 NOTE — Telephone Encounter (Signed)
Copied from [place] [id number]. Topic: Quick Communication - Rx Refill/Question >> Aug 20, 2018  8:20 AM Judyann Munson wrote: Medication: carvedilol (COREG) 12.5 MG tablet,hydrochlorothiazide (HYDRODIURIL) 25 MG tablet  Has the patient contacted their pharmacy? YES  Preferred Pharmacy (with phone number or street name): [facility], [place] [phone] (Phone) [phone] (Fax)    Agent: Please be advised that RX refills may take up to 3 business days. We ask that you follow-up with your pharmacy.

## 2018-10-06 DIAGNOSIS — J40 Bronchitis, not specified as acute or chronic: Secondary | ICD-10-CM | POA: Diagnosis not present

## 2018-10-06 DIAGNOSIS — R05 Cough: Secondary | ICD-10-CM | POA: Diagnosis not present

## 2018-10-06 DIAGNOSIS — R509 Fever, unspecified: Secondary | ICD-10-CM | POA: Diagnosis not present

## 2018-11-27 ENCOUNTER — Telehealth: Payer: Self-pay | Admitting: Family Medicine

## 2018-11-27 NOTE — Telephone Encounter (Signed)
Copied from [place] [phone]. Topic: Quick Communication - Rx Refill/Question >> Nov 27, 2018  1:48 PM Robina Ade, Helene Kelp D wrote: Medication:carvedilol (COREG) 12.5 MG tablet, hydrochlorothiazide (HYDRODIURIL) 25 MG tablet  Has the patient contacted their pharmacy? Yes (Agent: If no, request that the patient contact the pharmacy for the refill.) (Agent: If yes, when and what did the pharmacy advise?)  Preferred Pharmacy (with phone number or street name): send to [phone]  Agent: Please be advised that RX refills may take up to 3 business days. We ask that you follow-up with your pharmacy.

## 2018-12-02 ENCOUNTER — Other Ambulatory Visit: Payer: Self-pay | Admitting: Family Medicine

## 2018-12-03 NOTE — Telephone Encounter (Signed)
Pt following up on this med request and wants sent to   [place], [place] [phone] (Phone) [phone] (Fax)   Pt states now she is completely out of her medications, and she called a week ago.  carvedilol (COREG) 12.5 MG tablet hydrochlorothiazide (HYDRODIURIL) 25 MG tablet

## 2018-12-03 NOTE — Telephone Encounter (Signed)
Sent medication refills

## 2018-12-07 ENCOUNTER — Telehealth: Payer: Self-pay

## 2018-12-07 MED ORDER — HYDROCHLOROTHIAZIDE 25 MG PO TABS: 25.0000 mg | ORAL_TABLET | Freq: Every day | ORAL | 3 refills | Status: DC

## 2018-12-07 MED ORDER — CARVEDILOL 12.5 MG PO TABS: 12.5000 mg | ORAL_TABLET | Freq: Every day | ORAL | 3 refills | Status: DC

## 2018-12-07 MED FILL — HYDROCHLOROTHIAZIDE 25 MG T: 25 | 90 days supply | Qty: 90 | Fill #0

## 2018-12-07 MED FILL — CARVEDILOL 12.5 MG TABLET: 12.5 | 90 days supply | Qty: 90 | Fill #0

## 2018-12-07 NOTE — Telephone Encounter (Signed)
Copied from [place] [phone]. Topic: General - Other >> Dec 07, 2018 10:14 AM Yvette Rack wrote: Reason for CRM: Pt stated she is completely out of her medications and she is not getting an answer when she calls [facility] so she would like the Rx for carvedilol (COREG) 12.5 MG tablet and hydrochlorothiazide (HYDRODIURIL) 25 MG tablet to be sent to [place], [place]

## 2018-12-07 NOTE — Telephone Encounter (Signed)
Medications sent in to pharmacy requested

## 2018-12-28 ENCOUNTER — Telehealth: Payer: Self-pay | Admitting: Family Medicine

## 2018-12-28 ENCOUNTER — Telehealth: Payer: Self-pay | Admitting: *Deleted

## 2018-12-28 NOTE — Telephone Encounter (Signed)
Mel Almond -- can you assist with this?  Copied from [place] #[id number]. Topic: General - Inquiry >> Dec 28, 2018 10:56 AM Mathis Bud wrote: Reason for CRM: Patient has an appt with PCP 6/23.  Patient would like to know if she can get her prolia injection on the same day. She has limited transportation. Call back #  [phone]

## 2018-12-28 NOTE — Telephone Encounter (Signed)
Copied from [place] [phone]. Topic: General - Inquiry >> Dec 28, 2018 10:52 AM Mathis Bud wrote: Reason for CRM: Patient would like to cancel AWV. Patient will call back to reschedule.  She is living in a nursing home and has no transportation as of right now due to [place].

## 2018-12-28 NOTE — Telephone Encounter (Signed)
Done

## 2018-12-30 NOTE — Telephone Encounter (Signed)
Verified patient in prolia portal. Received summary of benefits. Will meet with margaret and jeff to discuss patients cost.

## 2019-01-05 ENCOUNTER — Ambulatory Visit: Payer: Medicare Other | Admitting: *Deleted

## 2019-01-19 ENCOUNTER — Ambulatory Visit: Payer: Medicare Other | Admitting: Family Medicine

## 2019-01-19 ENCOUNTER — Other Ambulatory Visit: Payer: Self-pay

## 2019-01-19 ENCOUNTER — Ambulatory Visit (INDEPENDENT_AMBULATORY_CARE_PROVIDER_SITE_OTHER): Payer: Medicare Other

## 2019-01-19 DIAGNOSIS — M810 Age-related osteoporosis without current pathological fracture: Secondary | ICD-10-CM | POA: Diagnosis not present

## 2019-01-19 MED ORDER — DENOSUMAB 60 MG/ML ~~LOC~~ SOSY
60.0000 mg | PREFILLED_SYRINGE | Freq: Once | SUBCUTANEOUS | Status: AC
  Administered 2019-01-19: 60 mg via SUBCUTANEOUS

## 2019-01-19 NOTE — Progress Notes (Addendum)
Patient came in today to have her Prolia Injection done, ordered per Dr. Charlett Blake. Patient tolerated 60mg /mL in her right arm Subq.  She had no complications   Nursing note reviewed. Agree with documention and plan.

## 2019-02-12 DIAGNOSIS — Z5189 Encounter for other specified aftercare: Secondary | ICD-10-CM | POA: Diagnosis not present

## 2019-03-31 NOTE — Progress Notes (Addendum)
Virtual Visit via Video Note  I connected with patient on 04/01/19 at  2:30 PM EDT by audio enabled telemedicine application and verified that I am speaking with the correct person using two identifiers.   THIS ENCOUNTER IS A VIRTUAL VISIT DUE TO COVID-19 - PATIENT WAS NOT SEEN IN THE OFFICE. PATIENT HAS CONSENTED TO VIRTUAL VISIT / TELEMEDICINE VISIT   Location of patient: home  Location of provider: office  I discussed the limitations of evaluation and management by telemedicine and the availability of in person appointments. The patient expressed understanding and agreed to proceed.   Subjective:   Jennifer Wilkins is a 85 y.o. female who presents for Medicare Annual (Subsequent) preventive examination.  Review of Systems:  Home Safety/Smoke Alarms: Feels safe in home. Smoke alarms in place.  Lives at [facility] in independent living section. Walk in shower w/ bench. Emergency pull cords in apt. Uses walker when she goes out.  Female:   Mammo- declines      Dexa scan- 07/11/17        Objective:     Vitals: Unable to assess. This visit is enabled though telemedicine due to Covid 19.   Advanced Directives 04/01/2019 01/02/2018 12/30/2016 09/11/2016 09/03/2016 04/25/2015  Does Patient Have a Medical Advance Directive? Yes Yes Yes Yes Yes Yes  Type of Advance Directive Out of facility DNR (pink MOST or yellow form) [place];Living will [place];Living will [place];Living will [place];Living will [place];Living will  Does patient want to make changes to medical advance directive? - - - No - Patient declined - Yes - information given  Copy of [place] in Chart? - Yes No - copy requested No - copy requested No - copy requested No - copy requested    Tobacco Social History   Tobacco Use  Smoking Status Never Smoker  Smokeless Tobacco Never Used     Counseling given:  Not Answered   Clinical Intake: Pain : No/denies pain   Past Medical History:  Diagnosis Date  . Anemia 09/30/2016  . Arthritis   . Elevated BP 04/21/2014  . HTN (hypertension)   . Hypercholesteremia   . Hypocalcemia 06/19/2013  . Low back pain radiating to both legs 08/27/2015  . Medicare annual wellness visit, subsequent 05/03/2015  . Preventative health care 06/02/2016  . Stress reaction 04/25/2015   Past Surgical History:  Procedure Laterality Date  . CHOLECYSTECTOMY    . GALLBLADDER SURGERY  1984   Gallstone removal  . LUMBAR LAMINECTOMY/DECOMPRESSION MICRODISCECTOMY N/A 09/11/2016   Procedure: Microlumbar Decompression L3-4 and L4-5, lateral mass fusion at L4-5 using autologous, autograft bone;  Surgeon: Susa Day, MD;  Location: [facility];  Service: Orthopedics;  Laterality: N/A;   Family History  Problem Relation Age of Onset  . Osteoporosis Mother   . Ulcers Father   . Kidney disease Paternal Grandfather    Social History   Socioeconomic History  . Marital status: Married    Spouse name: Not on file  . Number of children: Not on file  . Years of education: Not on file  . Highest education level: Not on file  Occupational History  . Not on file  Social Needs  . Financial resource strain: Not on file  . Food insecurity    Worry: Not on file    Inability: Not on file  . Transportation needs    Medical: Not on file  Non-medical: Not on file  Tobacco Use  . Smoking status: Never Smoker  . Smokeless tobacco: Never Used  Substance and Sexual Activity  . Alcohol use: No  . Drug use: No  . Sexual activity: Not Currently  Lifestyle  . Physical activity    Days per week: Not on file    Minutes per session: Not on file  . Stress: Not on file  Relationships  . Social Herbalist on phone: Not on file    Gets together: Not on file    Attends religious service: Not on file    Active member of club or organization: Not on file    Attends meetings of  clubs or organizations: Not on file    Relationship status: Not on file  Other Topics Concern  . Not on file  Social History Narrative   Widowed July 2017    Outpatient Encounter Medications as of 04/01/2019  Medication Sig  . aspirin 81 MG tablet Take 1 tablet (81 mg total) by mouth daily. Resume 4 days post-op  . Calcium Carbonate-Vitamin D (CALCIUM 600/VITAMIN D) 600-400 MG-UNIT per tablet Take 1 tablet by mouth 2 (two) times daily.  . carvedilol (COREG) 12.5 MG tablet Take 1 tablet (12.5 mg total) by mouth daily.  . cholecalciferol (VITAMIN D) 1000 units tablet Take 1,000 Units by mouth daily.  . Cyanocobalamin (VITAMIN B-12 CR) 1500 MCG TBCR Take by mouth daily.  . Diclofenac Sodium (PENNSAID) 2 % SOLN Place onto the skin.  Marland Kitchen FIBER ADULT GUMMIES PO Take 2 each by mouth.  . hydrochlorothiazide (HYDRODIURIL) 25 MG tablet Take 1 tablet (25 mg total) by mouth daily.  . meclizine (ANTIVERT) 25 MG tablet Take 0.5-1 tablets (12.5-25 mg total) by mouth 3 (three) times daily as needed for dizziness.  . Methyl Salicylate-Lido-Menthol (LIDOCAINE OINT & MEN-METH SAL EX) Apply topically.  . Misc Natural Products (OSTEO BI-FLEX ADV JOINT SHIELD) TABS Take 1 tablet by mouth daily. Resume 5 days post-op  . Multiple Vitamins-Minerals (HAIR/SKIN/NAILS/BIOTIN) TABS Take 1 tablet by mouth daily. Resume 5 days post-op  . Multiple Vitamins-Minerals (MULTIVITAMIN WITH MINERALS) tablet Take 1 tablet by mouth daily. Resume 5 days post-op  . Omega-3 Fatty Acids (FISH OIL) 1000 MG CAPS Take by mouth.  . polyethylene glycol (MIRALAX / GLYCOLAX) packet Take 17 g by mouth daily.  . Probiotic Product (PROBIOTIC DAILY PO) Take by mouth.  . Turmeric 500 MG CAPS Take 1 capsule by mouth daily. Resume 5 days post-op as needed  . vitamin E 400 UNIT capsule Take 1 capsule (400 Units total) by mouth daily. Resume 5 days post-op   No facility-administered encounter medications on file as of 04/01/2019.     Activities of  Daily Living In your present state of health, do you have any difficulty performing the following activities: 04/01/2019  Hearing? Y  Comment has hearing aids.  Vision? N  Difficulty concentrating or making decisions? N  Walking or climbing stairs? N  Dressing or bathing? N  Doing errands, shopping? Y  Comment uses transportaion. does not drive  Preparing Food and eating ? N  Using the Toilet? N  In the past six months, have you accidently leaked urine? N  Do you have problems with loss of bowel control? N  Managing your Medications? N  Managing your Finances? N  Housekeeping or managing your Housekeeping? N  Some recent data might be hidden    Patient Care Team: Mosie Lukes, MD as PCP -  General (Family Medicine) Marica Otter, OD (Optometry)    Assessment:   This is a routine wellness examination for Jennifer Wilkins. Physical assessment deferred to PCP.  Exercise Activities and Dietary recommendations Current Exercise Habits: Home exercise routine, Type of exercise: walking, Time (Minutes): 45, Frequency (Times/Week): 7, Weekly Exercise (Minutes/Week): 315, Intensity: Mild, Exercise limited by: None identified   Diet (meal preparation, eat out, water intake, caffeinated beverages, dairy products, fruits and vegetables): well balanced, on average, 3 meals per day    Goals    . DIET - INCREASE WATER INTAKE    . Maintain current lifestyle.       Fall Risk Fall Risk  04/01/2019 01/02/2018 12/30/2016 05/28/2016 04/25/2015  Falls in the past year? 0 No No No No    Depression Screen PHQ 2/9 Scores 04/01/2019 01/02/2018 12/30/2016 05/28/2016  PHQ - 2 Score 0 0 0 4  PHQ- 9 Score - - - 4     Cognitive Function   MMSE - Mini Mental State Exam 01/02/2018 12/30/2016  Orientation to time 5 5  Orientation to Place 5 5  Registration 3 3  Attention/ Calculation 5 5  Recall 3 3  Language- name 2 objects 2 2  Language- repeat 1 1  Language- follow 3 step command 3 3  Language- read & follow  direction 1 1  Write a sentence 1 1  Copy design 1 1  Total score 30 30     6CIT Screen 04/01/2019  What time? 0 points  Count back from 20 0 points  Months in reverse 0 points  Repeat phrase 0 points    Immunization History  Administered Date(s) Administered  . Influenza Split 04/24/2012  . Influenza, High Dose Seasonal PF 04/25/2015, 05/02/2017  . Influenza,inj,Quad PF,6+ Mos 04/21/2014, 05/02/2017  . Influenza,inj,quad, With Preservative 03/29/2017  . Influenza-Unspecified 04/24/2012, 04/28/2013  . Pneumococcal Conjugate-13 06/14/2013, 10/14/2014  . Pneumococcal Polysaccharide-23 05/30/2016  . Tdap 06/14/2013  . Zoster 07/29/2014  . Zoster Recombinat (Shingrix) 11/12/2017    Screening Tests Health Maintenance  Topic Date Due  . INFLUENZA VACCINE  02/27/2019  . TETANUS/TDAP  06/15/2023  . DEXA SCAN  Completed  . PNA vac Low Risk Adult  Completed       Plan:   See you next year!  Continue to eat heart healthy diet (full of fruits, vegetables, whole grains, lean protein, water--limit salt, fat, and sugar intake) and increase physical activity as tolerated.  Continue doing brain stimulating activities (puzzles, reading, adult coloring books, staying active) to keep memory sharp.     I have personally reviewed and noted the following in the patient's chart:   . Medical and social history . Use of alcohol, tobacco or illicit drugs  . Current medications and supplements . Functional ability and status . Nutritional status . Physical activity . Advanced directives . List of other physicians . Hospitalizations, surgeries, and ER visits in previous 12 months . Vitals . Screenings to include cognitive, depression, and falls . Referrals and appointments  In addition, I have reviewed and discussed with patient certain preventive protocols, quality metrics, and best practice recommendations. A written personalized care plan for preventive services as well as general  preventive health recommendations were provided to patient.     Shela Nevin, [place]  04/01/2019  Medical screening examination/treatment was performed by qualified clinical staff member and as supervising physician I was immediately available for consultation/collaboration. I have reviewed documentation and agree with assessment and plan.  Penni Homans, MD

## 2019-04-01 ENCOUNTER — Ambulatory Visit (INDEPENDENT_AMBULATORY_CARE_PROVIDER_SITE_OTHER): Payer: Medicare Other | Admitting: *Deleted

## 2019-04-01 ENCOUNTER — Other Ambulatory Visit: Payer: Self-pay

## 2019-04-01 ENCOUNTER — Encounter: Payer: Self-pay | Admitting: *Deleted

## 2019-04-01 DIAGNOSIS — Z0000 Encounter for general adult medical examination without abnormal findings: Secondary | ICD-10-CM | POA: Diagnosis not present

## 2019-04-01 NOTE — Patient Instructions (Signed)
See you next year!  Continue to eat heart healthy diet (full of fruits, vegetables, whole grains, lean protein, water--limit salt, fat, and sugar intake) and increase physical activity as tolerated.  Continue doing brain stimulating activities (puzzles, reading, adult coloring books, staying active) to keep memory sharp.    Jennifer Wilkins , Thank you for taking time to come for your Medicare Wellness Visit. I appreciate your ongoing commitment to your health goals. Please review the following plan we discussed and let me know if I can assist you in the future.   These are the goals we discussed: Goals    . DIET - INCREASE WATER INTAKE    . Maintain current lifestyle.       This is a list of the screening recommended for you and due dates:  Health Maintenance  Topic Date Due  . Flu Shot  02/27/2019  . Tetanus Vaccine  06/15/2023  . DEXA scan (bone density measurement)  Completed  . Pneumonia vaccines  Completed    Health Maintenance After Age 38 After age 30, you are at a higher risk for certain long-term diseases and infections as well as injuries from falls. Falls are a major cause of broken bones and head injuries in people who are older than age 17. Getting regular preventive care can help to keep you healthy and well. Preventive care includes getting regular testing and making lifestyle changes as recommended by your health care provider. Talk with your health care provider about:  Which screenings and tests you should have. A screening is a test that checks for a disease when you have no symptoms.  A diet and exercise plan that is right for you. What should I know about screenings and tests to prevent falls? Screening and testing are the best ways to find a health problem early. Early diagnosis and treatment give you the best chance of managing medical conditions that are common after age 42. Certain conditions and lifestyle choices may make you more likely to have a fall. Your health  care provider may recommend:  Regular vision checks. Poor vision and conditions such as cataracts can make you more likely to have a fall. If you wear glasses, make sure to get your prescription updated if your vision changes.  Medicine review. Work with your health care provider to regularly review all of the medicines you are taking, including over-the-counter medicines. Ask your health care provider about any side effects that may make you more likely to have a fall. Tell your health care provider if any medicines that you take make you feel dizzy or sleepy.  Osteoporosis screening. Osteoporosis is a condition that causes the bones to get weaker. This can make the bones weak and cause them to break more easily.  Blood pressure screening. Blood pressure changes and medicines to control blood pressure can make you feel dizzy.  Strength and balance checks. Your health care provider may recommend certain tests to check your strength and balance while standing, walking, or changing positions.  Foot health exam. Foot pain and numbness, as well as not wearing proper footwear, can make you more likely to have a fall.  Depression screening. You may be more likely to have a fall if you have a fear of falling, feel emotionally low, or feel unable to do activities that you used to do.  Alcohol use screening. Using too much alcohol can affect your balance and may make you more likely to have a fall. What actions can I  take to lower my risk of falls? General instructions  Talk with your health care provider about your risks for falling. Tell your health care provider if: ? You fall. Be sure to tell your health care provider about all falls, even ones that seem minor. ? You feel dizzy, sleepy, or off-balance.  Take over-the-counter and prescription medicines only as told by your health care provider. These include any supplements.  Eat a healthy diet and maintain a healthy weight. A healthy diet  includes low-fat dairy products, low-fat (lean) meats, and fiber from whole grains, beans, and lots of fruits and vegetables. Home safety  Remove any tripping hazards, such as rugs, cords, and clutter.  Install safety equipment such as grab bars in bathrooms and safety rails on stairs.  Keep rooms and walkways well-lit. Activity   Follow a regular exercise program to stay fit. This will help you maintain your balance. Ask your health care provider what types of exercise are appropriate for you.  If you need a cane or walker, use it as recommended by your health care provider.  Wear supportive shoes that have nonskid soles. Lifestyle  Do not drink alcohol if your health care provider tells you not to drink.  If you drink alcohol, limit how much you have: ? 0-1 drink a day for women. ? 0-2 drinks a day for men.  Be aware of how much alcohol is in your drink. In the [place] one drink equals one typical bottle of beer (12 oz), one-half glass of wine (5 oz), or one shot of hard liquor (1 oz).  Do not use any products that contain nicotine or tobacco, such as cigarettes and e-cigarettes. If you need help quitting, ask your health care provider. Summary  Having a healthy lifestyle and getting preventive care can help to protect your health and wellness after age 48.  Screening and testing are the best way to find a health problem early and help you avoid having a fall. Early diagnosis and treatment give you the best chance for managing medical conditions that are more common for people who are older than age 37.  Falls are a major cause of broken bones and head injuries in people who are older than age 47. Take precautions to prevent a fall at home.  Work with your health care provider to learn what changes you can make to improve your health and wellness and to prevent falls. This information is not intended to replace advice given to you by your health care provider. Make sure you  discuss any questions you have with your health care provider. Document Released: 05/28/2017 Document Revised: 11/05/2018 Document Reviewed: 05/28/2017 Elsevier Patient Education  2020 [employer].

## 2019-05-11 ENCOUNTER — Other Ambulatory Visit: Payer: Self-pay

## 2019-05-11 ENCOUNTER — Ambulatory Visit (INDEPENDENT_AMBULATORY_CARE_PROVIDER_SITE_OTHER): Payer: Medicare Other

## 2019-05-11 DIAGNOSIS — Z23 Encounter for immunization: Secondary | ICD-10-CM | POA: Diagnosis not present

## 2019-06-16 DIAGNOSIS — M79662 Pain in left lower leg: Secondary | ICD-10-CM | POA: Diagnosis not present

## 2019-06-16 DIAGNOSIS — M5417 Radiculopathy, lumbosacral region: Secondary | ICD-10-CM | POA: Diagnosis not present

## 2019-06-16 MED FILL — predniSONE 5 MG TABS: 5 | 6 days supply | Qty: 21 | Fill #0

## 2019-07-22 ENCOUNTER — Other Ambulatory Visit: Payer: Self-pay

## 2019-07-22 ENCOUNTER — Ambulatory Visit (INDEPENDENT_AMBULATORY_CARE_PROVIDER_SITE_OTHER): Payer: Medicare Other | Admitting: *Deleted

## 2019-07-22 DIAGNOSIS — M810 Age-related osteoporosis without current pathological fracture: Secondary | ICD-10-CM | POA: Diagnosis not present

## 2019-07-22 MED ORDER — DENOSUMAB 60 MG/ML ~~LOC~~ SOSY
60.0000 mg | PREFILLED_SYRINGE | Freq: Once | SUBCUTANEOUS | Status: AC
  Administered 2019-07-22: 60 mg via SUBCUTANEOUS

## 2019-07-22 NOTE — Progress Notes (Signed)
Patient here for prolia injection per Dr. Charlett Blake.  Injection given in left arm and patient tolerated well.

## 2019-08-10 DIAGNOSIS — Z23 Encounter for immunization: Secondary | ICD-10-CM | POA: Diagnosis not present

## 2019-09-01 ENCOUNTER — Telehealth: Payer: Self-pay

## 2019-09-01 DIAGNOSIS — M810 Age-related osteoporosis without current pathological fracture: Secondary | ICD-10-CM

## 2019-09-01 NOTE — Telephone Encounter (Signed)
Patient's daughter Hilda Blades called in needing to speak with Dr. Charlett Blake or the nurse about the patient's current condition. Please give the patients daughter Hilda Blades a call back at [phone]   Thanks

## 2019-09-03 NOTE — Telephone Encounter (Signed)
Called left message for patients daughter to call the office back

## 2019-09-03 NOTE — Telephone Encounter (Signed)
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 They want to know if she can take another medication or d/c the Polia and also when can she do another bone density scan scan  Please advise

## 2019-09-05 NOTE — Telephone Encounter (Signed)
It was 2 years in December since her last one so she can have one anytime. If she agrees please order her one and then we can discuss options. Not a lot of options left but some. She can stop Prolia while we check everything out.

## 2019-09-06 DIAGNOSIS — R509 Fever, unspecified: Secondary | ICD-10-CM | POA: Diagnosis not present

## 2019-09-06 DIAGNOSIS — R519 Headache, unspecified: Secondary | ICD-10-CM | POA: Diagnosis not present

## 2019-09-06 DIAGNOSIS — R040 Epistaxis: Secondary | ICD-10-CM | POA: Diagnosis not present

## 2019-09-07 ENCOUNTER — Telehealth: Payer: Self-pay | Admitting: Family Medicine

## 2019-09-07 DIAGNOSIS — Z23 Encounter for immunization: Secondary | ICD-10-CM | POA: Diagnosis not present

## 2019-09-07 NOTE — Telephone Encounter (Signed)
See other phone note regarding this message.

## 2019-09-07 NOTE — Telephone Encounter (Signed)
Spoke to the daughter and she agreed to have her Bone Density test ordered. But they do want to wait until April to do... Put a note on order as far as date to be done Informed the daughter of PCP response and she verbalized understanidng.

## 2019-09-07 NOTE — Telephone Encounter (Signed)
Patient's daughter Jackelyn Poling states that she called last week and would like to follow up with you about her mother Bone Density test or other options.

## 2019-09-07 NOTE — Telephone Encounter (Signed)
Called the daughter left message to call back.

## 2019-09-15 MED FILL — AMOXICILLIN 875 MG TABS: 875 | 10 days supply | Qty: 20 | Fill #0

## 2019-09-17 ENCOUNTER — Ambulatory Visit (HOSPITAL_BASED_OUTPATIENT_CLINIC_OR_DEPARTMENT_OTHER)
Admission: RE | Admit: 2019-09-17 | Discharge: 2019-09-17 | Disposition: A | Discharge status: Home / Self Care | Payer: Medicare Other | Source: Ambulatory Visit | Attending: Family Medicine | Admitting: Family Medicine

## 2019-09-17 ENCOUNTER — Other Ambulatory Visit: Payer: Self-pay

## 2019-09-17 DIAGNOSIS — M810 Age-related osteoporosis without current pathological fracture: Secondary | ICD-10-CM | POA: Diagnosis not present

## 2019-09-17 DIAGNOSIS — Z780 Asymptomatic menopausal state: Secondary | ICD-10-CM | POA: Diagnosis not present

## 2019-10-01 ENCOUNTER — Encounter: Payer: Self-pay | Admitting: Family Medicine

## 2019-10-01 ENCOUNTER — Ambulatory Visit (INDEPENDENT_AMBULATORY_CARE_PROVIDER_SITE_OTHER): Payer: Medicare Other | Admitting: Family Medicine

## 2019-10-01 ENCOUNTER — Telehealth: Payer: Self-pay | Admitting: *Deleted

## 2019-10-01 ENCOUNTER — Other Ambulatory Visit: Payer: Self-pay

## 2019-10-01 VITALS — Wt 116.0 lb

## 2019-10-01 DIAGNOSIS — E782 Mixed hyperlipidemia: Secondary | ICD-10-CM | POA: Diagnosis not present

## 2019-10-01 DIAGNOSIS — M810 Age-related osteoporosis without current pathological fracture: Secondary | ICD-10-CM

## 2019-10-01 DIAGNOSIS — E559 Vitamin D deficiency, unspecified: Secondary | ICD-10-CM | POA: Diagnosis not present

## 2019-10-01 DIAGNOSIS — D509 Iron deficiency anemia, unspecified: Secondary | ICD-10-CM

## 2019-10-01 DIAGNOSIS — I10 Essential (primary) hypertension: Secondary | ICD-10-CM | POA: Diagnosis not present

## 2019-10-01 DIAGNOSIS — R739 Hyperglycemia, unspecified: Secondary | ICD-10-CM | POA: Diagnosis not present

## 2019-10-01 NOTE — Assessment & Plan Note (Signed)
Encouraged heart healthy diet, increase exercise, avoid trans fats, consider a krill oil cap daily 

## 2019-10-01 NOTE — Assessment & Plan Note (Signed)
Increase leafy greens, consider increased lean red meat and using cast iron cookware. Continue to monitor, report any concerns 

## 2019-10-01 NOTE — Telephone Encounter (Signed)
Left message machine for daughter to call back to schedule appointment.    Message sent to [facility] about Prolia appointment.

## 2019-10-01 NOTE — Assessment & Plan Note (Addendum)
Supplement and montor

## 2019-10-01 NOTE — Assessment & Plan Note (Signed)
Encouraged to get adequate exercise, calcium and vitamin d intake. Patient has been on Prolia for past 5 years and recently saw a dentist for the first time in many years and they expressed concerns that this may have contributed to a the loss/ resorption of a tooth. After long discussion she is going to receive her next shot in 6 months and have her follow up with the endodontist in August and they will discuss if the dentist has a preference in terms of medication categories and will see how stable her teeth are and then we will reassess and potentially change meds in September visit

## 2019-10-01 NOTE — Telephone Encounter (Signed)
From Dr. Charlett Blake I ordered her labs if you could just schedule her an appt next week to get them done. then she needs an appt for prolia in June and an appt with me for f/u osteoporosis in sept. also we need copies of her moderna shots given at her nursing home thanks

## 2019-10-01 NOTE — Assessment & Plan Note (Signed)
hgba1c acceptable, minimize simple carbs. Increase exercise as tolerated.  

## 2019-10-01 NOTE — Assessment & Plan Note (Signed)
Monitor and report any concerns, no changes to meds. Encouraged heart healthy diet such as the DASH diet and exercise as tolerated.  ?

## 2019-10-01 NOTE — Progress Notes (Signed)
Virtual Visit via Video Note  I connected with [name] on 10/01/19 at  8:40 AM EST by a video enabled telemedicine application and verified that I am speaking with the correct person using two identifiers.  Location: Patient: home Provider: office   I discussed the limitations of evaluation and management by telemedicine and the availability of in person appointments. The patient expressed understanding and agreed to proceed. Magdalene Molly, CMA was able to get the patient set up on a visit, video   Subjective:    Patient ID: Jennifer Wilkins, female    DOB: March 13, 1932, 86 y.o.   MRN: [id number]  Chief Complaint  Patient presents with  . Medication Problem    HPI Patient is in today for follow up accompanied by her daughter. She is doing well at her nursing facility. She has had both of her Moderna shots for COVID and tolerated them well. No recent febrile illness or hospitalizations. Her new endodontist expressed some concerns regarding Prolia but otherwise she has been doing well. Denies CP/palp/SOB/HA/congestion/fevers/GI or GU c/o. Taking meds as prescribed  Past Medical History:  Diagnosis Date  . Anemia 09/30/2016  . Arthritis   . Elevated BP 04/21/2014  . HTN (hypertension)   . Hypercholesteremia   . Hypocalcemia 06/19/2013  . Low back pain radiating to both legs 08/27/2015  . Medicare annual wellness visit, subsequent 05/03/2015  . Preventative health care 06/02/2016  . Stress reaction 04/25/2015    Past Surgical History:  Procedure Laterality Date  . CHOLECYSTECTOMY    . GALLBLADDER SURGERY  1984   Gallstone removal  . LUMBAR LAMINECTOMY/DECOMPRESSION MICRODISCECTOMY N/A 09/11/2016   Procedure: Microlumbar Decompression L3-4 and L4-5, lateral mass fusion at L4-5 using autologous, autograft bone;  Surgeon: Susa Day, MD;  Location: [facility];  Service: Orthopedics;  Laterality: N/A;    Family History  Problem Relation Age of Onset  . Osteoporosis Mother   . Ulcers  Father   . Kidney disease Paternal Grandfather     Social History   Socioeconomic History  . Marital status: Married    Spouse name: Not on file  . Number of children: Not on file  . Years of education: Not on file  . Highest education level: Not on file  Occupational History  . Not on file  Tobacco Use  . Smoking status: Never Smoker  . Smokeless tobacco: Never Used  Substance and Sexual Activity  . Alcohol use: No  . Drug use: No  . Sexual activity: Not Currently  Other Topics Concern  . Not on file  Social History Narrative   Widowed July 2017   Social Determinants of Health   Financial Resource Strain:   . Difficulty of Paying Living Expenses: Not on file  Food Insecurity:   . Worried About Charity fundraiser in the Last Year: Not on file  . Ran Out of Food in the Last Year: Not on file  Transportation Needs:   . Lack of Transportation (Medical): Not on file  . Lack of Transportation (Non-Medical): Not on file  Physical Activity:   . Days of Exercise per Week: Not on file  . Minutes of Exercise per Session: Not on file  Stress:   . Feeling of Stress : Not on file  Social Connections:   . Frequency of Communication with Friends and Family: Not on file  . Frequency of Social Gatherings with Friends and Family: Not on file  . Attends Religious Services: Not on file  .  Active Member of Clubs or Organizations: Not on file  . Attends Archivist Meetings: Not on file  . Marital Status: Not on file  Intimate Partner Violence:   . Fear of Current or Ex-Partner: Not on file  . Emotionally Abused: Not on file  . Physically Abused: Not on file  . Sexually Abused: Not on file    Outpatient Medications Prior to Visit  Medication Sig Dispense Refill  . aspirin 81 MG tablet Take 1 tablet (81 mg total) by mouth daily. Resume 4 days post-op 30 tablet   . Calcium Carbonate-Vitamin D (CALCIUM 600/VITAMIN D) 600-400 MG-UNIT per tablet Take 1 tablet by mouth 2 (two)  times daily.    . carvedilol (COREG) 12.5 MG tablet Take 1 tablet (12.5 mg total) by mouth daily. 90 tablet 3  . cholecalciferol (VITAMIN D) 1000 units tablet Take 1,000 Units by mouth daily.    . Cyanocobalamin (VITAMIN B-12 CR) 1500 MCG TBCR Take by mouth daily.    . Diclofenac Sodium (PENNSAID) 2 % SOLN Place onto the skin.    Marland Kitchen FIBER ADULT GUMMIES PO Take 2 each by mouth.    . hydrochlorothiazide (HYDRODIURIL) 25 MG tablet Take 1 tablet (25 mg total) by mouth daily. 90 tablet 3  . meclizine (ANTIVERT) 25 MG tablet Take 0.5-1 tablets (12.5-25 mg total) by mouth 3 (three) times daily as needed for dizziness. 30 tablet 1  . Methyl Salicylate-Lido-Menthol (LIDOCAINE OINT & MEN-METH SAL EX) Apply topically.    . Misc Natural Products (OSTEO BI-FLEX ADV JOINT SHIELD) TABS Take 1 tablet by mouth daily. Resume 5 days post-op    . Multiple Vitamins-Minerals (HAIR/SKIN/NAILS/BIOTIN) TABS Take 1 tablet by mouth daily. Resume 5 days post-op    . Multiple Vitamins-Minerals (MULTIVITAMIN WITH MINERALS) tablet Take 1 tablet by mouth daily. Resume 5 days post-op    . Omega-3 Fatty Acids (FISH OIL) 1000 MG CAPS Take by mouth.    . polyethylene glycol (MIRALAX / GLYCOLAX) packet Take 17 g by mouth daily. 14 each 0  . Probiotic Product (PROBIOTIC DAILY PO) Take by mouth.    . Turmeric 500 MG CAPS Take 1 capsule by mouth daily. Resume 5 days post-op as needed    . vitamin E 400 UNIT capsule Take 1 capsule (400 Units total) by mouth daily. Resume 5 days post-op     No facility-administered medications prior to visit.    No Known Allergies  Review of Systems  Constitutional: Negative for fever and malaise/fatigue.  HENT: Negative for congestion.   Eyes: Negative for blurred vision.  Respiratory: Negative for shortness of breath.   Cardiovascular: Negative for chest pain, palpitations and leg swelling.  Gastrointestinal: Negative for abdominal pain, blood in stool and nausea.  Genitourinary: Negative for  dysuria and frequency.  Musculoskeletal: Negative for falls.  Skin: Negative for rash.  Neurological: Negative for dizziness, loss of consciousness and headaches.  Endo/Heme/Allergies: Negative for environmental allergies.  Psychiatric/Behavioral: Negative for depression. The patient is not nervous/anxious.        Objective:    Physical Exam Constitutional:      Appearance: Normal appearance. She is not ill-appearing.  HENT:     Head: Normocephalic and atraumatic.  Eyes:     General:        Right eye: No discharge.        Left eye: Discharge present. Pulmonary:     Effort: Pulmonary effort is normal.  Neurological:     Mental Status: She is alert  and oriented to person, place, and time.  Psychiatric:        Behavior: Behavior normal.     Wt 116 lb (52.6 kg)   BMI 26.01 kg/m  Wt Readings from Last 3 Encounters:  10/01/19 116 lb (52.6 kg)  07/14/18 115 lb 9.6 oz (52.4 kg)  01/12/18 114 lb (51.7 kg)    Diabetic Foot Exam - Simple   No data filed     Lab Results  Component Value Date   WBC 5.2 07/14/2018   HGB 13.3 07/14/2018   HCT 40.0 07/14/2018   PLT 184.0 07/14/2018   GLUCOSE 92 07/14/2018   CHOL 170 07/14/2018   TRIG 145.0 07/14/2018   HDL 87.10 07/14/2018   LDLCALC 54 07/14/2018   ALT 17 07/14/2018   AST 20 07/14/2018   NA 139 07/14/2018   K 3.9 07/14/2018   CL 101 07/14/2018   CREATININE 0.85 07/14/2018   BUN 20 07/14/2018   CO2 27 07/14/2018   TSH 1.57 07/14/2018   HGBA1C 5.9 07/14/2018    Lab Results  Component Value Date   TSH 1.57 07/14/2018   Lab Results  Component Value Date   WBC 5.2 07/14/2018   HGB 13.3 07/14/2018   HCT 40.0 07/14/2018   MCV 87.7 07/14/2018   PLT 184.0 07/14/2018   Lab Results  Component Value Date   NA 139 07/14/2018   K 3.9 07/14/2018   CO2 27 07/14/2018   GLUCOSE 92 07/14/2018   BUN 20 07/14/2018   CREATININE 0.85 07/14/2018   BILITOT 0.7 07/14/2018   ALKPHOS 62 07/14/2018   AST 20 07/14/2018   ALT  17 07/14/2018   PROT 6.3 07/14/2018   ALBUMIN 3.9 07/14/2018   CALCIUM 9.2 07/14/2018   ANIONGAP 7 09/12/2016   GFR 67.57 07/14/2018   Lab Results  Component Value Date   CHOL 170 07/14/2018   Lab Results  Component Value Date   HDL 87.10 07/14/2018   Lab Results  Component Value Date   LDLCALC 54 07/14/2018   Lab Results  Component Value Date   TRIG 145.0 07/14/2018   Lab Results  Component Value Date   CHOLHDL 2 07/14/2018   Lab Results  Component Value Date   HGBA1C 5.9 07/14/2018       Assessment & Plan:   Problem List Items Addressed This Visit    Osteoporosis    Encouraged to get adequate exercise, calcium and vitamin d intake. Patient has been on Prolia for past 5 years and recently saw a dentist for the first time in many years and they expressed concerns that this may have contributed to a the loss/ resorption of a tooth. After long discussion she is going to receive her next shot in 6 months and have her follow up with the endodontist in August and they will discuss if the dentist has a preference in terms of medication categories and will see how stable her teeth are and then we will reassess and potentially change meds in September visit      Hyperlipidemia, mixed - Primary    Encouraged heart healthy diet, increase exercise, avoid trans fats, consider a krill oil cap daily      Relevant Orders   Lipid panel   Vitamin D deficiency    Supplement and montor      Relevant Orders   VITAMIN D 25 Hydroxy (Vit-D Deficiency, Fractures)   Essential hypertension    Monitor and report any concerns, no changes to meds. Encouraged  heart healthy diet such as the DASH diet and exercise as tolerated.       Relevant Orders   CBC   Comprehensive metabolic panel   TSH   Anemia    Increase leafy greens, consider increased lean red meat and using cast iron cookware. Continue to monitor, report any concerns      Hyperglycemia    hgba1c acceptable, minimize  simple carbs. Increase exercise as tolerated.       Relevant Orders   Hemoglobin A1c      I am having [name] maintain her Vitamin B-12 CR, Calcium Carbonate-Vitamin D, Probiotic Product (PROBIOTIC DAILY PO), FIBER ADULT GUMMIES PO, cholecalciferol, polyethylene glycol, aspirin, multivitamin with minerals, Hair/Skin/Nails/Biotin, Osteo Bi-Flex Adv Joint Shield, Turmeric, vitamin E, Methyl Salicylate-Lido-Menthol (LIDOCAINE OINT & MEN-METH SAL EX), Diclofenac Sodium, Fish Oil, meclizine, carvedilol, and hydrochlorothiazide.  No orders of the defined types were placed in this encounter.    I discussed the assessment and treatment plan with the patient. The patient was provided an opportunity to ask questions and all were answered. The patient agreed with the plan and demonstrated an understanding of the instructions.   The patient was advised to call back or seek an in-person evaluation if the symptoms worsen or if the condition fails to improve as anticipated.  I provided 25 minutes of non-face-to-face time during this encounter.   Penni Homans, MD

## 2019-10-04 NOTE — Telephone Encounter (Signed)
Appointments made

## 2019-10-07 ENCOUNTER — Telehealth: Payer: Self-pay | Admitting: Family Medicine

## 2019-10-07 ENCOUNTER — Other Ambulatory Visit: Payer: Self-pay

## 2019-10-07 ENCOUNTER — Other Ambulatory Visit (INDEPENDENT_AMBULATORY_CARE_PROVIDER_SITE_OTHER): Payer: Medicare Other

## 2019-10-07 DIAGNOSIS — I10 Essential (primary) hypertension: Secondary | ICD-10-CM

## 2019-10-07 DIAGNOSIS — E559 Vitamin D deficiency, unspecified: Secondary | ICD-10-CM

## 2019-10-07 DIAGNOSIS — R739 Hyperglycemia, unspecified: Secondary | ICD-10-CM

## 2019-10-07 DIAGNOSIS — E782 Mixed hyperlipidemia: Secondary | ICD-10-CM | POA: Diagnosis not present

## 2019-10-07 LAB — COMPREHENSIVE METABOLIC PANEL
ALT: 13 U/L (ref 0–35)
AST: 17 U/L (ref 0–37)
Albumin: 3.9 g/dL (ref 3.5–5.2)
Alkaline Phosphatase: 61 U/L (ref 39–117)
BUN: 22 mg/dL (ref 6–23)
CO2: 31 mEq/L (ref 19–32)
Calcium: 9.3 mg/dL (ref 8.4–10.5)
Chloride: 98 mEq/L (ref 96–112)
Creatinine, Ser: 0.84 mg/dL (ref 0.40–1.20)
GFR: 64.26 mL/min (ref >60.00)
Glucose, Bld: 93 mg/dL (ref 70–99)
Potassium: 3.9 mEq/L (ref 3.5–5.1)
Sodium: 136 mEq/L (ref 135–145)
Total Bilirubin: 0.7 mg/dL (ref 0.2–1.2)
Total Protein: 6.5 g/dL (ref 6.0–8.3)

## 2019-10-07 LAB — VITAMIN D 25 HYDROXY (VIT D DEFICIENCY, FRACTURES): VITD: 94.28 ng/mL (ref 30.00–100.00)

## 2019-10-07 LAB — TSH: TSH: 1.72 u[IU]/mL (ref 0.35–4.50)

## 2019-10-07 LAB — LIPID PANEL
Cholesterol: 215 mg/dL — ABNORMAL HIGH (ref 0–200)
HDL: 82 mg/dL (ref >39.00)
LDL Cholesterol: 103 mg/dL — ABNORMAL HIGH (ref 0–99)
NonHDL: 133.39
Total CHOL/HDL Ratio: 3
Triglycerides: 151 mg/dL — ABNORMAL HIGH (ref 0.0–149.0)
VLDL: 30.2 mg/dL (ref 0.0–40.0)

## 2019-10-07 LAB — HEMOGLOBIN A1C: Hgb A1c MFr Bld: 5.7 % (ref 4.6–6.5)

## 2019-10-07 LAB — CBC
HCT: 39.2 % (ref 36.0–46.0)
Hemoglobin: 13.2 g/dL (ref 12.0–15.0)
MCHC: 33.6 g/dL (ref 30.0–36.0)
MCV: 86.8 fl (ref 78.0–100.0)
Platelets: 201 10*3/uL (ref 150.0–400.0)
RBC: 4.52 Mil/uL (ref 3.87–5.11)
RDW: 14.4 % (ref 11.5–15.5)
WBC: 5.6 10*3/uL (ref 4.0–10.5)

## 2019-10-07 NOTE — Telephone Encounter (Signed)
Pt came in office and dropped of copy of her COVID-19 vaccination to be put on pt chart. Document put at front office tray under providers name.

## 2019-10-08 NOTE — Telephone Encounter (Signed)
Immunizations abstracted into chart.  No answer/no vm to let patient know.

## 2019-12-01 ENCOUNTER — Telehealth: Payer: Self-pay | Admitting: Family Medicine

## 2019-12-01 MED ORDER — HYDROCHLOROTHIAZIDE 25 MG PO TABS: 25.0000 mg | ORAL_TABLET | Freq: Every day | ORAL | 3 refills | Status: DC

## 2019-12-01 MED ORDER — CARVEDILOL 12.5 MG PO TABS: 12.5000 mg | ORAL_TABLET | Freq: Every day | ORAL | 3 refills | Status: DC

## 2019-12-01 NOTE — Telephone Encounter (Signed)
Patient notified that rxs was sent in.

## 2019-12-01 NOTE — Telephone Encounter (Signed)
Caller: Jennifer Wilkins  Call Back # [phone]   Medication: hydrochlorothiazide (HYDRODIURIL) 25 MG tablet  carvedilol (COREG) 12.5 MG tablet [id number]    Has the patient contacted their pharmacy? No. (If no, request that the patient contact the pharmacy for the refill.) (If yes, when and what did the pharmacy advise?)  Preferred Pharmacy (with phone number or street name):  [place], [place] Phone:  [phone]  Fax:  [phone]       Agent: Please be advised that RX refills may take up to 3 business days. We ask that you follow-up with your pharmacy.

## 2020-01-25 ENCOUNTER — Ambulatory Visit (INDEPENDENT_AMBULATORY_CARE_PROVIDER_SITE_OTHER): Payer: Medicare Other

## 2020-01-25 ENCOUNTER — Other Ambulatory Visit: Payer: Self-pay

## 2020-01-25 DIAGNOSIS — M810 Age-related osteoporosis without current pathological fracture: Secondary | ICD-10-CM | POA: Diagnosis not present

## 2020-01-25 MED ORDER — DENOSUMAB 60 MG/ML ~~LOC~~ SOSY
60.0000 mg | PREFILLED_SYRINGE | Freq: Once | SUBCUTANEOUS | Status: AC
  Administered 2020-01-25: 60 mg via SUBCUTANEOUS

## 2020-01-25 NOTE — Progress Notes (Addendum)
Patient here today for Prolia injection. 31mL/60mg  prolia given in left arm SQ. Patient tolerated well. Will be due for next dose 07/27/2020.   Nursing note reviewed. Agree with documention and plan.

## 2020-03-31 NOTE — Progress Notes (Signed)
I connected with [name] today by telephone and verified that I am speaking with the correct person using two identifiers. Location patient: home Location provider: work Persons participating in the virtual visit: patient, Marine scientist.    I discussed the limitations, risks, security and privacy concerns of performing an evaluation and management service by telephone and the availability of in person appointments. I also discussed with the patient that there may be a patient responsible charge related to this service. The patient expressed understanding and verbally consented to this telephonic visit.    Interactive audio and video telecommunications were attempted between this provider and patient, however failed, due to patient having technical difficulties OR patient did not have access to video capability.  We continued and completed visit with audio only.  Some vital signs may be absent or patient reported.    Subjective:   Jennifer Wilkins is a 86 y.o. female who presents for Medicare Annual (Subsequent) preventive examination.  Review of Systems    Cardiac Risk Factors include: advanced age (>10men, >77 women);dyslipidemia;hypertension     Objective:    Advanced Directives 04/04/2020 04/01/2019 01/02/2018 12/30/2016 09/11/2016 09/03/2016 04/25/2015  Does Patient Have a Medical Advance Directive? Yes Yes Yes Yes Yes Yes Yes  Type of Advance Directive Out of facility DNR (pink MOST or yellow form) Out of facility DNR (pink MOST or yellow form) [place];Living will [place];Living will [place];Living will [place];Living will [employer];Living will  Does patient want to make changes to medical advance directive? No - Patient declined - - - No - Patient declined - Yes - information given  Copy of [place] in Chart? - - Yes No - copy requested No - copy requested No - copy requested No - copy  requested    Current Medications (verified) Outpatient Encounter Medications as of 04/04/2020  Medication Sig  . aspirin 81 MG tablet Take 1 tablet (81 mg total) by mouth daily. Resume 4 days post-op  . Calcium Carbonate-Vitamin D (CALCIUM 600/VITAMIN D) 600-400 MG-UNIT per tablet Take 1 tablet by mouth 2 (two) times daily.  . carvedilol (COREG) 12.5 MG tablet Take 1 tablet (12.5 mg total) by mouth daily.  . cholecalciferol (VITAMIN D) 1000 units tablet Take 1,000 Units by mouth daily.  . Cyanocobalamin (VITAMIN B-12 CR) 1500 MCG TBCR Take by mouth daily.  . Diclofenac Sodium (PENNSAID) 2 % SOLN Place onto the skin.  Marland Kitchen FIBER ADULT GUMMIES PO Take 2 each by mouth.  . hydrochlorothiazide (HYDRODIURIL) 25 MG tablet Take 1 tablet (25 mg total) by mouth daily.  . meclizine (ANTIVERT) 25 MG tablet Take 0.5-1 tablets (12.5-25 mg total) by mouth 3 (three) times daily as needed for dizziness.  . Methyl Salicylate-Lido-Menthol (LIDOCAINE OINT & MEN-METH SAL EX) Apply topically.  . Misc Natural Products (OSTEO BI-FLEX ADV JOINT SHIELD) TABS Take 1 tablet by mouth daily. Resume 5 days post-op  . Multiple Vitamins-Minerals (HAIR/SKIN/NAILS/BIOTIN) TABS Take 1 tablet by mouth daily. Resume 5 days post-op  . Multiple Vitamins-Minerals (MULTIVITAMIN WITH MINERALS) tablet Take 1 tablet by mouth daily. Resume 5 days post-op  . Omega-3 Fatty Acids (FISH OIL) 1000 MG CAPS Take by mouth.  . polyethylene glycol (MIRALAX / GLYCOLAX) packet Take 17 g by mouth daily.  . Probiotic Product (PROBIOTIC DAILY PO) Take by mouth.  . Turmeric 500 MG CAPS Take 1 capsule by mouth daily. Resume 5 days post-op as needed  .  vitamin E 400 UNIT capsule Take 1 capsule (400 Units total) by mouth daily. Resume 5 days post-op   No facility-administered encounter medications on file as of 04/04/2020.    Allergies (verified) Patient has no known allergies.   History: Past Medical History:  Diagnosis Date  . Anemia 09/30/2016  .  Arthritis   . Elevated BP 04/21/2014  . HTN (hypertension)   . Hypercholesteremia   . Hypocalcemia 06/19/2013  . Low back pain radiating to both legs 08/27/2015  . Medicare annual wellness visit, subsequent 05/03/2015  . Preventative health care 06/02/2016  . Stress reaction 04/25/2015   Past Surgical History:  Procedure Laterality Date  . CHOLECYSTECTOMY    . GALLBLADDER SURGERY  1984   Gallstone removal  . LUMBAR LAMINECTOMY/DECOMPRESSION MICRODISCECTOMY N/A 09/11/2016   Procedure: Microlumbar Decompression L3-4 and L4-5, lateral mass fusion at L4-5 using autologous, autograft bone;  Surgeon: Susa Day, MD;  Location: [facility];  Service: Orthopedics;  Laterality: N/A;   Family History  Problem Relation Age of Onset  . Osteoporosis Mother   . Ulcers Father   . Kidney disease Paternal Grandfather    Social History   Socioeconomic History  . Marital status: Married    Spouse name: Not on file  . Number of children: Not on file  . Years of education: Not on file  . Highest education level: Not on file  Occupational History  . Not on file  Tobacco Use  . Smoking status: Never Smoker  . Smokeless tobacco: Never Used  Substance and Sexual Activity  . Alcohol use: No  . Drug use: No  . Sexual activity: Not Currently  Other Topics Concern  . Not on file  Social History Narrative   Widowed July 2017   Social Determinants of Health   Financial Resource Strain: Low Risk   . Difficulty of Paying Living Expenses: Not hard at all  Food Insecurity: No Food Insecurity  . Worried About Charity fundraiser in the Last Year: Never true  . Ran Out of Food in the Last Year: Never true  Transportation Needs: No Transportation Needs  . Lack of Transportation (Medical): No  . Lack of Transportation (Non-Medical): No  Physical Activity:   . Days of Exercise per Week: Not on file  . Minutes of Exercise per Session: Not on file  Stress:   . Feeling of Stress : Not on file  Social  Connections:   . Frequency of Communication with Friends and Family: Not on file  . Frequency of Social Gatherings with Friends and Family: Not on file  . Attends Religious Services: Not on file  . Active Member of Clubs or Organizations: Not on file  . Attends Archivist Meetings: Not on file  . Marital Status: Not on file    Tobacco Counseling Counseling given: Not Answered   Clinical Intake: Pain : No/denies pain    Activities of Daily Living In your present state of health, do you have any difficulty performing the following activities: 04/04/2020  Hearing? N  Vision? N  Difficulty concentrating or making decisions? N  Walking or climbing stairs? Y  Dressing or bathing? N  Doing errands, shopping? Y  Preparing Food and eating ? Y  Using the Toilet? N  In the past six months, have you accidently leaked urine? Y  Do you have problems with loss of bowel control? N  Managing your Medications? N  Managing your Finances? N  Housekeeping or managing your Housekeeping?  Y  Some recent data might be hidden    Patient Care Team: Mosie Lukes, MD as PCP - General (Family Medicine) Marica Otter, OD (Optometry)  Indicate any recent Medical Services you may have received from other than [facility] providers in the past year (date may be approximate).     Assessment:   This is a routine wellness examination for Jennifer Wilkins.  Dietary issues and exercise activities discussed: Current Exercise Habits: Home exercise routine, Type of exercise: walking, Time (Minutes): 30, Frequency (Times/Week): 7, Weekly Exercise (Minutes/Week): 210, Intensity: Mild, Exercise limited by: None identified Diet (meal preparation, eat out, water intake, caffeinated beverages, dairy products, fruits and vegetables): well balanced     Goals    . DIET - INCREASE WATER INTAKE    . Maintain current lifestyle.      Depression Screen PHQ 2/9 Scores 04/04/2020 04/01/2019 01/02/2018 12/30/2016 05/28/2016 04/25/2015  10/14/2014  PHQ - 2 Score 0 0 0 0 4 1 0  PHQ- 9 Score - - - - 4 - -    Fall Risk Fall Risk  04/04/2020 04/01/2019 01/02/2018 12/30/2016 05/28/2016  Falls in the past year? 0 0 No No No  Number falls in past yr: 0 - - - -  Injury with Fall? 0 - - - -  Follow up Education provided;Falls prevention discussed - - - -   Lives at [employer]. Any stairs in or around the home? No  If so, are there any without handrails? No  Home free of loose throw rugs in walkways, pet beds, electrical cords, etc? Yes  Adequate lighting in your home to reduce risk of falls? Yes   ASSISTIVE DEVICES UTILIZED TO PREVENT FALLS:  Life alert? Yes  Use of a cane, walker or w/c? Yes  Grab bars in the bathroom? Yes  Shower chair or bench in shower? Yes  Elevated toilet seat or a handicapped toilet? Yes     Cognitive Function: Ad8 score reviewed for issues:  Issues making decisions:no  Less interest in hobbies / activities:no  Repeats questions, stories (family complaining):no  Trouble using ordinary gadgets (microwave, computer, phone):no  Forgets the month or year: no  Mismanaging finances: no  Remembering appts:no Daily problems with thinking and/or memory:no Ad8 score is=0     MMSE - Mini Mental State Exam 01/02/2018 12/30/2016  Orientation to time 5 5  Orientation to Place 5 5  Registration 3 3  Attention/ Calculation 5 5  Recall 3 3  Language- name 2 objects 2 2  Language- repeat 1 1  Language- follow 3 step command 3 3  Language- read & follow direction 1 1  Write a sentence 1 1  Copy design 1 1  Total score 30 30     6CIT Screen 04/01/2019  What time? 0 points  Count back from 20 0 points  Months in reverse 0 points  Repeat phrase 0 points    Immunizations Immunization History  Administered Date(s) Administered  . Fluad Quad(high Dose 65+) 05/11/2019  . Influenza Split 04/24/2012  . Influenza, High Dose Seasonal PF 04/25/2015, 05/02/2017  . Influenza,inj,Quad PF,6+  Mos 04/21/2014, 05/02/2017  . Influenza,inj,quad, With Preservative 03/29/2017  . Influenza-Unspecified 04/24/2012, 04/28/2013, 07/29/2018  . Moderna SARS-COVID-2 Vaccination 08/10/2019, 09/07/2019  . Pneumococcal Conjugate-13 06/14/2013, 10/14/2014  . Pneumococcal Polysaccharide-23 05/30/2016  . Tdap 06/14/2013  . Zoster 07/29/2014  . Zoster Recombinat (Shingrix) 11/12/2017, 03/24/2018    TDAP status: Up to date Flu Vaccine status: Up to date Pneumococcal vaccine status:  Up to date Covid-19 vaccine status: Completed vaccines  Qualifies for Shingles Vaccine? Yes   Zostavax completed Yes   Shingrix Completed?: Yes  Screening Tests Health Maintenance  Topic Date Due  . INFLUENZA VACCINE  02/27/2020  . TETANUS/TDAP  06/15/2023  . DEXA SCAN  Completed  . COVID-19 Vaccine  Completed  . PNA vac Low Risk Adult  Completed    Health Maintenance  Health Maintenance Due  Topic Date Due  . INFLUENZA VACCINE  02/27/2020    Colorectal cancer screening: No longer required.  Mammogram status: No longer required.  Bone Density status: Completed 09/17/19. Results reflect: Bone density results: OSTEOPOROSIS. Repeat every 2 years.  Lung Cancer Screening: (Low Dose CT Chest recommended if Age 41-80 years, 30 pack-year currently smoking OR have quit w/in 15years.) does not qualify.     Additional Screening: Vision Screening: Recommended annual ophthalmology exams for early detection of glaucoma and other disorders of the eye.  Dental Screening: Recommended annual dental exams for proper oral hygiene  Community Resource Referral / Chronic Care Management: CRR required this visit?  No   CCM required this visit?  No      Plan:    Continue to eat heart healthy diet (full of fruits, vegetables, whole grains, lean protein, water--limit salt, fat, and sugar intake) and increase physical activity as tolerated. ' Continue doing brain stimulating activities (puzzles, reading, adult  coloring books, staying active) to keep memory sharp.   I have personally reviewed and noted the following in the patient's chart:   . Medical and social history . Use of alcohol, tobacco or illicit drugs  . Current medications and supplements . Functional ability and status . Nutritional status . Physical activity . Advanced directives . List of other physicians . Hospitalizations, surgeries, and ER visits in previous 12 months . Vitals . Screenings to include cognitive, depression, and falls . Referrals and appointments  In addition, I have reviewed and discussed with patient certain preventive protocols, quality metrics, and best practice recommendations. A written personalized care plan for preventive services as well as general preventive health recommendations were provided to patient.   Due to this being a telephonic visit, the after visit summary with patients personalized plan was offered to patient via mail or my-chart. Patient declined at this time.   Shela Nevin, [place]   04/04/2020

## 2020-04-04 ENCOUNTER — Ambulatory Visit (INDEPENDENT_AMBULATORY_CARE_PROVIDER_SITE_OTHER): Payer: Medicare Other | Admitting: *Deleted

## 2020-04-04 ENCOUNTER — Encounter: Payer: Self-pay | Admitting: *Deleted

## 2020-04-04 DIAGNOSIS — Z0000 Encounter for general adult medical examination without abnormal findings: Secondary | ICD-10-CM

## 2020-04-04 NOTE — Patient Instructions (Signed)
Continue to eat heart healthy diet (full of fruits, vegetables, whole grains, lean protein, water--limit salt, fat, and sugar intake) and increase physical activity as tolerated. ' Continue doing brain stimulating activities (puzzles, reading, adult coloring books, staying active) to keep memory sharp.    Jennifer Wilkins , Thank you for taking time to come for your Medicare Wellness Visit. I appreciate your ongoing commitment to your health goals. Please review the following plan we discussed and let me know if I can assist you in the future.   These are the goals we discussed: Goals    . DIET - INCREASE WATER INTAKE    . Maintain current lifestyle.       This is a list of the screening recommended for you and due dates:  Health Maintenance  Topic Date Due  . Flu Shot  02/27/2020  . Tetanus Vaccine  06/15/2023  . DEXA scan (bone density measurement)  Completed  . COVID-19 Vaccine  Completed  . Pneumonia vaccines  Completed    Preventive Care 84 Years and Older, Female Preventive care refers to lifestyle choices and visits with your health care provider that can promote health and wellness. This includes:  A yearly physical exam. This is also called an annual well check.  Regular dental and eye exams.  Immunizations.  Screening for certain conditions.  Healthy lifestyle choices, such as diet and exercise. What can I expect for my preventive care visit? Physical exam Your health care provider will check:  Height and weight. These may be used to calculate body mass index (BMI), which is a measurement that tells if you are at a healthy weight.  Heart rate and blood pressure.  Your skin for abnormal spots. Counseling Your health care provider may ask you questions about:  Alcohol, tobacco, and drug use.  Emotional well-being.  Home and relationship well-being.  Sexual activity.  Eating habits.  History of falls.  Memory and ability to understand (cognition).  Work  and work Statistician.  Pregnancy and menstrual history. What immunizations do I need?  Influenza (flu) vaccine  This is recommended every year. Tetanus, diphtheria, and pertussis (Tdap) vaccine  You may need a Td booster every 10 years. Varicella (chickenpox) vaccine  You may need this vaccine if you have not already been vaccinated. Zoster (shingles) vaccine  You may need this after age 84. Pneumococcal conjugate (PCV13) vaccine  One dose is recommended after age 84. Pneumococcal polysaccharide (PPSV23) vaccine  One dose is recommended after age 84. Measles, mumps, and rubella (MMR) vaccine  You may need at least one dose of MMR if you were born in 1957 or later. You may also need a second dose. Meningococcal conjugate (MenACWY) vaccine  You may need this if you have certain conditions. Hepatitis A vaccine  You may need this if you have certain conditions or if you travel or work in places where you may be exposed to hepatitis A. Hepatitis B vaccine  You may need this if you have certain conditions or if you travel or work in places where you may be exposed to hepatitis B. Haemophilus influenzae type b (Hib) vaccine  You may need this if you have certain conditions. You may receive vaccines as individual doses or as more than one vaccine together in one shot (combination vaccines). Talk with your health care provider about the risks and benefits of combination vaccines. What tests do I need? Blood tests  Lipid and cholesterol levels. These may be checked every 5 years,  or more frequently depending on your overall health.  Hepatitis C test.  Hepatitis B test. Screening  Lung cancer screening. You may have this screening every year starting at age 84 if you have a 30-pack-year history of smoking and currently smoke or have quit within the past 15 years.  Colorectal cancer screening. All adults should have this screening starting at age 84 and continuing until age 80.  Your health care provider may recommend screening at age 84 if you are at increased risk. You will have tests every 1-10 years, depending on your results and the type of screening test.  Diabetes screening. This is done by checking your blood sugar (glucose) after you have not eaten for a while (fasting). You may have this done every 1-3 years.  Mammogram. This may be done every 1-2 years. Talk with your health care provider about how often you should have regular mammograms.  BRCA-related cancer screening. This may be done if you have a family history of breast, ovarian, tubal, or peritoneal cancers. Other tests  Sexually transmitted disease (STD) testing.  Bone density scan. This is done to screen for osteoporosis. You may have this done starting at age 84. Follow these instructions at home: Eating and drinking  Eat a diet that includes fresh fruits and vegetables, whole grains, lean protein, and low-fat dairy products. Limit your intake of foods with high amounts of sugar, saturated fats, and salt.  Take vitamin and mineral supplements as recommended by your health care provider.  Do not drink alcohol if your health care provider tells you not to drink.  If you drink alcohol: ? Limit how much you have to 0-1 drink a day. ? Be aware of how much alcohol is in your drink. In the [place] one drink equals one 12 oz bottle of beer (355 mL), one 5 oz glass of wine (148 mL), or one 1 oz glass of hard liquor (44 mL). Lifestyle  Take daily care of your teeth and gums.  Stay active. Exercise for at least 30 minutes on 5 or more days each week.  Do not use any products that contain nicotine or tobacco, such as cigarettes, e-cigarettes, and chewing tobacco. If you need help quitting, ask your health care provider.  If you are sexually active, practice safe sex. Use a condom or other form of protection in order to prevent STIs (sexually transmitted infections).  Talk with your health care  provider about taking a low-dose aspirin or statin. What's next?  Go to your health care provider once a year for a well check visit.  Ask your health care provider how often you should have your eyes and teeth checked.  Stay up to date on all vaccines. This information is not intended to replace advice given to you by your health care provider. Make sure you discuss any questions you have with your health care provider. Document Revised: 07/09/2018 Document Reviewed: 07/09/2018 Elsevier Patient Education  2020 [employer].

## 2020-04-06 ENCOUNTER — Ambulatory Visit (INDEPENDENT_AMBULATORY_CARE_PROVIDER_SITE_OTHER): Payer: Medicare Other | Admitting: Family Medicine

## 2020-04-06 ENCOUNTER — Other Ambulatory Visit: Payer: Self-pay

## 2020-04-06 VITALS — BP 116/80 | HR 77 | Temp 97.3°F | Resp 12 | Ht <= 58 in | Wt 112.4 lb

## 2020-04-06 DIAGNOSIS — E782 Mixed hyperlipidemia: Secondary | ICD-10-CM | POA: Diagnosis not present

## 2020-04-06 DIAGNOSIS — M545 Low back pain, unspecified: Secondary | ICD-10-CM

## 2020-04-06 DIAGNOSIS — E871 Hypo-osmolality and hyponatremia: Secondary | ICD-10-CM | POA: Diagnosis not present

## 2020-04-06 DIAGNOSIS — E559 Vitamin D deficiency, unspecified: Secondary | ICD-10-CM

## 2020-04-06 DIAGNOSIS — R739 Hyperglycemia, unspecified: Secondary | ICD-10-CM

## 2020-04-06 DIAGNOSIS — D509 Iron deficiency anemia, unspecified: Secondary | ICD-10-CM | POA: Diagnosis not present

## 2020-04-06 DIAGNOSIS — I10 Essential (primary) hypertension: Secondary | ICD-10-CM

## 2020-04-06 NOTE — Patient Instructions (Signed)
Osteoporosis  Osteoporosis is thinning and loss of density in your bones. Osteoporosis makes bones more brittle and fragile and more likely to break (fracture). Over time, osteoporosis can cause your bones to become so weak that they fracture after a minor fall. Bones in the hip, wrist, and spine are most likely to fracture due to osteoporosis. What are the causes? The exact cause of this condition is not known. What increases the risk? You may be at greater risk for osteoporosis if you:  Have a family history of the condition.  Have poor nutrition.  Use steroid medicines, such as prednisone.  Are female.  Are age 84 or older.  Smoke or have a history of smoking.  Are not physically active (are sedentary).  Are white (Caucasian) or of Asian descent.  Have a small body frame.  Take certain medicines, such as antiseizure medicines. What are the signs or symptoms? A fracture might be the first sign of osteoporosis, especially if the fracture results from a fall or injury that usually would not cause a bone to break. Other signs and symptoms include:  Pain in the neck or low back.  Stooped posture.  Loss of height. How is this diagnosed? This condition may be diagnosed based on:  Your medical history.  A physical exam.  A bone mineral density test, also called a DXA or DEXA test (dual-energy X-ray absorptiometry test). This test uses X-rays to measure the amount of minerals in your bones. How is this treated? The goal of treatment is to strengthen your bones and lower your risk for a fracture. Treatment may involve:  Making lifestyle changes, such as: ? Including foods with more calcium and vitamin D in your diet. ? Doing weight-bearing and muscle-strengthening exercises. ? Stopping tobacco use. ? Limiting alcohol intake.  Taking medicine to slow the process of bone loss or to increase bone density.  Taking daily supplements of calcium and vitamin D.  Taking  hormone replacement medicines, such as estrogen for women and testosterone for men.  Monitoring your levels of calcium and vitamin D. Follow these instructions at home:  Activity  Exercise as told by your health care provider. Ask your health care provider what exercises and activities are safe for you. You should do: ? Exercises that make you work against gravity (weight-bearing exercises), such as tai chi, yoga, or walking. ? Exercises to strengthen muscles, such as lifting weights. Lifestyle  Limit alcohol intake to no more than 1 drink a day for nonpregnant women and 2 drinks a day for men. One drink equals 12 oz of beer, 5 oz of wine, or 1 oz of hard liquor.  Do not use any products that contain nicotine or tobacco, such as cigarettes and e-cigarettes. If you need help quitting, ask your health care provider. Preventing falls  Use devices to help you move around (mobility aids) as needed, such as canes, walkers, scooters, or crutches.  Keep rooms well-lit and clutter-free.  Remove tripping hazards from walkways, including cords and throw rugs.  Install grab bars in bathrooms and safety rails on stairs.  Use rubber mats in the bathroom and other areas that are often wet or slippery.  Wear closed-toe shoes that fit well and support your feet. Wear shoes that have rubber soles or low heels.  Review your medicines with your health care provider. Some medicines can cause dizziness or changes in blood pressure, which can increase your risk of falling. General instructions  Include calcium and vitamin D in  your diet. Calcium is important for bone health, and vitamin D helps your body to absorb calcium. Good sources of calcium and vitamin D include: ? Certain fatty fish, such as salmon and tuna. ? Products that have calcium and vitamin D added to them (fortified products), such as fortified cereals. ? Egg yolks. ? Cheese. ? Liver.  Take over-the-counter and prescription medicines  only as told by your health care provider.  Keep all follow-up visits as told by your health care provider. This is important. Contact a health care provider if:  You have never been screened for osteoporosis and you are: ? A woman who is age 55 or older. ? A man who is age 20 or older. Get help right away if:  You fall or injure yourself. Summary  Osteoporosis is thinning and loss of density in your bones. This makes bones more brittle and fragile and more likely to break (fracture),even with minor falls.  The goal of treatment is to strengthen your bones and reduce your risk for a fracture.  Include calcium and vitamin D in your diet. Calcium is important for bone health, and vitamin D helps your body to absorb calcium.  Talk with your health care provider about screening for osteoporosis if you are a woman who is age 48 or older, or a man who is age 15 or older. This information is not intended to replace advice given to you by your health care provider. Make sure you discuss any questions you have with your health care provider. Document Revised: 06/27/2017 Document Reviewed: 05/09/2017 Elsevier Patient Education  2020 [employer].

## 2020-04-07 LAB — COMPREHENSIVE METABOLIC PANEL
AG Ratio: 1.5 (calc) (ref 1.0–2.5)
ALT: 14 U/L (ref 6–29)
AST: 25 U/L (ref 10–35)
Albumin: 4 g/dL (ref 3.6–5.1)
Alkaline phosphatase (APISO): 59 U/L (ref 37–153)
BUN: 19 mg/dL (ref 7–25)
CO2: 27 mmol/L (ref 20–32)
Calcium: 9 mg/dL (ref 8.6–10.4)
Chloride: 98 mmol/L (ref 98–110)
Creat: 0.78 mg/dL (ref 0.60–0.88)
Globulin: 2.6 g/dL (calc) (ref 1.9–3.7)
Glucose, Bld: 90 mg/dL (ref 65–99)
Potassium: 4 mmol/L (ref 3.5–5.3)
Sodium: 134 mmol/L — ABNORMAL LOW (ref 135–146)
Total Bilirubin: 0.7 mg/dL (ref 0.2–1.2)
Total Protein: 6.6 g/dL (ref 6.1–8.1)

## 2020-04-07 LAB — HEMOGLOBIN A1C
Hgb A1c MFr Bld: 5.5 % of total Hgb (ref <5.7)
Mean Plasma Glucose: 111 (calc)
eAG (mmol/L): 6.2 (calc)

## 2020-04-07 LAB — TSH: TSH: 1.41 mIU/L (ref 0.40–4.50)

## 2020-04-07 LAB — CBC
HCT: 41.3 % (ref 35.0–45.0)
Hemoglobin: 13.8 g/dL (ref 11.7–15.5)
MCH: 29.2 pg (ref 27.0–33.0)
MCHC: 33.4 g/dL (ref 32.0–36.0)
MCV: 87.5 fL (ref 80.0–100.0)
MPV: 10.9 fL (ref 7.5–12.5)
Platelets: 203 10*3/uL (ref 140–400)
RBC: 4.72 10*6/uL (ref 3.80–5.10)
RDW: 13.1 % (ref 11.0–15.0)
WBC: 4.7 10*3/uL (ref 3.8–10.8)

## 2020-04-07 LAB — LIPID PANEL
Cholesterol: 230 mg/dL — ABNORMAL HIGH (ref <200)
HDL: 82 mg/dL (ref >=50)
LDL Cholesterol (Calc): 125 mg/dL (calc) — ABNORMAL HIGH
Non-HDL Cholesterol (Calc): 148 mg/dL (calc) — ABNORMAL HIGH (ref <130)
Total CHOL/HDL Ratio: 2.8 (calc) (ref <5.0)
Triglycerides: 120 mg/dL (ref <150)

## 2020-04-07 LAB — VITAMIN D 25 HYDROXY (VIT D DEFICIENCY, FRACTURES): Vit D, 25-Hydroxy: 60 ng/mL (ref 30–100)

## 2020-04-10 ENCOUNTER — Other Ambulatory Visit: Payer: Self-pay

## 2020-04-10 DIAGNOSIS — E871 Hypo-osmolality and hyponatremia: Secondary | ICD-10-CM | POA: Insufficient documentation

## 2020-04-10 HISTORY — DX: Hypo-osmolality and hyponatremia: E87.1

## 2020-04-10 NOTE — Progress Notes (Signed)
Patient ID: Jennifer Wilkins, female   DOB: 01/16/1932, 86 y.o.   MRN: [id number]   Subjective:    Patient ID: Jennifer Wilkins, female    DOB: Aug 05, 1931, 86 y.o.   MRN: [id number]  Chief Complaint  Patient presents with  . Follow-up    HPI Patient is in today for follow up on chronic medical concerns. No recent febrile illness or hospitalizations. She is accompanied by her grand daughter. She is doing well living at [facility]. She reports her back pain is well managed with Tylenol prn. Denies CP/palp/SOB/HA/congestion/fevers/GI or GU c/o. Taking meds as prescribed  Past Medical History:  Diagnosis Date  . Anemia 09/30/2016  . Arthritis   . Elevated BP 04/21/2014  . HTN (hypertension)   . Hypercholesteremia   . Hypocalcemia 06/19/2013  . Low back pain radiating to both legs 08/27/2015  . Medicare annual wellness visit, subsequent 05/03/2015  . Preventative health care 06/02/2016  . Stress reaction 04/25/2015    Past Surgical History:  Procedure Laterality Date  . CHOLECYSTECTOMY    . GALLBLADDER SURGERY  1984   Gallstone removal  . LUMBAR LAMINECTOMY/DECOMPRESSION MICRODISCECTOMY N/A 09/11/2016   Procedure: Microlumbar Decompression L3-4 and L4-5, lateral mass fusion at L4-5 using autologous, autograft bone;  Surgeon: Susa Day, MD;  Location: [facility];  Service: Orthopedics;  Laterality: N/A;    Family History  Problem Relation Age of Onset  . Osteoporosis Mother   . Ulcers Father   . Kidney disease Paternal Grandfather     Social History   Socioeconomic History  . Marital status: Married    Spouse name: Not on file  . Number of children: Not on file  . Years of education: Not on file  . Highest education level: Not on file  Occupational History  . Not on file  Tobacco Use  . Smoking status: Never Smoker  . Smokeless tobacco: Never Used  Substance and Sexual Activity  . Alcohol use: No  . Drug use: No  . Sexual activity: Not Currently  Other Topics Concern  . Not on  file  Social History Narrative   Widowed July 2017   Social Determinants of Health   Financial Resource Strain: Low Risk   . Difficulty of Paying Living Expenses: Not hard at all  Food Insecurity: No Food Insecurity  . Worried About Charity fundraiser in the Last Year: Never true  . Ran Out of Food in the Last Year: Never true  Transportation Needs: No Transportation Needs  . Lack of Transportation (Medical): No  . Lack of Transportation (Non-Medical): No  Physical Activity:   . Days of Exercise per Week: Not on file  . Minutes of Exercise per Session: Not on file  Stress:   . Feeling of Stress : Not on file  Social Connections:   . Frequency of Communication with Friends and Family: Not on file  . Frequency of Social Gatherings with Friends and Family: Not on file  . Attends Religious Services: Not on file  . Active Member of Clubs or Organizations: Not on file  . Attends Archivist Meetings: Not on file  . Marital Status: Not on file  Intimate Partner Violence:   . Fear of Current or Ex-Partner: Not on file  . Emotionally Abused: Not on file  . Physically Abused: Not on file  . Sexually Abused: Not on file    Outpatient Medications Prior to Visit  Medication Sig Dispense Refill  . aspirin 81 MG  tablet Take 1 tablet (81 mg total) by mouth daily. Resume 4 days post-op 30 tablet   . Calcium Carbonate-Vitamin D (CALCIUM 600/VITAMIN D) 600-400 MG-UNIT per tablet Take 1 tablet by mouth 2 (two) times daily.    . carvedilol (COREG) 12.5 MG tablet Take 1 tablet (12.5 mg total) by mouth daily. 90 tablet 3  . Cyanocobalamin (VITAMIN B-12 CR) 1500 MCG TBCR Take by mouth daily.    . Diclofenac Sodium (PENNSAID) 2 % SOLN Place onto the skin.    Marland Kitchen FIBER ADULT GUMMIES PO Take 2 each by mouth.    . hydrochlorothiazide (HYDRODIURIL) 25 MG tablet Take 1 tablet (25 mg total) by mouth daily. 90 tablet 3  . meclizine (ANTIVERT) 25 MG tablet Take 0.5-1 tablets (12.5-25 mg total) by  mouth 3 (three) times daily as needed for dizziness. 30 tablet 1  . Methyl Salicylate-Lido-Menthol (LIDOCAINE OINT & MEN-METH SAL EX) Apply topically.    . Misc Natural Products (OSTEO BI-FLEX ADV JOINT SHIELD) TABS Take 1 tablet by mouth daily. Resume 5 days post-op    . Multiple Vitamins-Minerals (HAIR/SKIN/NAILS/BIOTIN) TABS Take 1 tablet by mouth daily. Resume 5 days post-op    . Multiple Vitamins-Minerals (MULTIVITAMIN WITH MINERALS) tablet Take 1 tablet by mouth daily. Resume 5 days post-op    . Omega-3 Fatty Acids (FISH OIL) 1000 MG CAPS Take by mouth.    . polyethylene glycol (MIRALAX / GLYCOLAX) packet Take 17 g by mouth daily. 14 each 0  . Probiotic Product (PROBIOTIC DAILY PO) Take by mouth.    . Turmeric 500 MG CAPS Take 1 capsule by mouth daily. Resume 5 days post-op as needed    . vitamin E 400 UNIT capsule Take 1 capsule (400 Units total) by mouth daily. Resume 5 days post-op    . cholecalciferol (VITAMIN D) 1000 units tablet Take 1,000 Units by mouth daily.     No facility-administered medications prior to visit.    No Known Allergies  Review of Systems  Constitutional: Negative for fever and malaise/fatigue.  HENT: Negative for congestion.   Eyes: Negative for blurred vision.  Respiratory: Negative for shortness of breath.   Cardiovascular: Negative for chest pain, palpitations and leg swelling.  Gastrointestinal: Negative for abdominal pain, blood in stool and nausea.  Genitourinary: Negative for dysuria and frequency.  Musculoskeletal: Negative for falls.  Skin: Negative for rash.  Neurological: Negative for dizziness, loss of consciousness and headaches.  Endo/Heme/Allergies: Negative for environmental allergies.  Psychiatric/Behavioral: Negative for depression. The patient is not nervous/anxious.        Objective:    Physical Exam Vitals and nursing note reviewed.  Constitutional:      General: She is not in acute distress.    Appearance: She is  well-developed.  HENT:     Head: Normocephalic and atraumatic.     Nose: Nose normal.  Eyes:     General:        Right eye: No discharge.        Left eye: No discharge.  Cardiovascular:     Rate and Rhythm: Normal rate and regular rhythm.     Heart sounds: No murmur heard.   Pulmonary:     Effort: Pulmonary effort is normal.     Breath sounds: Normal breath sounds.  Abdominal:     General: Bowel sounds are normal.     Palpations: Abdomen is soft.     Tenderness: There is no abdominal tenderness.  Musculoskeletal:     Cervical back:  Normal range of motion and neck supple.  Skin:    General: Skin is warm and dry.  Neurological:     Mental Status: She is alert and oriented to person, place, and time.     BP 116/80 (BP Location: Left Arm, Patient Position: Sitting, Cuff Size: Large)   Pulse 77   Temp (!) 97.3 F (36.3 C) (Oral)   Resp 12   Ht 4\' 8"  (1.422 m)   Wt 112 lb 6.4 oz (51 kg)   SpO2 99%   BMI 25.20 kg/m  Wt Readings from Last 3 Encounters:  04/06/20 112 lb 6.4 oz (51 kg)  10/01/19 116 lb (52.6 kg)  07/14/18 115 lb 9.6 oz (52.4 kg)    Diabetic Foot Exam - Simple   No data filed     Lab Results  Component Value Date   WBC 4.7 04/06/2020   HGB 13.8 04/06/2020   HCT 41.3 04/06/2020   PLT 203 04/06/2020   GLUCOSE 90 04/06/2020   CHOL 230 (H) 04/06/2020   TRIG 120 04/06/2020   HDL 82 04/06/2020   LDLCALC 125 (H) 04/06/2020   ALT 14 04/06/2020   AST 25 04/06/2020   NA 134 (L) 04/06/2020   K 4.0 04/06/2020   CL 98 04/06/2020   CREATININE 0.78 04/06/2020   BUN 19 04/06/2020   CO2 27 04/06/2020   TSH 1.41 04/06/2020   HGBA1C 5.5 04/06/2020    Lab Results  Component Value Date   TSH 1.41 04/06/2020   Lab Results  Component Value Date   WBC 4.7 04/06/2020   HGB 13.8 04/06/2020   HCT 41.3 04/06/2020   MCV 87.5 04/06/2020   PLT 203 04/06/2020   Lab Results  Component Value Date   NA 134 (L) 04/06/2020   K 4.0 04/06/2020   CO2 27 04/06/2020    GLUCOSE 90 04/06/2020   BUN 19 04/06/2020   CREATININE 0.78 04/06/2020   BILITOT 0.7 04/06/2020   ALKPHOS 61 10/07/2019   AST 25 04/06/2020   ALT 14 04/06/2020   PROT 6.6 04/06/2020   ALBUMIN 3.9 10/07/2019   CALCIUM 9.0 04/06/2020   ANIONGAP 7 09/12/2016   GFR 64.26 10/07/2019   Lab Results  Component Value Date   CHOL 230 (H) 04/06/2020   Lab Results  Component Value Date   HDL 82 04/06/2020   Lab Results  Component Value Date   LDLCALC 125 (H) 04/06/2020   Lab Results  Component Value Date   TRIG 120 04/06/2020   Lab Results  Component Value Date   CHOLHDL 2.8 04/06/2020   Lab Results  Component Value Date   HGBA1C 5.5 04/06/2020       Assessment & Plan:   Problem List Items Addressed This Visit    Hyperlipidemia, mixed - Primary    Encouraged heart healthy diet, increase exercise, avoid trans fats, consider a krill oil cap daily      Relevant Orders   Lipid panel (Completed)   Vitamin D deficiency    Supplement and monitor      Relevant Orders   VITAMIN D 25 Hydroxy (Vit-D Deficiency, Fractures) (Completed)   VITAMIN D 25 Hydroxy (Vit-D Deficiency, Fractures)   Essential hypertension    Well controlled, no changes to meds. Encouraged heart healthy diet such as the DASH diet and exercise as tolerated.       Relevant Orders   TSH (Completed)   Low back pain radiating to both legs    Encouraged moist heat  and gentle stretching as tolerated. May try NSAIDs and prescription meds as directed and report if symptoms worsen or seek immediate care. Getting adequate relief from OTC Tylenol.       Anemia   Relevant Orders   CBC (Completed)   Hyperglycemia    hgba1c acceptable, minimize simple carbs. Increase exercise as tolerated.       Relevant Orders   Hemoglobin A1c (Completed)   Comprehensive metabolic panel (Completed)   Hyponatremia    Mild and asymptomatic, repeat CMP and decrease water intake by one beverage a day.          I have  discontinued Justine Null. Manasco's cholecalciferol. I am also having her maintain her Vitamin B-12 CR, Calcium Carbonate-Vitamin D, Probiotic Product (PROBIOTIC DAILY PO), FIBER ADULT GUMMIES PO, polyethylene glycol, aspirin, multivitamin with minerals, Hair/Skin/Nails/Biotin, Osteo Bi-Flex Adv Joint Shield, Turmeric, vitamin E, Methyl Salicylate-Lido-Menthol (LIDOCAINE OINT & MEN-METH SAL EX), Diclofenac Sodium, Fish Oil, meclizine, carvedilol, and hydrochlorothiazide.  No orders of the defined types were placed in this encounter.    Penni Homans, MD

## 2020-04-10 NOTE — Assessment & Plan Note (Signed)
Encouraged moist heat and gentle stretching as tolerated. May try NSAIDs and prescription meds as directed and report if symptoms worsen or seek immediate care. Getting adequate relief from OTC Tylenol.

## 2020-04-10 NOTE — Assessment & Plan Note (Signed)
Encouraged heart healthy diet, increase exercise, avoid trans fats, consider a krill oil cap daily 

## 2020-04-10 NOTE — Assessment & Plan Note (Signed)
hgba1c acceptable, minimize simple carbs. Increase exercise as tolerated.  

## 2020-04-10 NOTE — Assessment & Plan Note (Signed)
Supplement and monitor 

## 2020-04-10 NOTE — Assessment & Plan Note (Signed)
Well controlled, no changes to meds. Encouraged heart healthy diet such as the DASH diet and exercise as tolerated.  °

## 2020-04-10 NOTE — Progress Notes (Signed)
Pt. Has been notified and has scheduled a repeat cmp recheck on October 4th.@9 :30 am per provider.

## 2020-04-10 NOTE — Assessment & Plan Note (Signed)
Mild and asymptomatic, repeat CMP and decrease water intake by one beverage a day.

## 2020-04-27 NOTE — Addendum Note (Signed)
Addended by: Matai Carpenito A on: 04/27/2020 09:54 AM   Modules accepted: Orders  

## 2020-05-01 ENCOUNTER — Other Ambulatory Visit: Payer: Medicare Other

## 2020-05-01 ENCOUNTER — Other Ambulatory Visit: Payer: Self-pay

## 2020-05-01 DIAGNOSIS — Z23 Encounter for immunization: Secondary | ICD-10-CM | POA: Diagnosis not present

## 2020-05-01 DIAGNOSIS — E871 Hypo-osmolality and hyponatremia: Secondary | ICD-10-CM

## 2020-05-01 LAB — COMPREHENSIVE METABOLIC PANEL
AG Ratio: 1.6 (calc) (ref 1.0–2.5)
ALT: 14 U/L (ref 6–29)
AST: 17 U/L (ref 10–35)
Albumin: 4.1 g/dL (ref 3.6–5.1)
Alkaline phosphatase (APISO): 64 U/L (ref 37–153)
BUN: 24 mg/dL (ref 7–25)
CO2: 29 mmol/L (ref 20–32)
Calcium: 9.6 mg/dL (ref 8.6–10.4)
Chloride: 98 mmol/L (ref 98–110)
Creat: 0.83 mg/dL (ref 0.60–0.88)
Globulin: 2.5 g/dL (calc) (ref 1.9–3.7)
Glucose, Bld: 90 mg/dL (ref 65–99)
Potassium: 4.4 mmol/L (ref 3.5–5.3)
Sodium: 136 mmol/L (ref 135–146)
Total Bilirubin: 0.6 mg/dL (ref 0.2–1.2)
Total Protein: 6.6 g/dL (ref 6.1–8.1)

## 2020-05-03 ENCOUNTER — Encounter: Payer: Self-pay | Admitting: *Deleted

## 2020-06-19 ENCOUNTER — Telehealth: Payer: Self-pay | Admitting: Family Medicine

## 2020-06-19 NOTE — Telephone Encounter (Signed)
Patient calling in regards to prolia.

## 2020-06-19 NOTE — Telephone Encounter (Signed)
Sent patient letter. She is due 12/30 or later. Last injection 01/25/20.

## 2020-08-01 ENCOUNTER — Other Ambulatory Visit: Payer: Self-pay

## 2020-08-01 ENCOUNTER — Ambulatory Visit (INDEPENDENT_AMBULATORY_CARE_PROVIDER_SITE_OTHER): Payer: Medicare Other

## 2020-08-01 DIAGNOSIS — E559 Vitamin D deficiency, unspecified: Secondary | ICD-10-CM

## 2020-08-01 MED ORDER — CYANOCOBALAMIN 1000 MCG/ML IJ SOLN: 1000.0000 ug | Freq: Once | INTRAMUSCULAR | Status: DC

## 2020-08-01 MED ORDER — DENOSUMAB 60 MG/ML ~~LOC~~ SOSY
60.0000 mg | PREFILLED_SYRINGE | Freq: Once | SUBCUTANEOUS | Status: AC
  Administered 2020-08-01: 60 mg via SUBCUTANEOUS

## 2020-08-01 NOTE — Progress Notes (Addendum)
Pt is here today prolia injection. Pt was given prolia injection was given in left arm subq. Pt tolerated injection well.  Nursing note reviewed. Agree with documention and plan.

## 2020-10-09 ENCOUNTER — Encounter: Payer: Medicare Other | Admitting: Medical

## 2020-10-30 ENCOUNTER — Other Ambulatory Visit: Payer: Self-pay | Admitting: Family Medicine

## 2020-11-21 DIAGNOSIS — L728 Other follicular cysts of the skin and subcutaneous tissue: Secondary | ICD-10-CM | POA: Diagnosis not present

## 2020-11-21 DIAGNOSIS — B078 Other viral warts: Secondary | ICD-10-CM | POA: Diagnosis not present

## 2020-12-26 ENCOUNTER — Other Ambulatory Visit: Payer: Self-pay

## 2020-12-26 ENCOUNTER — Ambulatory Visit (INDEPENDENT_AMBULATORY_CARE_PROVIDER_SITE_OTHER): Payer: Medicare Other | Admitting: Family Medicine

## 2020-12-26 VITALS — BP 122/80 | HR 81 | Temp 98.2°F | Resp 12 | Ht <= 58 in | Wt 108.6 lb

## 2020-12-26 DIAGNOSIS — R739 Hyperglycemia, unspecified: Secondary | ICD-10-CM

## 2020-12-26 DIAGNOSIS — M810 Age-related osteoporosis without current pathological fracture: Secondary | ICD-10-CM

## 2020-12-26 DIAGNOSIS — E782 Mixed hyperlipidemia: Secondary | ICD-10-CM

## 2020-12-26 DIAGNOSIS — I10 Essential (primary) hypertension: Secondary | ICD-10-CM | POA: Diagnosis not present

## 2020-12-26 DIAGNOSIS — E559 Vitamin D deficiency, unspecified: Secondary | ICD-10-CM

## 2020-12-26 LAB — CBC
HCT: 39.5 % (ref 36.0–46.0)
Hemoglobin: 13.2 g/dL (ref 12.0–15.0)
MCHC: 33.4 g/dL (ref 30.0–36.0)
MCV: 87.6 fl (ref 78.0–100.0)
Platelets: 187 10*3/uL (ref 150.0–400.0)
RBC: 4.51 Mil/uL (ref 3.87–5.11)
RDW: 15.1 % (ref 11.5–15.5)
WBC: 5.6 10*3/uL (ref 4.0–10.5)

## 2020-12-26 LAB — COMPREHENSIVE METABOLIC PANEL
ALT: 14 U/L (ref 0–35)
AST: 17 U/L (ref 0–37)
Albumin: 3.9 g/dL (ref 3.5–5.2)
Alkaline Phosphatase: 58 U/L (ref 39–117)
BUN: 25 mg/dL — ABNORMAL HIGH (ref 6–23)
CO2: 29 mEq/L (ref 19–32)
Calcium: 8.9 mg/dL (ref 8.4–10.5)
Chloride: 98 mEq/L (ref 96–112)
Creatinine, Ser: 0.83 mg/dL (ref 0.40–1.20)
GFR: 63.39 mL/min (ref >60.00)
Glucose, Bld: 91 mg/dL (ref 70–99)
Potassium: 3.9 mEq/L (ref 3.5–5.1)
Sodium: 136 mEq/L (ref 135–145)
Total Bilirubin: 0.7 mg/dL (ref 0.2–1.2)
Total Protein: 6.3 g/dL (ref 6.0–8.3)

## 2020-12-26 LAB — TSH: TSH: 1.03 u[IU]/mL (ref 0.35–4.50)

## 2020-12-26 LAB — LIPID PANEL
Cholesterol: 215 mg/dL — ABNORMAL HIGH (ref 0–200)
HDL: 85.2 mg/dL (ref >39.00)
LDL Cholesterol: 116 mg/dL — ABNORMAL HIGH (ref 0–99)
NonHDL: 129.91
Total CHOL/HDL Ratio: 3
Triglycerides: 71 mg/dL (ref 0.0–149.0)
VLDL: 14.2 mg/dL (ref 0.0–40.0)

## 2020-12-26 LAB — VITAMIN D 25 HYDROXY (VIT D DEFICIENCY, FRACTURES): VITD: 74.96 ng/mL (ref 30.00–100.00)

## 2020-12-26 LAB — HEMOGLOBIN A1C: Hgb A1c MFr Bld: 6 % (ref 4.6–6.5)

## 2020-12-26 NOTE — Patient Instructions (Signed)
Osteoporosis  Osteoporosis happens when the bones become thin and less dense than normal. Osteoporosis makes bones more brittle and fragile and more likely to break (fracture). Over time, osteoporosis can cause your bones to become so weak that they fracture after a minor fall. Bones in the hip, wrist, and spine are most likely to fracture due to osteoporosis. What are the causes? The exact cause of this condition is not known. What increases the risk? You are more likely to develop this condition if you:  Have family members with this condition.  Have poor nutrition.  Use the following: ? Steroid medicines, such as prednisone. ? Anti-seizure medicines. ? Nicotine or tobacco, such as cigarettes, e-cigarettes, and chewing tobacco.  Are female.  Are age 50 or older.  Are not physically active (are sedentary).  Are of European or Asian descent.  Have a small body frame. What are the signs or symptoms? A fracture might be the first sign of osteoporosis, especially if the fracture results from a fall or injury that usually would not cause a bone to break. Other signs and symptoms include:  Pain in the neck or low back.  Stooped posture.  Loss of height. How is this diagnosed? This condition may be diagnosed based on:  Your medical history.  A physical exam.  A bone mineral density test, also called a DXA or DEXA test (dual-energy X-ray absorptiometry test). This test uses X-rays to measure the amount of minerals in your bones. How is this treated? This condition may be treated by:  Making lifestyle changes, such as: ? Including foods with more calcium and vitamin D in your diet. ? Doing weight-bearing and muscle-strengthening exercises. ? Stopping tobacco use. ? Limiting alcohol intake.  Taking medicine to slow the process of bone loss or to increase bone density.  Taking daily supplements of calcium and vitamin D.  Taking hormone replacement medicines, such as  estrogen for women and testosterone for men.  Monitoring your levels of calcium and vitamin D. The goal of treatment is to strengthen your bones and lower your risk for a fracture. Follow these instructions at home: Eating and drinking Include calcium and vitamin D in your diet. Calcium is important for bone health, and vitamin D helps your body absorb calcium. Good sources of calcium and vitamin D include:  Certain fatty fish, such as salmon and tuna.  Products that have calcium and vitamin D added to them (are fortified), such as fortified cereals.  Egg yolks.  Cheese.  Liver.   Activity Do exercises as told by your health care provider. Ask your health care provider what exercises and activities are safe for you. You should do:  Exercises that make you work against gravity (weight-bearing exercises), such as tai chi, yoga, or walking.  Exercises to strengthen muscles, such as lifting weights. Lifestyle  Do not drink alcohol if: ? Your health care provider tells you not to drink. ? You are pregnant, may be pregnant, or are planning to become pregnant.  If you drink alcohol: ? Limit how much you use to:  0-1 drink a day for women.  0-2 drinks a day for men.  Know how much alcohol is in your drink. In the [place] one drink equals one 12 oz bottle of beer (355 mL), one 5 oz glass of wine (148 mL), or one 1 oz glass of hard liquor (44 mL).  Do not use any products that contain nicotine or tobacco, such as cigarettes, e-cigarettes, and chewing tobacco.   If you need help quitting, ask your health care provider. Preventing falls  Use devices to help you move around (mobility aids) as needed, such as canes, walkers, scooters, or crutches.  Keep rooms well-lit and clutter-free.  Remove tripping hazards from walkways, including cords and throw rugs.  Install grab bars in bathrooms and safety rails on stairs.  Use rubber mats in the bathroom and other areas that are often wet or  slippery.  Wear closed-toe shoes that fit well and support your feet. Wear shoes that have rubber soles or low heels.  Review your medicines with your health care provider. Some medicines can cause dizziness or changes in blood pressure, which can increase your risk of falling. General instructions  Take over-the-counter and prescription medicines only as told by your health care provider.  Keep all follow-up visits. This is important. Contact a health care provider if:  You have never been screened for osteoporosis and you are: ? A woman who is age 65 or older. ? A man who is age 70 or older. Get help right away if:  You fall or injure yourself. Summary  Osteoporosis is thinning and loss of density in your bones. This makes bones more brittle and fragile and more likely to break (fracture),even with minor falls.  The goal of treatment is to strengthen your bones and lower your risk for a fracture.  Include calcium and vitamin D in your diet. Calcium is important for bone health, and vitamin D helps your body absorb calcium.  Talk with your health care provider about screening for osteoporosis if you are a woman who is age 65 or older, or a man who is age 70 or older. This information is not intended to replace advice given to you by your health care provider. Make sure you discuss any questions you have with your health care provider. Document Revised: 12/30/2019 Document Reviewed: 12/30/2019 Elsevier Patient Education  2021 [employer]  

## 2020-12-26 NOTE — Assessment & Plan Note (Signed)
hgba1c acceptable, minimize simple carbs. Increase exercise as tolerated.  

## 2020-12-26 NOTE — Assessment & Plan Note (Addendum)
Encouraged heart healthy diet, increase exercise, avoid trans fats, consider a krill oil cap daily 

## 2020-12-26 NOTE — Assessment & Plan Note (Signed)
Encouraged to get adequate exercise, calcium and vitamin d intake. Check vitamin d and continue Prolia every 6 months, due in July

## 2020-12-26 NOTE — Progress Notes (Signed)
Patient ID: Jennifer Wilkins, female    DOB: May 27, 1932  Age: 87 y.o. MRN: [id number]    Subjective:  Subjective  HPI Jennifer Wilkins presents for office visit today for follow up on osteoporosis and hyperlipidemia. She states that she is doing well and has no recent sicknesses or hospitalizations to report. She denies any chest pain, SOB, fever, abdominal pain, cough, chills, sore throat, dysuria, urinary incontinence, back pain, HA, or N/VD. She endorses walking a lot as a physical activity.   Review of Systems  Constitutional: Negative for chills, fatigue and fever.  HENT: Negative for congestion, rhinorrhea, sinus pressure, sinus pain and sore throat.   Eyes: Negative for pain.  Respiratory: Negative for cough and shortness of breath.   Cardiovascular: Negative for chest pain, palpitations and leg swelling.  Gastrointestinal: Negative for abdominal pain, blood in stool, diarrhea, nausea and vomiting.  Genitourinary: Negative for decreased urine volume, flank pain, frequency, vaginal bleeding and vaginal discharge.  Musculoskeletal: Negative for back pain.  Neurological: Negative for headaches.    History Past Medical History:  Diagnosis Date  . Anemia 09/30/2016  . Arthritis   . Elevated BP 04/21/2014  . HTN (hypertension)   . Hypercholesteremia   . Hypocalcemia 06/19/2013  . Low back pain radiating to both legs 08/27/2015  . Medicare annual wellness visit, subsequent 05/03/2015  . Preventative health care 06/02/2016  . Stress reaction 04/25/2015    She has a past surgical history that includes Gallbladder surgery (1984); Cholecystectomy; and Lumbar laminectomy/decompression microdiscectomy (N/A, 09/11/2016).   Her family history includes Kidney disease in her paternal grandfather; Osteoporosis in her mother; Ulcers in her father.She reports that she has never smoked. She has never used smokeless tobacco. She reports that she does not drink alcohol and does not use drugs.  Current Outpatient  Medications on File Prior to Visit  Medication Sig Dispense Refill  . aspirin 81 MG tablet Take 1 tablet (81 mg total) by mouth daily. Resume 4 days post-op 30 tablet   . Calcium Carbonate-Vitamin D 600-400 MG-UNIT tablet Take 1 tablet by mouth 2 (two) times daily.    . carvedilol (COREG) 12.5 MG tablet Take 1 tablet (12.5 mg total) by mouth daily. 90 tablet 1  . Cyanocobalamin (VITAMIN B-12 CR) 1500 MCG TBCR Take by mouth daily.    . Diclofenac Sodium 2 % SOLN Place onto the skin.    Marland Kitchen FIBER ADULT GUMMIES PO Take 2 each by mouth.    . hydrochlorothiazide (HYDRODIURIL) 25 MG tablet Take 1 tablet (25 mg total) by mouth daily. 90 tablet 1  . meclizine (ANTIVERT) 25 MG tablet Take 0.5-1 tablets (12.5-25 mg total) by mouth 3 (three) times daily as needed for dizziness. 30 tablet 1  . Methyl Salicylate-Lido-Menthol (LIDOCAINE OINT & MEN-METH SAL EX) Apply topically.    . Misc Natural Products (OSTEO BI-FLEX ADV JOINT SHIELD) TABS Take 1 tablet by mouth daily. Resume 5 days post-op    . Multiple Vitamins-Minerals (HAIR/SKIN/NAILS/BIOTIN) TABS Take 1 tablet by mouth daily. Resume 5 days post-op    . Multiple Vitamins-Minerals (MULTIVITAMIN WITH MINERALS) tablet Take 1 tablet by mouth daily. Resume 5 days post-op    . Omega-3 Fatty Acids (FISH OIL) 1000 MG CAPS Take by mouth.    . polyethylene glycol (MIRALAX / GLYCOLAX) packet Take 17 g by mouth daily. 14 each 0  . Probiotic Product (PROBIOTIC DAILY PO) Take by mouth.    . Turmeric 500 MG CAPS Take 1 capsule by mouth daily.  Resume 5 days post-op as needed    . vitamin E 400 UNIT capsule Take 1 capsule (400 Units total) by mouth daily. Resume 5 days post-op     No current facility-administered medications on file prior to visit.     Objective:  Objective  Physical Exam Constitutional:      General: She is not in acute distress.    Appearance: Normal appearance. She is not ill-appearing or toxic-appearing.  HENT:     Head: Normocephalic and  atraumatic.     Right Ear: Tympanic membrane, ear canal and external ear normal.     Left Ear: Tympanic membrane, ear canal and external ear normal.     Nose: No congestion or rhinorrhea.  Eyes:     Extraocular Movements: Extraocular movements intact.     Pupils: Pupils are equal, round, and reactive to light.  Cardiovascular:     Rate and Rhythm: Normal rate and regular rhythm.     Pulses: Normal pulses.     Heart sounds: Normal heart sounds. No murmur heard.   Pulmonary:     Effort: Pulmonary effort is normal. No respiratory distress.     Breath sounds: Normal breath sounds. No wheezing, rhonchi or rales.  Abdominal:     General: Bowel sounds are normal.     Palpations: Abdomen is soft. There is no mass.     Tenderness: There is no abdominal tenderness. There is no guarding.     Hernia: No hernia is present.  Musculoskeletal:        General: Normal range of motion.     Cervical back: Normal range of motion and neck supple.  Skin:    General: Skin is warm and dry.  Neurological:     Mental Status: She is alert and oriented to person, place, and time.  Psychiatric:        Behavior: Behavior normal.    BP 122/80 (BP Location: Left Arm, Cuff Size: Normal)   Pulse 81   Temp 98.2 F (36.8 C) (Oral)   Resp 12   Ht 4\' 8"  (1.422 m)   Wt 108 lb 9.6 oz (49.3 kg)   SpO2 97%   BMI 24.35 kg/m  Wt Readings from Last 3 Encounters:  12/26/20 108 lb 9.6 oz (49.3 kg)  04/06/20 112 lb 6.4 oz (51 kg)  10/01/19 116 lb (52.6 kg)     Lab Results  Component Value Date   WBC 5.6 12/26/2020   HGB 13.2 12/26/2020   HCT 39.5 12/26/2020   PLT 187.0 12/26/2020   GLUCOSE 91 12/26/2020   CHOL 215 (H) 12/26/2020   TRIG 71.0 12/26/2020   HDL 85.20 12/26/2020   LDLCALC 116 (H) 12/26/2020   ALT 14 12/26/2020   AST 17 12/26/2020   NA 136 12/26/2020   K 3.9 12/26/2020   CL 98 12/26/2020   CREATININE 0.83 12/26/2020   BUN 25 (H) 12/26/2020   CO2 29 12/26/2020   TSH 1.03 12/26/2020    HGBA1C 6.0 12/26/2020    DG Bone Density  Result Date: 09/17/2019 EXAM: DUAL X-RAY ABSORPTIOMETRY (DXA) FOR BONE MINERAL DENSITY IMPRESSION: Your patient Jennifer Wilkins completed a BMD test on 09/17/2019 using the [place] (analysis version: 16.SP2) manufactured by [employer]. The following summarizes the results of our evaluation. PATIENT: Name: Jennifer Wilkins, Jennifer Wilkins Patient ID: [id number] Birth Date: [birth year] Height: 56.0 in. Gender: Female Measured: 09/17/2019 Weight: 115.6 lbs. Indications: Advanced Age, Asian, Estrogen Deficiency, Height Loss, History of Osteoporosis, Post Menopausal  Fractures: Treatments: Calcium, Multivitamin, Prolia, Vitamin D ASSESSMENT: The BMD measured at Forearm Radius 33% is 0.582 g/cm2 with a T-score of -3.4. This patient is considered osteoporotic according to [facility]) criteria. L-3 and L-4 was excluded due to degenerative changes. Compared with the prior study on 07/11/2017, the BMD of the total mean hip shows no statistically signficant change. The scan quality is good. Site Region Measured Date Measured Age WHO YA BMD Classification T-score AP Spine L1-L2 09/17/2019 86.1 Normal -0.8 1.071 g/cm2 AP Spine L1-L2 07/11/2017 83.9 Osteopenia -1.1 1.031 g/cm2 DualFemur Total Mean 09/17/2019 86.1 Osteopenia -2.1 0.747 g/cm2 DualFemur Total Mean 07/11/2017 83.9 Osteopenia -2.3 0.724 g/cm2 Left Forearm Radius 33% 09/17/2019 86.1 Osteoporosis -3.4 0.582 g/cm2 World Health Organization [facility]) criteria for post-menopausal, Caucasian Women: Normal       T-score at or above -1 SD Osteopenia   T-score between -1 and -2.5 SD Osteoporosis T-score at or below -2.5 SD RECOMMENDATION: 1. All patients should optimize calcium and vitamin D intake. 2. Consider FDA-approved medical therapies in postmenopausal women and men aged 84 years and older, based on the following: a. A hip or vertebral(clinical or morphometric) fracture. b. T-Score < -2.5 at the femoral neck or spine  after appropriate evaluation to exclude secondary causes c. Low bone mass (T-score between -1.0 and -2.5 at the femoral neck or spine) and a 10 year probability of a hip fracture >3% or a 10 year probability of major osteoporosis-related fracture > 20% based on the [place]-adapted WHO algorithm d. Clinical judgement and/or patient preferences may indicate treatment for people with 10-year fracture probabilities above or below these levels FOLLOW-UP: Patients with diagnosis of osteoporosis or at high risk for fracture should have regular bone mineral density tests. For patients eligible for Medicare, routine testing is allowed once every 2 years. The testing frequency can be increased to one year for patients who have rapidly progressing disease, those who are receiving or discontinuing medical therapy to restore bone mass, or have additional risk factors. Electronically Signed   By: Marlaine Hind M.D.   On: 09/17/2019 11:12     Assessment & Plan:  Plan    No orders of the defined types were placed in this encounter.   Problem List Items Addressed This Visit    Osteoporosis - Primary    Encouraged to get adequate exercise, calcium and vitamin d intake. Check vitamin d and continue Prolia every 6 months, due in July      Relevant Orders   VITAMIN D 25 Hydroxy (Vit-D Deficiency, Fractures) (Completed)   Hyperlipidemia, mixed    Encouraged heart healthy diet, increase exercise, avoid trans fats, consider a krill oil cap daily      Relevant Orders   Lipid panel (Completed)   Vitamin D deficiency    Supplement and monitor      Essential hypertension    Well controlled, no changes to meds. Encouraged heart healthy diet such as the DASH diet and exercise as tolerated.       Relevant Orders   CBC (Completed)   Comprehensive metabolic panel (Completed)   TSH (Completed)   Hyperglycemia    hgba1c acceptable, minimize simple carbs. Increase exercise as tolerated.       Relevant Orders    Hemoglobin A1c (Completed)      Follow-up: No follow-ups on file.   I,David Hanna,acting as a scribe for Penni Homans, MD.,have documented all relevant documentation on the behalf of Penni Homans, MD,as directed by  Erline Levine  Charlett Blake, MD while in the presence of Penni Homans, MD.  I, Mosie Lukes, MD personally performed the services described in this documentation. All medical record entries made by the scribe were at my direction and in my presence. I have reviewed the chart and agree that the record reflects my personal performance and is accurate and complete

## 2020-12-27 NOTE — Assessment & Plan Note (Signed)
Well controlled, no changes to meds. Encouraged heart healthy diet such as the DASH diet and exercise as tolerated.  °

## 2020-12-27 NOTE — Assessment & Plan Note (Signed)
Supplement and monitor 

## 2020-12-28 DIAGNOSIS — I10 Essential (primary) hypertension: Secondary | ICD-10-CM | POA: Diagnosis not present

## 2020-12-28 DIAGNOSIS — Z961 Presence of intraocular lens: Secondary | ICD-10-CM | POA: Diagnosis not present

## 2020-12-28 DIAGNOSIS — H5213 Myopia, bilateral: Secondary | ICD-10-CM | POA: Diagnosis not present

## 2020-12-28 DIAGNOSIS — Z9849 Cataract extraction status, unspecified eye: Secondary | ICD-10-CM | POA: Diagnosis not present

## 2020-12-28 DIAGNOSIS — H35033 Hypertensive retinopathy, bilateral: Secondary | ICD-10-CM | POA: Diagnosis not present

## 2020-12-28 DIAGNOSIS — H524 Presbyopia: Secondary | ICD-10-CM | POA: Diagnosis not present

## 2020-12-28 DIAGNOSIS — H52223 Regular astigmatism, bilateral: Secondary | ICD-10-CM | POA: Diagnosis not present

## 2021-02-06 ENCOUNTER — Ambulatory Visit (INDEPENDENT_AMBULATORY_CARE_PROVIDER_SITE_OTHER): Payer: Medicare Other

## 2021-02-06 ENCOUNTER — Other Ambulatory Visit: Payer: Self-pay

## 2021-02-06 DIAGNOSIS — M810 Age-related osteoporosis without current pathological fracture: Secondary | ICD-10-CM | POA: Diagnosis not present

## 2021-02-06 MED ORDER — DENOSUMAB 60 MG/ML ~~LOC~~ SOSY
60.0000 mg | PREFILLED_SYRINGE | Freq: Once | SUBCUTANEOUS | Status: AC
  Administered 2021-02-06: 60 mg via SUBCUTANEOUS

## 2021-02-06 NOTE — Progress Notes (Signed)
Jennifer Wilkins is a 87 y.o. female presents to the office today for prolia injections, per physician's orders.  Original order: 05/2014 denosumab (med), 60mg /17mL (dose),  SQ (route) was administered left upper arm (location) today. Patient tolerated injection.  Patient next injection due: 08/10/2020, appt made No  [name]

## 2021-04-20 ENCOUNTER — Telehealth: Payer: Self-pay | Admitting: Family Medicine

## 2021-04-20 ENCOUNTER — Other Ambulatory Visit: Payer: Self-pay

## 2021-04-20 MED ORDER — CARVEDILOL 12.5 MG PO TABS: 12.5000 mg | ORAL_TABLET | Freq: Every day | ORAL | 1 refills | Status: DC

## 2021-04-20 MED ORDER — HYDROCHLOROTHIAZIDE 25 MG PO TABS: 25.0000 mg | ORAL_TABLET | Freq: Every day | ORAL | 1 refills | Status: DC

## 2021-04-20 NOTE — Telephone Encounter (Signed)
Medication sent in. 

## 2021-04-20 NOTE — Telephone Encounter (Signed)
Pt needs meds refill. hydrochlorothiazide (HYDRODIURIL) 25 MG tablet  carvedilol (COREG) 12.5 MG tablet   [facility] - [place], [place]  [address], [place] [address]  Phone:  [phone]  Fax:  [phone]

## 2021-05-21 DIAGNOSIS — Z23 Encounter for immunization: Secondary | ICD-10-CM | POA: Diagnosis not present

## 2021-05-28 ENCOUNTER — Other Ambulatory Visit: Payer: Self-pay

## 2021-05-28 ENCOUNTER — Ambulatory Visit (INDEPENDENT_AMBULATORY_CARE_PROVIDER_SITE_OTHER): Payer: Medicare Other

## 2021-05-28 VITALS — BP 138/70 | HR 88 | Temp 97.9°F | Resp 16 | Ht <= 58 in | Wt 112.2 lb

## 2021-05-28 DIAGNOSIS — Z0000 Encounter for general adult medical examination without abnormal findings: Secondary | ICD-10-CM | POA: Diagnosis not present

## 2021-05-28 NOTE — Progress Notes (Signed)
Subjective:   KATHYE CIPRIANI is a 87 y.o. female who presents for Medicare Annual (Subsequent) preventive examination.  Review of Systems     Cardiac Risk Factors include: advanced age (>39men, >28 women);hypertension;dyslipidemia     Objective:    Today's Vitals   05/28/21 1423 05/28/21 1426  BP: 138/70   Pulse: 88   Resp: 16   Temp: 97.9 F (36.6 C)   TempSrc: Temporal   SpO2: 98%   Weight: 112 lb 3.2 oz (50.9 kg)   Height: 4\' 8"  (1.422 m)   PainSc:  2    Body mass index is 25.15 kg/m.  Advanced Directives 05/28/2021 04/04/2020 04/01/2019 01/02/2018 12/30/2016 09/11/2016 09/03/2016  Does Patient Have a Medical Advance Directive? Yes Yes Yes Yes Yes Yes Yes  Type of Paramedic of [place];Living will Out of facility DNR (pink MOST or yellow form) Out of facility DNR (pink MOST or yellow form) [place];Living will [place];Living will [place];Living will [place];Living will  Does patient want to make changes to medical advance directive? - No - Patient declined - - - No - Patient declined -  Copy of [place] in Chart? No - copy requested - - Yes No - copy requested No - copy requested No - copy requested    Current Medications (verified) Outpatient Encounter Medications as of 05/28/2021  Medication Sig   aspirin 81 MG tablet Take 1 tablet (81 mg total) by mouth daily. Resume 4 days post-op   Calcium Carbonate-Vitamin D 600-400 MG-UNIT tablet Take 1 tablet by mouth 2 (two) times daily.   carvedilol (COREG) 12.5 MG tablet Take 1 tablet (12.5 mg total) by mouth daily.   Cyanocobalamin (VITAMIN B-12 CR) 1500 MCG TBCR Take by mouth daily.   Diclofenac Sodium 2 % SOLN Place onto the skin.   FIBER ADULT GUMMIES PO Take 2 each by mouth.   hydrochlorothiazide (HYDRODIURIL) 25 MG tablet Take 1 tablet (25 mg total) by mouth daily.   meclizine (ANTIVERT) 25 MG tablet Take  0.5-1 tablets (12.5-25 mg total) by mouth 3 (three) times daily as needed for dizziness.   Methyl Salicylate-Lido-Menthol (LIDOCAINE OINT & MEN-METH SAL EX) Apply topically.   Misc Natural Products (OSTEO BI-FLEX ADV JOINT SHIELD) TABS Take 1 tablet by mouth daily. Resume 5 days post-op   Multiple Vitamins-Minerals (HAIR/SKIN/NAILS/BIOTIN) TABS Take 1 tablet by mouth daily. Resume 5 days post-op   Multiple Vitamins-Minerals (MULTIVITAMIN WITH MINERALS) tablet Take 1 tablet by mouth daily. Resume 5 days post-op   Omega-3 Fatty Acids (FISH OIL) 1000 MG CAPS Take by mouth.   polyethylene glycol (MIRALAX / GLYCOLAX) packet Take 17 g by mouth daily.   Probiotic Product (PROBIOTIC DAILY PO) Take by mouth.   Turmeric 500 MG CAPS Take 1 capsule by mouth daily. Resume 5 days post-op as needed   vitamin E 400 UNIT capsule Take 1 capsule (400 Units total) by mouth daily. Resume 5 days post-op   No facility-administered encounter medications on file as of 05/28/2021.    Allergies (verified) Patient has no known allergies.   History: Past Medical History:  Diagnosis Date   Anemia 09/30/2016   Arthritis    Elevated BP 04/21/2014   HTN (hypertension)    Hypercholesteremia    Hypocalcemia 06/19/2013   Low back pain radiating to both legs 08/27/2015   Medicare annual wellness visit, subsequent 05/03/2015   Preventative health care 06/02/2016  Stress reaction 04/25/2015   Past Surgical History:  Procedure Laterality Date   CHOLECYSTECTOMY     GALLBLADDER SURGERY  1984   Gallstone removal   LUMBAR LAMINECTOMY/DECOMPRESSION MICRODISCECTOMY N/A 09/11/2016   Procedure: Microlumbar Decompression L3-4 and L4-5, lateral mass fusion at L4-5 using autologous, autograft bone;  Surgeon: Susa Day, MD;  Location: [facility];  Service: Orthopedics;  Laterality: N/A;   Family History  Problem Relation Age of Onset   Osteoporosis Mother    Ulcers Father    Kidney disease Paternal Grandfather    Social History    Socioeconomic History   Marital status: Widowed    Spouse name: Not on file   Number of children: Not on file   Years of education: Not on file   Highest education level: Not on file  Occupational History   Not on file  Tobacco Use   Smoking status: Never   Smokeless tobacco: Never  Substance and Sexual Activity   Alcohol use: No   Drug use: No   Sexual activity: Not Currently  Other Topics Concern   Not on file  Social History Narrative   Widowed July 2017   Social Determinants of Health   Financial Resource Strain: Low Risk    Difficulty of Paying Living Expenses: Not hard at all  Food Insecurity: No Food Insecurity   Worried About Charity fundraiser in the Last Year: Never true   Arboriculturist in the Last Year: Never true  Transportation Needs: No Transportation Needs   Lack of Transportation (Medical): No   Lack of Transportation (Non-Medical): No  Physical Activity: Sufficiently Active   Days of Exercise per Week: 7 days   Minutes of Exercise per Session: 60 min  Stress: No Stress Concern Present   Feeling of Stress : Not at all  Social Connections: Moderately Isolated   Frequency of Communication with Friends and Family: More than three times a week   Frequency of Social Gatherings with Friends and Family: More than three times a week   Attends Religious Services: More than 4 times per year   Active Member of [employer] or Organizations: No   Attends Archivist Meetings: Never   Marital Status: Widowed    Tobacco Counseling Counseling given: Not Answered   Clinical Intake:  Pre-visit preparation completed: Yes  Pain : 0-10 Pain Score: 2  Pain Type: Chronic pain Pain Location: Back Pain Onset: More than a month ago Pain Frequency: Constant Pain Relieving Factors: Tylenol  Pain Relieving Factors: Tylenol  Nutritional Status: BMI 25 -29 Overweight Nutritional Risks: None Diabetes: No  How often do you need to have someone help you when  you read instructions, pamphlets, or other written materials from your doctor or pharmacy?: 1 - Never  Diabetic?No  Interpreter Needed?: No  Information entered by :: Caroleen Hamman LPN   Activities of Daily Living In your present state of health, do you have any difficulty performing the following activities: 05/28/2021  Hearing? N  Vision? N  Difficulty concentrating or making decisions? N  Walking or climbing stairs? N  Dressing or bathing? N  Doing errands, shopping? N  Preparing Food and eating ? N  Using the Toilet? N  In the past six months, have you accidently leaked urine? N  Do you have problems with loss of bowel control? N  Managing your Medications? N  Managing your Finances? N  Housekeeping or managing your Housekeeping? N  Some recent data might be hidden  Patient Care Team: Mosie Lukes, MD as PCP - General (Family Medicine) Marica Otter, OD (Optometry)  Indicate any recent Medical Services you may have received from other than [facility] providers in the past year (date may be approximate).     Assessment:   This is a routine wellness examination for Tilla.  Hearing/Vision screen Hearing Screening - Comments:: Bilateral hearing aids Vision Screening - Comments:: Last eye exam-6 months ago-Dr. [name] Heck  Dietary issues and exercise activities discussed: Current Exercise Habits: Home exercise routine, Type of exercise: walking, Time (Minutes): 60, Frequency (Times/Week): 7, Weekly Exercise (Minutes/Week): 420, Intensity: Mild, Exercise limited by: None identified   Goals Addressed             This Visit's Progress    DIET - INCREASE WATER INTAKE   On track    Maintain current lifestyle.   On track      Depression Screen PHQ 2/9 Scores 05/28/2021 12/26/2020 04/06/2020 04/04/2020 04/01/2019 01/02/2018 12/30/2016  PHQ - 2 Score 0 0 0 0 0 0 0  PHQ- 9 Score - - 0 - - - -    Fall Risk Fall Risk  05/28/2021 12/26/2020 04/04/2020 04/01/2019 01/02/2018  Falls in the  past year? 0 0 0 0 No  Number falls in past yr: 0 - 0 - -  Injury with Fall? 0 - 0 - -  Follow up Falls prevention discussed - Education provided;Falls prevention discussed - -    FALL RISK PREVENTION PERTAINING TO THE HOME:  Any stairs in or around the home? No  Home free of loose throw rugs in walkways, pet beds, electrical cords, etc? Yes  Adequate lighting in your home to reduce risk of falls? Yes   ASSISTIVE DEVICES UTILIZED TO PREVENT FALLS:  Life alert? No  Use of a cane, walker or w/c? Yes  Grab bars in the bathroom? No  Shower chair or bench in shower? Yes  Elevated toilet seat or a handicapped toilet? No   TIMED UP AND GO:  Was the test performed? Yes .  Length of time to ambulate 10 feet: 12 sec.   Gait steady and fast with assistive device  Cognitive Function:Normal cognitive status assessed by direct observation by this Nurse Health Advisor. No abnormalities found.   MMSE - Mini Mental State Exam 01/02/2018 12/30/2016  Orientation to time 5 5  Orientation to Place 5 5  Registration 3 3  Attention/ Calculation 5 5  Recall 3 3  Language- name 2 objects 2 2  Language- repeat 1 1  Language- follow 3 step command 3 3  Language- read & follow direction 1 1  Write a sentence 1 1  Copy design 1 1  Total score 30 30     6CIT Screen 04/01/2019  What time? 0 points  Count back from 20 0 points  Months in reverse 0 points  Repeat phrase 0 points    Immunizations Immunization History  Administered Date(s) Administered   Fluad Quad(high Dose 65+) 05/11/2019   Influenza Split 04/24/2012   Influenza Whole 06/11/2002, 06/17/2003   Influenza, High Dose Seasonal PF 04/25/2015, 05/02/2017, 05/01/2020, 05/21/2021   Influenza,inj,Quad PF,6+ Mos 04/21/2014, 05/02/2017   Influenza,inj,quad, With Preservative 03/29/2017   Influenza-Unspecified 04/24/2012, 04/28/2013, 07/29/2018   Moderna Covid-19 Vaccine Bivalent Booster 1yrs & up 04/23/2021   Moderna Sars-Covid-2  Vaccination 08/10/2019, 09/07/2019, 05/31/2020, 11/15/2020   Pneumococcal Conjugate-13 06/14/2013, 10/14/2014   Pneumococcal Polysaccharide-23 05/30/2016   Tdap 06/14/2013   Zoster Recombinat (Shingrix) 11/12/2017, 03/24/2018  Zoster, Live 07/29/2014    TDAP status: Up to date  Flu Vaccine status: Up to date  Pneumococcal vaccine status: Up to date  Covid-19 vaccine status: Completed vaccines  Qualifies for Shingles Vaccine? No   Zostavax completed Yes   Shingrix Completed?: Yes  Screening Tests Health Maintenance  Topic Date Due   TETANUS/TDAP  06/15/2023   Pneumonia Vaccine 40+ Years old  Completed   INFLUENZA VACCINE  Completed   DEXA SCAN  Completed   COVID-19 Vaccine  Completed   Zoster Vaccines- Shingrix  Completed   HPV VACCINES  Aged Out    Health Maintenance  There are no preventive care reminders to display for this patient.   Colorectal cancer screening: No longer required.   Mammogram status: No longer required due to patient declines.  Bone Density status: Completed 09/17/2019. Results reflect: Bone density results: OSTEOPOROSIS. Repeat every 2 years.  Lung Cancer Screening: (Low Dose CT Chest recommended if Age 66-80 years, 30 pack-year currently smoking OR have quit w/in 15years.) does not qualify.     Additional Screening:  Hepatitis C Screening: does not qualify  Vision Screening: Recommended annual ophthalmology exams for early detection of glaucoma and other disorders of the eye. Is the patient up to date with their annual eye exam?  Yes  Who is the provider or what is the name of the office in which the patient attends annual eye exams? Dr. [name] Heck    Dental Screening: Recommended annual dental exams for proper oral hygiene  Community Resource Referral / Chronic Care Management: CRR required this visit?  No   CCM required this visit?  No      Plan:     I have personally reviewed and noted the following in the patient's chart:    Medical and social history Use of alcohol, tobacco or illicit drugs  Current medications and supplements including opioid prescriptions.  Functional ability and status Nutritional status Physical activity Advanced directives List of other physicians Hospitalizations, surgeries, and ER visits in previous 12 months Vitals Screenings to include cognitive, depression, and falls Referrals and appointments  In addition, I have reviewed and discussed with patient certain preventive protocols, quality metrics, and best practice recommendations. A written personalized care plan for preventive services as well as general preventive health recommendations were provided to patient.     Marta Antu, LPN   79/39/0300  Nurse Health Advisor  Nurse Notes: None

## 2021-05-28 NOTE — Patient Instructions (Addendum)
[name] , Thank you for taking time to come for your Medicare Wellness Visit. I appreciate your ongoing commitment to your health goals. Please review the following plan we discussed and let me know if I can assist you in the future.   Screening recommendations/referrals: Colonoscopy: No longer required Mammogram: Declined today Bone Density: Completed 08/2019-Due 08/2021 Recommended yearly ophthalmology/optometry visit for glaucoma screening and checkup Recommended yearly dental visit for hygiene and checkup  Vaccinations: Influenza vaccine: Up to date Pneumococcal vaccine: Up to date Tdap vaccine: Up to date-Due-06/15/2023 Shingles vaccine: Completed vaccines   Covid-19:Up to date  Advanced directives: Please bring a copy of Living Will and/or Healthcare Power of Attorney for your chart.   Conditions/risks identified: See problem list  Next appointment: Follow up in one year for your annual wellness visit  06/03/2022 @ 2:20.   Preventive Care 65 Years and Older, Female Preventive care refers to lifestyle choices and visits with your health care provider that can promote health and wellness. What does preventive care include? A yearly physical exam. This is also called an annual well check. Dental exams once or twice a year. Routine eye exams. Ask your health care provider how often you should have your eyes checked. Personal lifestyle choices, including: Daily care of your teeth and gums. Regular physical activity. Eating a healthy diet. Avoiding tobacco and drug use. Limiting alcohol use. Practicing safe sex. Taking low-dose aspirin every day. Taking vitamin and mineral supplements as recommended by your health care provider. What happens during an annual well check? The services and screenings done by your health care provider during your annual well check will depend on your age, overall health, lifestyle risk factors, and family history of disease. Counseling  Your  health care provider may ask you questions about your: Alcohol use. Tobacco use. Drug use. Emotional well-being. Home and relationship well-being. Sexual activity. Eating habits. History of falls. Memory and ability to understand (cognition). Work and work Statistician. Reproductive health. Screening  You may have the following tests or measurements: Height, weight, and BMI. Blood pressure. Lipid and cholesterol levels. These may be checked every 5 years, or more frequently if you are over 43 years old. Skin check. Lung cancer screening. You may have this screening every year starting at age 46 if you have a 30-pack-year history of smoking and currently smoke or have quit within the past 15 years. Fecal occult blood test (FOBT) of the stool. You may have this test every year starting at age 50. Flexible sigmoidoscopy or colonoscopy. You may have a sigmoidoscopy every 5 years or a colonoscopy every 10 years starting at age 27. Hepatitis C blood test. Hepatitis B blood test. Sexually transmitted disease (STD) testing. Diabetes screening. This is done by checking your blood sugar (glucose) after you have not eaten for a while (fasting). You may have this done every 1-3 years. Bone density scan. This is done to screen for osteoporosis. You may have this done starting at age 61. Mammogram. This may be done every 1-2 years. Talk to your health care provider about how often you should have regular mammograms. Talk with your health care provider about your test results, treatment options, and if necessary, the need for more tests. Vaccines  Your health care provider may recommend certain vaccines, such as: Influenza vaccine. This is recommended every year. Tetanus, diphtheria, and acellular pertussis (Tdap, Td) vaccine. You may need a Td booster every 10 years. Zoster vaccine. You may need this after age 77. Pneumococcal  13-valent conjugate (PCV13) vaccine. One dose is recommended after age  60. Pneumococcal polysaccharide (PPSV23) vaccine. One dose is recommended after age 38. Talk to your health care provider about which screenings and vaccines you need and how often you need them. This information is not intended to replace advice given to you by your health care provider. Make sure you discuss any questions you have with your health care provider. Document Released: 08/11/2015 Document Revised: 04/03/2016 Document Reviewed: 05/16/2015 Elsevier Interactive Patient Education  2017 [employer] Prevention in the Home Falls can cause injuries. They can happen to people of all ages. There are many things you can do to make your home safe and to help prevent falls. What can I do on the outside of my home? Regularly fix the edges of walkways and driveways and fix any cracks. Remove anything that might make you trip as you walk through a door, such as a raised step or threshold. Trim any bushes or trees on the path to your home. Use bright outdoor lighting. Clear any walking paths of anything that might make someone trip, such as rocks or tools. Regularly check to see if handrails are loose or broken. Make sure that both sides of any steps have handrails. Any raised decks and porches should have guardrails on the edges. Have any leaves, snow, or ice cleared regularly. Use sand or salt on walking paths during winter. Clean up any spills in your garage right away. This includes oil or grease spills. What can I do in the bathroom? Use night lights. Install grab bars by the toilet and in the tub and shower. Do not use towel bars as grab bars. Use non-skid mats or decals in the tub or shower. If you need to sit down in the shower, use a plastic, non-slip stool. Keep the floor dry. Clean up any water that spills on the floor as soon as it happens. Remove soap buildup in the tub or shower regularly. Attach bath mats securely with double-sided non-slip rug tape. Do not have throw  rugs and other things on the floor that can make you trip. What can I do in the bedroom? Use night lights. Make sure that you have a light by your bed that is easy to reach. Do not use any sheets or blankets that are too big for your bed. They should not hang down onto the floor. Have a firm chair that has side arms. You can use this for support while you get dressed. Do not have throw rugs and other things on the floor that can make you trip. What can I do in the kitchen? Clean up any spills right away. Avoid walking on wet floors. Keep items that you use a lot in easy-to-reach places. If you need to reach something above you, use a strong step stool that has a grab bar. Keep electrical cords out of the way. Do not use floor polish or wax that makes floors slippery. If you must use wax, use non-skid floor wax. Do not have throw rugs and other things on the floor that can make you trip. What can I do with my stairs? Do not leave any items on the stairs. Make sure that there are handrails on both sides of the stairs and use them. Fix handrails that are broken or loose. Make sure that handrails are as long as the stairways. Check any carpeting to make sure that it is firmly attached to the stairs. Fix any carpet  that is loose or worn. Avoid having throw rugs at the top or bottom of the stairs. If you do have throw rugs, attach them to the floor with carpet tape. Make sure that you have a light switch at the top of the stairs and the bottom of the stairs. If you do not have them, ask someone to add them for you. What else can I do to help prevent falls? Wear shoes that: Do not have high heels. Have rubber bottoms. Are comfortable and fit you well. Are closed at the toe. Do not wear sandals. If you use a stepladder: Make sure that it is fully opened. Do not climb a closed stepladder. Make sure that both sides of the stepladder are locked into place. Ask someone to hold it for you, if  possible. Clearly mark and make sure that you can see: Any grab bars or handrails. First and last steps. Where the edge of each step is. Use tools that help you move around (mobility aids) if they are needed. These include: Canes. Walkers. Scooters. Crutches. Turn on the lights when you go into a dark area. Replace any light bulbs as soon as they burn out. Set up your furniture so you have a clear path. Avoid moving your furniture around. If any of your floors are uneven, fix them. If there are any pets around you, be aware of where they are. Review your medicines with your doctor. Some medicines can make you feel dizzy. This can increase your chance of falling. Ask your doctor what other things that you can do to help prevent falls. This information is not intended to replace advice given to you by your health care provider. Make sure you discuss any questions you have with your health care provider. Document Released: 05/11/2009 Document Revised: 12/21/2015 Document Reviewed: 08/19/2014 Elsevier Interactive Patient Education  2017 [employer].

## 2021-07-31 ENCOUNTER — Telehealth: Payer: Self-pay

## 2021-07-31 NOTE — Telephone Encounter (Signed)
Prolia VOB initiated via [email or web address]  Last OV:  Next OV:  Last Prolia inj: 02/06/21 Next Prolia inj DUE: 08/10/21

## 2021-08-04 NOTE — Telephone Encounter (Signed)
Pt ready for scheduling on or after 08/10/21  Out-of-pocket cost due at time of visit: $0.00  Primary: Medicare Prolia co-insurance: 20% (approximately $276) Admin fee co-insurance: 20% (approximately $25)  Secondary: [facility] Prolia co-insurance:  secondary plan does not follow Medicare guidelines. The secondary plan will consider the Medicare Part B deductible and coinsurance. Admin fee co-insurance:  secondary plan does not follow Medicare guidelines. The secondary plan will consider the Medicare Part B deductible and coinsurance.  Deductible:  secondary plan does not follow Medicare guidelines. The secondary plan will consider the Medicare Part B deductible and coinsurance.  Prior Auth: not required PA# Valid:   ** This summary of benefits is an estimation of the patient's out-of-pocket cost. Exact cost may vary based on individual plan coverage.

## 2021-08-06 NOTE — Telephone Encounter (Signed)
Pt ready for scheduling on or after 08/10/21   Out-of-pocket cost due at time of visit: $0.00

## 2021-08-07 NOTE — Telephone Encounter (Signed)
Lvm to call to schedule prolia injection

## 2021-08-15 ENCOUNTER — Ambulatory Visit (INDEPENDENT_AMBULATORY_CARE_PROVIDER_SITE_OTHER): Payer: Medicare Other | Admitting: *Deleted

## 2021-08-15 ENCOUNTER — Other Ambulatory Visit: Payer: Self-pay | Admitting: *Deleted

## 2021-08-15 DIAGNOSIS — M810 Age-related osteoporosis without current pathological fracture: Secondary | ICD-10-CM | POA: Diagnosis not present

## 2021-08-15 MED ORDER — DENOSUMAB 60 MG/ML ~~LOC~~ SOSY
60.0000 mg | PREFILLED_SYRINGE | Freq: Once | SUBCUTANEOUS | Status: AC
  Administered 2021-08-15: 60 mg via SUBCUTANEOUS

## 2021-08-15 NOTE — Progress Notes (Signed)
Patient here for Prolia injection per Dr. Charlett Blake.  Injection given in left arm and patient tolerated well.

## 2021-08-21 ENCOUNTER — Telehealth (HOSPITAL_BASED_OUTPATIENT_CLINIC_OR_DEPARTMENT_OTHER): Payer: Self-pay

## 2021-09-15 ENCOUNTER — Telehealth (HOSPITAL_BASED_OUTPATIENT_CLINIC_OR_DEPARTMENT_OTHER): Payer: Self-pay

## 2021-09-18 NOTE — Telephone Encounter (Signed)
Last Prolia inj 08/15/21 Next Prolia inj due 02/13/22

## 2021-10-17 ENCOUNTER — Other Ambulatory Visit: Payer: Self-pay | Admitting: Family Medicine

## 2021-10-18 ENCOUNTER — Ambulatory Visit (HOSPITAL_BASED_OUTPATIENT_CLINIC_OR_DEPARTMENT_OTHER)
Admission: RE | Admit: 2021-10-18 | Discharge: 2021-10-18 | Disposition: A | Discharge status: Home / Self Care | Payer: Medicare Other | Source: Ambulatory Visit | Attending: Family Medicine | Admitting: Family Medicine

## 2021-10-18 ENCOUNTER — Other Ambulatory Visit: Payer: Self-pay

## 2021-10-18 DIAGNOSIS — M810 Age-related osteoporosis without current pathological fracture: Secondary | ICD-10-CM | POA: Insufficient documentation

## 2021-10-18 DIAGNOSIS — Z780 Asymptomatic menopausal state: Secondary | ICD-10-CM | POA: Diagnosis not present

## 2021-10-18 DIAGNOSIS — M8588 Other specified disorders of bone density and structure, other site: Secondary | ICD-10-CM | POA: Diagnosis not present

## 2021-12-13 ENCOUNTER — Ambulatory Visit: Payer: Medicare Other | Admitting: Family Medicine

## 2021-12-17 ENCOUNTER — Ambulatory Visit: Payer: Medicare Other | Admitting: Family Medicine

## 2021-12-17 ENCOUNTER — Encounter: Payer: Self-pay | Admitting: Family Medicine

## 2021-12-17 ENCOUNTER — Ambulatory Visit (INDEPENDENT_AMBULATORY_CARE_PROVIDER_SITE_OTHER): Payer: Medicare Other | Admitting: Family Medicine

## 2021-12-17 VITALS — BP 121/78 | HR 87 | Resp 20 | Ht <= 58 in | Wt 110.0 lb

## 2021-12-17 DIAGNOSIS — E782 Mixed hyperlipidemia: Secondary | ICD-10-CM | POA: Diagnosis not present

## 2021-12-17 DIAGNOSIS — E538 Deficiency of other specified B group vitamins: Secondary | ICD-10-CM | POA: Diagnosis not present

## 2021-12-17 DIAGNOSIS — R739 Hyperglycemia, unspecified: Secondary | ICD-10-CM

## 2021-12-17 DIAGNOSIS — M810 Age-related osteoporosis without current pathological fracture: Secondary | ICD-10-CM | POA: Diagnosis not present

## 2021-12-17 DIAGNOSIS — I10 Essential (primary) hypertension: Secondary | ICD-10-CM

## 2021-12-17 DIAGNOSIS — R748 Abnormal levels of other serum enzymes: Secondary | ICD-10-CM

## 2021-12-17 DIAGNOSIS — E559 Vitamin D deficiency, unspecified: Secondary | ICD-10-CM

## 2021-12-17 LAB — CBC
HCT: 40.8 % (ref 36.0–46.0)
Hemoglobin: 13.6 g/dL (ref 12.0–15.0)
MCHC: 33.3 g/dL (ref 30.0–36.0)
MCV: 90 fl (ref 78.0–100.0)
Platelets: 184 10*3/uL (ref 150.0–400.0)
RBC: 4.54 Mil/uL (ref 3.87–5.11)
RDW: 14.6 % (ref 11.5–15.5)
WBC: 5 10*3/uL (ref 4.0–10.5)

## 2021-12-17 LAB — LIPID PANEL
Cholesterol: 223 mg/dL — ABNORMAL HIGH (ref 0–200)
HDL: 85.7 mg/dL (ref >39.00)
LDL Cholesterol: 112 mg/dL — ABNORMAL HIGH (ref 0–99)
NonHDL: 137.5
Total CHOL/HDL Ratio: 3
Triglycerides: 129 mg/dL (ref 0.0–149.0)
VLDL: 25.8 mg/dL (ref 0.0–40.0)

## 2021-12-17 LAB — COMPREHENSIVE METABOLIC PANEL
ALT: 16 U/L (ref 0–35)
AST: 19 U/L (ref 0–37)
Albumin: 4.1 g/dL (ref 3.5–5.2)
Alkaline Phosphatase: 60 U/L (ref 39–117)
BUN: 22 mg/dL (ref 6–23)
CO2: 30 mEq/L (ref 19–32)
Calcium: 9.3 mg/dL (ref 8.4–10.5)
Chloride: 100 mEq/L (ref 96–112)
Creatinine, Ser: 0.86 mg/dL (ref 0.40–1.20)
GFR: 60.33 mL/min (ref >60.00)
Glucose, Bld: 96 mg/dL (ref 70–99)
Potassium: 3.9 mEq/L (ref 3.5–5.1)
Sodium: 139 mEq/L (ref 135–145)
Total Bilirubin: 0.8 mg/dL (ref 0.2–1.2)
Total Protein: 6.6 g/dL (ref 6.0–8.3)

## 2021-12-17 LAB — TSH: TSH: 2.2 u[IU]/mL (ref 0.35–5.50)

## 2021-12-17 LAB — HEMOGLOBIN A1C: Hgb A1c MFr Bld: 5.8 % (ref 4.6–6.5)

## 2021-12-17 LAB — VITAMIN B12: Vitamin B-12: >1504 pg/mL — ABNORMAL HIGH (ref 211–911)

## 2021-12-17 LAB — VITAMIN D 25 HYDROXY (VIT D DEFICIENCY, FRACTURES): VITD: 87.29 ng/mL (ref 30.00–100.00)

## 2021-12-17 NOTE — Assessment & Plan Note (Signed)
Previous A1c stable. Labs today. Continue healthy, low-carb diet

## 2021-12-17 NOTE — Progress Notes (Signed)
Established Patient Office Visit  Subjective   Patient ID: Jennifer Wilkins, female    DOB: 1931/12/16  Age: 88 y.o. MRN: [id number]  Chief Complaint  Patient presents with   Follow-up    HPI Patient presents for routine follow-up with family member. Reports she is feeling great overall. No new concerns.     HYPERLIPIDEMIA - medications: Omega-3 - compliance: good - medication SEs: no The ASCVD Risk score (Arnett DK, et al., 2019) failed to calculate for the following reasons:   The 2019 ASCVD risk score is only valid for ages 41 to 66   HYPERTENSION: - Medications: coreg 12.5 mg daily  - Compliance: good - Checking BP at home: rarely - Denies any SOB, recurrent headaches, CP, vision changes, LE edema, dizziness, palpitations, or medication side effects. - Diet: general - Exercise: walks 10k steps daily     OSTEOPOROSIS/VITAMIN D DEFICIENCY: - Calcium and Vitamin D supplements - very active, walks daily    B12 DEFICIENCY: - supplements with cyanocobalamin 1500 mcg daily  - asymptomatic     ROS All review of systems negative except what is listed in the HPI    Objective:     BP 121/78   Pulse 87   Resp 20   Ht '4\' 8"'$  (1.422 m)   Wt 110 lb (49.9 kg)   SpO2 100%   BMI 24.66 kg/m    Physical Exam Vitals reviewed.  Constitutional:      Appearance: Normal appearance. She is normal weight.  HENT:     Head: Normocephalic and atraumatic.  Cardiovascular:     Rate and Rhythm: Normal rate and regular rhythm.  Pulmonary:     Effort: Pulmonary effort is normal.     Breath sounds: Normal breath sounds.  Musculoskeletal:     Cervical back: Normal range of motion and neck supple. No tenderness.  Lymphadenopathy:     Cervical: No cervical adenopathy.  Skin:    General: Skin is warm and dry.  Neurological:     General: No focal deficit present.     Mental Status: She is alert and oriented to person, place, and time. Mental status is at baseline.   Psychiatric:        Mood and Affect: Mood normal.        Behavior: Behavior normal.        Thought Content: Thought content normal.        Judgment: Judgment normal.     No results found for any visits on 12/17/21.    The ASCVD Risk score (Arnett DK, et al., 2019) failed to calculate for the following reasons:   The 2019 ASCVD risk score is only valid for ages 63 to 87    Assessment & Plan:   Problem List Items Addressed This Visit       Cardiovascular and Mediastinum   Essential hypertension - Primary    Blood pressure is at goal for age and co-morbidities.  I recommend continuing coreg 12.5 mg daily .   - BP goal <130/80 - monitor and log blood pressures at home - check around the same time each day in a relaxed setting - Limit salt to <2000 mg/day - Follow DASH eating plan (heart healthy diet) - limit alcohol to 2 standard drinks per day for men and 1 per day for women - avoid tobacco products - get at least 2 hours of regular aerobic exercise weekly Patient aware of signs/symptoms requiring further/urgent evaluation. Labs updated today.  Relevant Orders   CBC   TSH     Musculoskeletal and Integument   Osteoporosis    Continue calcium, vitamin d supplementation. No recent falls.        Relevant Orders   VITAMIN D 25 Hydroxy (Vit-D Deficiency, Fractures)     Other   Hyperlipidemia, mixed    HLD PLAN: -Reviewed most recent lipid panel -Medication management: continue Omega 3  -Repeat CMP and lipid panel today -Diet low in saturated fat -Regular exercise - at least 30 minutes, 5 times per week       Relevant Orders   Comprehensive metabolic panel   Lipid panel   Vitamin D deficiency    Stable. Continue supplementation. Labs today        Relevant Orders   VITAMIN D 25 Hydroxy (Vit-D Deficiency, Fractures)   Hyperglycemia    Previous A1c stable. Labs today. Continue healthy, low-carb diet       Relevant Orders   Hemoglobin A1c    Comprehensive metabolic panel   Other Visit Diagnoses     B12 deficiency       Relevant Orders   Vitamin B12       Return in about 6 months (around 06/19/2022) for routine f/u 6-12 months .    Terrilyn Saver, NP

## 2021-12-17 NOTE — Assessment & Plan Note (Signed)
Continue calcium, vitamin d supplementation. No recent falls.

## 2021-12-17 NOTE — Assessment & Plan Note (Signed)
Stable. Continue supplementation. Labs today

## 2021-12-17 NOTE — Addendum Note (Signed)
Addended by: Caleen Jobs B on: 12/17/2021 10:17 PM   Modules accepted: Orders

## 2021-12-17 NOTE — Assessment & Plan Note (Signed)
Blood pressure is at goal for age and co-morbidities.  I recommend continuing coreg 12.5 mg daily .   - BP goal <130/80 - monitor and log blood pressures at home - check around the same time each day in a relaxed setting - Limit salt to <2000 mg/day - Follow DASH eating plan (heart healthy diet) - limit alcohol to 2 standard drinks per day for men and 1 per day for women - avoid tobacco products - get at least 2 hours of regular aerobic exercise weekly Patient aware of signs/symptoms requiring further/urgent evaluation. Labs updated today.

## 2021-12-17 NOTE — Assessment & Plan Note (Signed)
HLD PLAN: -Reviewed most recent lipid panel -Medication management: continue Omega 3  -Repeat CMP and lipid panel today -Diet low in saturated fat -Regular exercise - at least 30 minutes, 5 times per week

## 2021-12-29 NOTE — Telephone Encounter (Signed)
Prolia VOB initiated via [email or web address]  Last OV:  Next OV:  Last Prolia inj 08/15/21 Next Prolia inj due 02/13/22

## 2022-02-01 ENCOUNTER — Other Ambulatory Visit (INDEPENDENT_AMBULATORY_CARE_PROVIDER_SITE_OTHER): Payer: Medicare Other

## 2022-02-01 ENCOUNTER — Telehealth: Payer: Self-pay | Admitting: Family Medicine

## 2022-02-01 DIAGNOSIS — R748 Abnormal levels of other serum enzymes: Secondary | ICD-10-CM | POA: Diagnosis not present

## 2022-02-01 LAB — VITAMIN B12: Vitamin B-12: 1327 pg/mL — ABNORMAL HIGH (ref 211–911)

## 2022-02-01 NOTE — Telephone Encounter (Signed)
Pt due 02/12/22.

## 2022-02-01 NOTE — Telephone Encounter (Signed)
Pt came in office stating in July pt is due for prolia shot (pt was not sure but want to verify if due for prolia), pt would like to know if she has the approval to schedule appt for July for her prolia. Please advise. Pt tel [phone]

## 2022-02-01 NOTE — Telephone Encounter (Signed)
Please see Below:  Pt came in office stating in July pt is due for prolia shot (pt was not sure but want to verify if due for prolia), pt would like to know if she has the approval to schedule appt for July for her prolia. Please advise. Pt tel [phone]

## 2022-02-03 NOTE — Telephone Encounter (Signed)
Pt ready for scheduling on or after 02/13/22  Out-of-pocket cost due at time of visit: $0  Primary: Medicare Prolia co-insurance: 20% (approximately $276) Admin fee co-insurance: 20% (approximately $25)  Secondary:  [employer] Prolia co-insurance: Covers Medicare Part B co-insurance Admin fee co-insurance: Covers Medicare Part B co-insurance  Deductible:  Covered by secondary  Prior Auth: not required PA# Valid:   ** This summary of benefits is an estimation of the patient's out-of-pocket cost. Exact cost may vary based on individual plan coverage.

## 2022-02-04 NOTE — Telephone Encounter (Signed)
Prolia scheduled.

## 2022-02-13 NOTE — Progress Notes (Signed)
Patient here for Prolia injection per pcp orders.   Injection given in left arm and patient tolerated well.

## 2022-02-14 ENCOUNTER — Ambulatory Visit (INDEPENDENT_AMBULATORY_CARE_PROVIDER_SITE_OTHER): Payer: Medicare Other | Admitting: *Deleted

## 2022-02-14 DIAGNOSIS — M810 Age-related osteoporosis without current pathological fracture: Secondary | ICD-10-CM

## 2022-02-14 MED ORDER — DENOSUMAB 60 MG/ML ~~LOC~~ SOSY
60.0000 mg | PREFILLED_SYRINGE | Freq: Once | SUBCUTANEOUS | Status: AC
  Administered 2022-02-14: 60 mg via SUBCUTANEOUS

## 2022-03-08 NOTE — Telephone Encounter (Signed)
Last Prolia inj 02/14/22 Next prolia inj due 08/18/22

## 2022-05-24 ENCOUNTER — Telehealth: Payer: Self-pay | Admitting: Family Medicine

## 2022-05-24 ENCOUNTER — Other Ambulatory Visit: Payer: Self-pay

## 2022-05-24 MED ORDER — CARVEDILOL 12.5 MG PO TABS: 12.5000 mg | ORAL_TABLET | Freq: Every day | ORAL | 1 refills | Status: DC

## 2022-05-24 NOTE — Telephone Encounter (Signed)
Pt is requesting two refills. Only saw one of them on current med list, please advise.   Medication: hydrochlorothiazide '25mg'$   carvedilol (COREG) 12.5 MG tablet  Has the patient contacted their pharmacy? No.   Preferred Pharmacy:  [facility], [facility] [address], [place] [address] Phone: [phone]  Fax: [phone]

## 2022-05-25 ENCOUNTER — Other Ambulatory Visit: Payer: Self-pay | Admitting: Family Medicine

## 2022-05-25 MED ORDER — CARVEDILOL 12.5 MG PO TABS: 12.5000 mg | ORAL_TABLET | Freq: Every day | ORAL | 1 refills | Status: DC

## 2022-05-27 DIAGNOSIS — Z23 Encounter for immunization: Secondary | ICD-10-CM | POA: Diagnosis not present

## 2022-05-27 NOTE — Telephone Encounter (Signed)
Called pt Lvm of advise and to call our office back  We sent in the Carvedilol 12.'5mg'$  but we dont have the Hctz on her med list

## 2022-06-03 ENCOUNTER — Other Ambulatory Visit (HOSPITAL_BASED_OUTPATIENT_CLINIC_OR_DEPARTMENT_OTHER): Payer: Self-pay

## 2022-06-03 ENCOUNTER — Telehealth: Payer: Self-pay

## 2022-06-03 ENCOUNTER — Ambulatory Visit (INDEPENDENT_AMBULATORY_CARE_PROVIDER_SITE_OTHER): Payer: Medicare Other | Admitting: *Deleted

## 2022-06-03 VITALS — BP 131/76 | HR 103 | Ht <= 58 in | Wt 104.0 lb

## 2022-06-03 DIAGNOSIS — Z0000 Encounter for general adult medical examination without abnormal findings: Secondary | ICD-10-CM | POA: Diagnosis not present

## 2022-06-03 MED ORDER — HYDROCHLOROTHIAZIDE 25 MG PO TABS
25.0000 mg | ORAL_TABLET | Freq: Every day | ORAL | 0 refills | Status: DC
  Filled 2022-06-03: qty 90, 90d supply, fill #0

## 2022-06-03 NOTE — Patient Instructions (Signed)
Jennifer Wilkins , Thank you for taking time to come for your Medicare Wellness Visit. I appreciate your ongoing commitment to your health goals. Please review the following plan we discussed and let me know if I can assist you in the future.   These are the goals we discussed:  Goals      DIET - INCREASE WATER INTAKE     Maintain current lifestyle.        This is a list of the screening recommended for you and due dates:  Health Maintenance  Topic Date Due   COVID-19 Vaccine (6 - Moderna risk series) 06/18/2021   Medicare Annual Wellness Visit  06/04/2023   Tetanus Vaccine  06/15/2023   Pneumonia Vaccine  Completed   Flu Shot  Completed   DEXA scan (bone density measurement)  Completed   Zoster (Shingles) Vaccine  Completed   HPV Vaccine  Aged Out     Next appointment: Follow up in one year for your annual wellness visit.   Preventive Care 68 Years and Older, Female Preventive care refers to lifestyle choices and visits with your health care provider that can promote health and wellness. What does preventive care include? A yearly physical exam. This is also called an annual well check. Dental exams once or twice a year. Routine eye exams. Ask your health care provider how often you should have your eyes checked. Personal lifestyle choices, including: Daily care of your teeth and gums. Regular physical activity. Eating a healthy diet. Avoiding tobacco and drug use. Limiting alcohol use. Practicing safe sex. Taking low-dose aspirin every day. Taking vitamin and mineral supplements as recommended by your health care provider. What happens during an annual well check? The services and screenings done by your health care provider during your annual well check will depend on your age, overall health, lifestyle risk factors, and family history of disease. Counseling  Your health care provider may ask you questions about your: Alcohol use. Tobacco use. Drug use. Emotional  well-being. Home and relationship well-being. Sexual activity. Eating habits. History of falls. Memory and ability to understand (cognition). Work and work Statistician. Reproductive health. Screening  You may have the following tests or measurements: Height, weight, and BMI. Blood pressure. Lipid and cholesterol levels. These may be checked every 5 years, or more frequently if you are over 29 years old. Skin check. Lung cancer screening. You may have this screening every year starting at age 45 if you have a 30-pack-year history of smoking and currently smoke or have quit within the past 15 years. Fecal occult blood test (FOBT) of the stool. You may have this test every year starting at age 30. Flexible sigmoidoscopy or colonoscopy. You may have a sigmoidoscopy every 5 years or a colonoscopy every 10 years starting at age 27. Hepatitis C blood test. Hepatitis B blood test. Sexually transmitted disease (STD) testing. Diabetes screening. This is done by checking your blood sugar (glucose) after you have not eaten for a while (fasting). You may have this done every 1-3 years. Bone density scan. This is done to screen for osteoporosis. You may have this done starting at age 86. Mammogram. This may be done every 1-2 years. Talk to your health care provider about how often you should have regular mammograms. Talk with your health care provider about your test results, treatment options, and if necessary, the need for more tests. Vaccines  Your health care provider may recommend certain vaccines, such as: Influenza vaccine. This is recommended every  year. Tetanus, diphtheria, and acellular pertussis (Tdap, Td) vaccine. You may need a Td booster every 10 years. Zoster vaccine. You may need this after age 62. Pneumococcal 13-valent conjugate (PCV13) vaccine. One dose is recommended after age 42. Pneumococcal polysaccharide (PPSV23) vaccine. One dose is recommended after age 69. Talk to your  health care provider about which screenings and vaccines you need and how often you need them. This information is not intended to replace advice given to you by your health care provider. Make sure you discuss any questions you have with your health care provider. Document Released: 08/11/2015 Document Revised: 04/03/2016 Document Reviewed: 05/16/2015 Elsevier Interactive Patient Education  2017 [employer] Prevention in the Home Falls can cause injuries. They can happen to people of all ages. There are many things you can do to make your home safe and to help prevent falls. What can I do on the outside of my home? Regularly fix the edges of walkways and driveways and fix any cracks. Remove anything that might make you trip as you walk through a door, such as a raised step or threshold. Trim any bushes or trees on the path to your home. Use bright outdoor lighting. Clear any walking paths of anything that might make someone trip, such as rocks or tools. Regularly check to see if handrails are loose or broken. Make sure that both sides of any steps have handrails. Any raised decks and porches should have guardrails on the edges. Have any leaves, snow, or ice cleared regularly. Use sand or salt on walking paths during winter. Clean up any spills in your garage right away. This includes oil or grease spills. What can I do in the bathroom? Use night lights. Install grab bars by the toilet and in the tub and shower. Do not use towel bars as grab bars. Use non-skid mats or decals in the tub or shower. If you need to sit down in the shower, use a plastic, non-slip stool. Keep the floor dry. Clean up any water that spills on the floor as soon as it happens. Remove soap buildup in the tub or shower regularly. Attach bath mats securely with double-sided non-slip rug tape. Do not have throw rugs and other things on the floor that can make you trip. What can I do in the bedroom? Use night  lights. Make sure that you have a light by your bed that is easy to reach. Do not use any sheets or blankets that are too big for your bed. They should not hang down onto the floor. Have a firm chair that has side arms. You can use this for support while you get dressed. Do not have throw rugs and other things on the floor that can make you trip. What can I do in the kitchen? Clean up any spills right away. Avoid walking on wet floors. Keep items that you use a lot in easy-to-reach places. If you need to reach something above you, use a strong step stool that has a grab bar. Keep electrical cords out of the way. Do not use floor polish or wax that makes floors slippery. If you must use wax, use non-skid floor wax. Do not have throw rugs and other things on the floor that can make you trip. What can I do with my stairs? Do not leave any items on the stairs. Make sure that there are handrails on both sides of the stairs and use them. Fix handrails that are broken or  loose. Make sure that handrails are as long as the stairways. Check any carpeting to make sure that it is firmly attached to the stairs. Fix any carpet that is loose or worn. Avoid having throw rugs at the top or bottom of the stairs. If you do have throw rugs, attach them to the floor with carpet tape. Make sure that you have a light switch at the top of the stairs and the bottom of the stairs. If you do not have them, ask someone to add them for you. What else can I do to help prevent falls? Wear shoes that: Do not have high heels. Have rubber bottoms. Are comfortable and fit you well. Are closed at the toe. Do not wear sandals. If you use a stepladder: Make sure that it is fully opened. Do not climb a closed stepladder. Make sure that both sides of the stepladder are locked into place. Ask someone to hold it for you, if possible. Clearly mark and make sure that you can see: Any grab bars or handrails. First and last  steps. Where the edge of each step is. Use tools that help you move around (mobility aids) if they are needed. These include: Canes. Walkers. Scooters. Crutches. Turn on the lights when you go into a dark area. Replace any light bulbs as soon as they burn out. Set up your furniture so you have a clear path. Avoid moving your furniture around. If any of your floors are uneven, fix them. If there are any pets around you, be aware of where they are. Review your medicines with your doctor. Some medicines can make you feel dizzy. This can increase your chance of falling. Ask your doctor what other things that you can do to help prevent falls. This information is not intended to replace advice given to you by your health care provider. Make sure you discuss any questions you have with your health care provider. Document Released: 05/11/2009 Document Revised: 12/21/2015 Document Reviewed: 08/19/2014 Elsevier Interactive Patient Education  2017 [employer].

## 2022-06-03 NOTE — Telephone Encounter (Signed)
Caller Name [place] Phone Number [phone] Patient Name Jennifer Wilkins Patient DOB 10/15/1931 Call Type Message Only Information Provided Reason for Call Request for General Office Information Initial Comment Caller states she thinks she has a an appt of Monday but someone call her stating the time has move from 2pm to 1pm and she wants to make sure. Additional Comment Office hours provided Disp. Time Disposition Final User 06/01/2022 1:21:41 PM General Information Provided Yes Cathlean Sauer Call Closed By: Cathlean Sauer Transaction Date/Time: 06/01/2022 1:14:56 PM (ET)

## 2022-06-03 NOTE — Progress Notes (Signed)
Subjective:   Jennifer Wilkins is a 88 y.o. female who presents for Medicare Annual (Subsequent) preventive examination.  Review of Systems    Defer to PCP Cardiac Risk Factors include: advanced age (>69mn, >>19women);dyslipidemia;hypertension     Objective:    Today's Vitals   06/03/22 1418  BP: 131/76  Pulse: (!) 103  Weight: 104 lb (47.2 kg)  Height: '4\' 8"'$  (1.422 m)   Body mass index is 23.32 kg/m.     06/03/2022    2:23 PM 05/28/2021    2:38 PM 04/04/2020   10:57 AM 04/01/2019    2:36 PM 01/02/2018    2:17 PM 12/30/2016   11:44 AM 09/11/2016    3:22 PM  Advanced Directives  Does Patient Have a Medical Advance Directive? Yes Yes Yes Yes Yes Yes Yes  Type of AParamedicof [place]Living will [place] will Out of facility DNR (pink MOST or yellow form) Out of facility DNR (pink MOST or yellow form) [place]cre[place]Living will [place] HeightsLiving will [place]Living will  Does patient want to make changes to medical advance directive? No - Patient declined  No - Patient declined    No - Patient declined  Copy of HWarein Chart? No - copy requested No - copy requested   Yes No - copy requested No - copy requested    Current Medications (verified) Outpatient Encounter Medications as of 06/03/2022  Medication Sig   aspirin 81 MG tablet Take 1 tablet (81 mg total) by mouth daily. Resume 4 days post-op   Calcium Carbonate-Vitamin D 600-400 MG-UNIT tablet Take 1 tablet by mouth 2 (two) times daily.   carvedilol (COREG) 12.5 MG tablet Take 1 tablet (12.5 mg total) by mouth daily.   Cyanocobalamin (VITAMIN B-12 CR) 1500 MCG TBCR Take by mouth daily.   Ferrous Sulfate (IRON PO) Take by mouth daily.   FIBER ADULT GUMMIES PO Take 2 each by mouth.   hydrochlorothiazide (HYDRODIURIL) 25 MG tablet Take 1 tablet (25 mg total) by mouth daily.   Methyl Salicylate-Lido-Menthol  (LIDOCAINE OINT & MEN-METH SAL EX) Apply topically.   Misc Natural Products (OSTEO BI-FLEX ADV JOINT SHIELD) TABS Take 1 tablet by mouth daily. Resume 5 days post-op   Multiple Vitamins-Minerals (MULTIVITAMIN WITH MINERALS) tablet Take 1 tablet by mouth daily. Resume 5 days post-op   Omega-3 Fatty Acids (FISH OIL) 1000 MG CAPS Take by mouth.   Probiotic Product (PROBIOTIC DAILY PO) Take by mouth.   simvastatin (ZOCOR) 40 MG tablet Take 40 mg by mouth daily at 6 PM.   Turmeric 500 MG CAPS Take 1 capsule by mouth daily. Resume 5 days post-op as needed   vitamin E 400 UNIT capsule Take 1 capsule (400 Units total) by mouth daily. Resume 5 days post-op   [DISCONTINUED] hydrochlorothiazide (HYDRODIURIL) 25 MG tablet Take 25 mg by mouth daily.   No facility-administered encounter medications on file as of 06/03/2022.    Allergies (verified) Patient has no known allergies.   History: Past Medical History:  Diagnosis Date   Anemia 09/30/2016   Arthritis    Elevated BP 04/21/2014   HTN (hypertension)    Hypercholesteremia    Hypocalcemia 06/19/2013   Low back pain radiating to both legs 08/27/2015   Medicare annual wellness visit, subsequent 05/03/2015   Preventative health care 06/02/2016   Stress reaction 04/25/2015   Past Surgical History:  Procedure Laterality Date  CHOLECYSTECTOMY     GALLBLADDER SURGERY  1984   Gallstone removal   LUMBAR LAMINECTOMY/DECOMPRESSION MICRODISCECTOMY N/A 09/11/2016   Procedure: Microlumbar Decompression L3-4 and L4-5, lateral mass fusion at L4-5 using autologous, autograft bone;  Surgeon: Susa Day, MD;  Location: [facility];  Service: Orthopedics;  Laterality: N/A;   Family History  Problem Relation Age of Onset   Osteoporosis Mother    Ulcers Father    Kidney disease Paternal Grandfather    Social History   Socioeconomic History   Marital status: Widowed    Spouse name: Not on file   Number of children: Not on file   Years of education: Not on file    Highest education level: Not on file  Occupational History   Not on file  Tobacco Use   Smoking status: Never   Smokeless tobacco: Never  Substance and Sexual Activity   Alcohol use: No   Drug use: No   Sexual activity: Not Currently  Other Topics Concern   Not on file  Social History Narrative   Widowed July 2017   Social Determinants of Health   Financial Resource Strain: Low Risk  (05/28/2021)   Overall Financial Resource Strain (CARDIA)    Difficulty of Paying Living Expenses: Not hard at all  Food Insecurity: No Food Insecurity (05/28/2021)   Hunger Vital Sign    Worried About Running Out of Food in the Last Year: Never true    Ran Out of Food in the Last Year: Never true  Transportation Needs: No Transportation Needs (05/28/2021)   PRAPARE - Hydrologist (Medical): No    Lack of Transportation (Non-Medical): No  Physical Activity: Sufficiently Active (05/28/2021)   Exercise Vital Sign    Days of Exercise per Week: 7 days    Minutes of Exercise per Session: 60 min  Stress: No Stress Concern Present (05/28/2021)   [place]    Feeling of Stress : Not at all  Social Connections: Moderately Isolated (05/28/2021)   Social Connection and Isolation Panel [NHANES]    Frequency of Communication with Friends and Family: More than three times a week    Frequency of Social Gatherings with Friends and Family: More than three times a week    Attends Religious Services: More than 4 times per year    Active Member of [employer] or Organizations: No    Attends Archivist Meetings: Never    Marital Status: Widowed    Tobacco Counseling Counseling given: Not Answered   Clinical Intake:  Pre-visit preparation completed: Yes  Pain : No/denies pain  Diabetes: No  How often do you need to have someone help you when you read instructions, pamphlets, or other written materials  from your doctor or pharmacy?: 1 - Never  Activities of Daily Living    06/03/2022    2:24 PM  In your present state of health, do you have any difficulty performing the following activities:  Hearing? 1  Comment wears hearing aids  Vision? 0  Difficulty concentrating or making decisions? 0  Walking or climbing stairs? 0  Dressing or bathing? 0  Doing errands, shopping? [place] provides Copywriter, advertising and eating ? N  Using the Toilet? N  In the past six months, have you accidently leaked urine? N  Do you have problems with loss of bowel control? N  Managing your Medications? N  Managing your Finances? N  Housekeeping or managing your Housekeeping? [place] has houskeepers    Patient Care Team: Mosie Lukes, MD as PCP - General (Family Medicine) Marica Otter, OD (Optometry)  Indicate any recent Medical Services you may have received from other than [facility] providers in the past year (date may be approximate).     Assessment:   This is a routine wellness examination for [place].  Hearing/Vision screen No results found.  Dietary issues and exercise activities discussed: Current Exercise Habits: Home exercise routine, Type of exercise: walking, Time (Minutes): > 60, Frequency (Times/Week): 7, Weekly Exercise (Minutes/Week): 0, Intensity: Mild, Exercise limited by: None identified   Goals Addressed   None    Depression Screen    06/03/2022    2:23 PM 05/28/2021    2:44 PM 12/26/2020   11:20 AM 04/06/2020   11:16 AM 04/04/2020   11:06 AM 04/01/2019    2:37 PM 01/02/2018    2:17 PM  PHQ 2/9 Scores  PHQ - 2 Score 0 0 0 0 0 0 0  PHQ- 9 Score    0       Fall Risk    06/03/2022    2:23 PM 12/17/2021   10:08 AM 05/28/2021    2:42 PM 12/26/2020   11:20 AM 04/04/2020   11:06 AM  [place] in the past year? 0 0 0 0 0  Number falls in past yr: 0 0 0  0  Injury with Fall? 0 0 0  0  Risk for fall due to : No Fall Risks No  Fall Risks     Follow up Falls evaluation completed Falls evaluation completed Falls prevention discussed  Education provided;Falls prevention discussed    FALL RISK PREVENTION PERTAINING TO THE HOME:  Any stairs in or around the home? No  If so, are there any without handrails?  No stairs Home free of loose throw rugs in walkways, pet beds, electrical cords, etc? Yes  Adequate lighting in your home to reduce risk of falls? Yes   ASSISTIVE DEVICES UTILIZED TO PREVENT FALLS:  Life alert? No  Use of a cane, walker or w/c? Yes  Grab bars in the bathroom? Yes  Shower chair or bench in shower? Yes  Elevated toilet seat or a handicapped toilet? Yes   TIMED UP AND GO:  Was the test performed? Yes .  Length of time to ambulate 10 feet: 10 sec.   Gait slow and steady with assistive device  Cognitive Function:    01/02/2018    2:29 PM 12/30/2016   11:32 AM  MMSE - Mini Mental State Exam  Orientation to time 5 5  Orientation to Place 5 5  Registration 3 3  Attention/ Calculation 5 5  Recall 3 3  Language- name 2 objects 2 2  Language- repeat 1 1  Language- follow 3 step command 3 3  Language- read & follow direction 1 1  Write a sentence 1 1  Copy design 1 1  Total score 30 30        06/03/2022    2:37 PM 04/01/2019    2:45 PM  6CIT Screen  What Year? 0 points   What month? 0 points   What time? 0 points 0 points  Count back from 20 0 points 0 points  Months in reverse 0 points 0 points  Repeat phrase 0 points 0 points  Total Score 0 points     Immunizations Immunization History  Administered Date(s) Administered   Fluad Quad(high Dose 65+) 05/11/2019   Influenza Split 04/24/2012   Influenza Whole 06/11/2002, 06/17/2003   Influenza, High Dose Seasonal PF 04/25/2015, 05/02/2017, 05/01/2020, 05/21/2021, 05/20/2022   Influenza,inj,Quad PF,6+ Mos 04/21/2014, 05/02/2017   Influenza,inj,quad, With Preservative 03/29/2017   Influenza-Unspecified 04/24/2012, 04/28/2013,  07/29/2018   Moderna Covid-19 Vaccine Bivalent Booster 91yr & up 04/23/2021   Moderna Sars-Covid-2 Vaccination 08/10/2019, 09/07/2019, 05/31/2020, 11/15/2020   Pneumococcal Conjugate-13 06/14/2013, 10/14/2014   Pneumococcal Polysaccharide-23 05/30/2016   Tdap 06/14/2013   Zoster Recombinat (Shingrix) 11/12/2017, 03/24/2018   Zoster, Live 07/29/2014    TDAP status: Up to date  Flu Vaccine status: Up to date  Pneumococcal vaccine status: Up to date  Covid-19 vaccine status: Information provided on how to obtain vaccines.   Qualifies for Shingles Vaccine? Yes   Zostavax completed Yes   Shingrix Completed?: Yes  Screening Tests Health Maintenance  Topic Date Due   COVID-19 Vaccine (6 - Moderna risk series) 06/18/2021   Medicare Annual Wellness (AWV)  05/28/2022   TETANUS/TDAP  06/15/2023   Pneumonia Vaccine 88 Years old  Completed   INFLUENZA VACCINE  Completed   DEXA SCAN  Completed   Zoster Vaccines- Shingrix  Completed   HPV VACCINES  Aged Out    Health Maintenance  Health Maintenance Due  Topic Date Due   COVID-19 Vaccine (6 - Moderna risk series) 06/18/2021   Medicare Annual Wellness (AWV)  05/28/2022    Colorectal cancer screening: No longer required.   Mammogram status: No longer required due to pt declines.  Bone Density status: Completed 10/18/21. Results reflect: Bone density results: OSTEOPOROSIS. Repeat every 2 years.  Lung Cancer Screening: (Low Dose CT Chest recommended if Age 86-80years, 30 pack-year currently smoking OR have quit w/in 15years.) does not qualify.   Lung Cancer Screening Referral: N/a  Additional Screening:  Hepatitis C Screening: does not qualify  Vision Screening: Recommended annual ophthalmology exams for early detection of glaucoma and other disorders of the eye. Is the patient up to date with their annual eye exam?  Yes  Who is the provider or what is the name of the office in which the patient attends annual eye exams?  Doesn't remember name If pt is not established with a provider, would they like to be referred to a provider to establish care? No .   Dental Screening: Recommended annual dental exams for proper oral hygiene  Community Resource Referral / Chronic Care Management: CRR required this visit?  No   CCM required this visit?  No      Plan:     I have personally reviewed and noted the following in the patient's chart:   Medical and social history Use of alcohol, tobacco or illicit drugs  Current medications and supplements including opioid prescriptions. Patient is not currently taking opioid prescriptions. Functional ability and status Nutritional status Physical activity Advanced directives List of other physicians Hospitalizations, surgeries, and ER visits in previous 12 months Vitals Screenings to include cognitive, depression, and falls Referrals and appointments  In addition, I have reviewed and discussed with patient certain preventive protocols, quality metrics, and best practice recommendations. A written personalized care plan for preventive services as well as general preventive health recommendations were provided to patient.     BBeatris Ship [place]  06/03/2022   Nurse Notes: None

## 2022-06-06 NOTE — Telephone Encounter (Signed)
Called pt back lvm to see who called  Dont see where pt have appt coming up

## 2022-07-10 NOTE — Telephone Encounter (Signed)
Forwarding to rx prior auth team.

## 2022-07-11 NOTE — Telephone Encounter (Signed)
Prolia VOB initiated via [email or web address] 

## 2022-07-16 DIAGNOSIS — M5431 Sciatica, right side: Secondary | ICD-10-CM | POA: Diagnosis not present

## 2022-08-05 ENCOUNTER — Other Ambulatory Visit (HOSPITAL_COMMUNITY): Payer: Self-pay

## 2022-08-05 NOTE — Telephone Encounter (Signed)
Pt ready for scheduling on or after 08/05/22  Out-of-pocket cost due at time of visit: $260  Primary: Tricare Prolia co-insurance: 0% Admin fee co-insurance: 20%  Secondary:  Prolia co-insurance:  Admin fee co-insurance:   Deductible: $240  Prior Auth:  PA# Valid:     ** This summary of benefits is an estimation of the patient's out-of-pocket cost. Exact cost may very based on individual plan coverage.

## 2022-08-08 ENCOUNTER — Ambulatory Visit (INDEPENDENT_AMBULATORY_CARE_PROVIDER_SITE_OTHER): Payer: Medicare Other | Admitting: Family Medicine

## 2022-08-08 ENCOUNTER — Ambulatory Visit (HOSPITAL_BASED_OUTPATIENT_CLINIC_OR_DEPARTMENT_OTHER)
Admission: RE | Admit: 2022-08-08 | Discharge: 2022-08-08 | Disposition: A | Discharge status: Home / Self Care | Payer: Medicare Other | Source: Ambulatory Visit | Attending: Family Medicine | Admitting: Family Medicine

## 2022-08-08 ENCOUNTER — Other Ambulatory Visit (HOSPITAL_BASED_OUTPATIENT_CLINIC_OR_DEPARTMENT_OTHER): Payer: Self-pay

## 2022-08-08 ENCOUNTER — Telehealth: Payer: Self-pay | Admitting: Family Medicine

## 2022-08-08 VITALS — BP 122/74 | HR 44 | Temp 98.0°F | Resp 16 | Ht <= 58 in | Wt 100.4 lb

## 2022-08-08 DIAGNOSIS — I10 Essential (primary) hypertension: Secondary | ICD-10-CM

## 2022-08-08 DIAGNOSIS — E538 Deficiency of other specified B group vitamins: Secondary | ICD-10-CM | POA: Diagnosis not present

## 2022-08-08 DIAGNOSIS — M5441 Lumbago with sciatica, right side: Secondary | ICD-10-CM | POA: Insufficient documentation

## 2022-08-08 DIAGNOSIS — M545 Low back pain, unspecified: Secondary | ICD-10-CM | POA: Diagnosis not present

## 2022-08-08 DIAGNOSIS — E782 Mixed hyperlipidemia: Secondary | ICD-10-CM

## 2022-08-08 DIAGNOSIS — E559 Vitamin D deficiency, unspecified: Secondary | ICD-10-CM

## 2022-08-08 DIAGNOSIS — G8929 Other chronic pain: Secondary | ICD-10-CM

## 2022-08-08 DIAGNOSIS — G3184 Mild cognitive impairment, so stated: Secondary | ICD-10-CM | POA: Insufficient documentation

## 2022-08-08 DIAGNOSIS — M5442 Lumbago with sciatica, left side: Secondary | ICD-10-CM | POA: Diagnosis not present

## 2022-08-08 DIAGNOSIS — R739 Hyperglycemia, unspecified: Secondary | ICD-10-CM

## 2022-08-08 DIAGNOSIS — M48061 Spinal stenosis, lumbar region without neurogenic claudication: Secondary | ICD-10-CM | POA: Diagnosis not present

## 2022-08-08 DIAGNOSIS — M5136 Other intervertebral disc degeneration, lumbar region: Secondary | ICD-10-CM | POA: Diagnosis not present

## 2022-08-08 HISTORY — DX: Mild cognitive impairment of uncertain or unknown etiology: G31.84

## 2022-08-08 LAB — LIPID PANEL
Cholesterol: 242 mg/dL — ABNORMAL HIGH (ref 0–200)
HDL: 96.9 mg/dL (ref >39.00)
LDL Cholesterol: 120 mg/dL — ABNORMAL HIGH (ref 0–99)
NonHDL: 144.72
Total CHOL/HDL Ratio: 2
Triglycerides: 123 mg/dL (ref 0.0–149.0)
VLDL: 24.6 mg/dL (ref 0.0–40.0)

## 2022-08-08 LAB — VITAMIN B12: Vitamin B-12: 977 pg/mL — ABNORMAL HIGH (ref 211–911)

## 2022-08-08 LAB — CBC WITH DIFFERENTIAL/PLATELET
Basophils Absolute: 0 10*3/uL (ref 0.0–0.1)
Basophils Relative: 0.4 % (ref 0.0–3.0)
Eosinophils Absolute: 0.2 10*3/uL (ref 0.0–0.7)
Eosinophils Relative: 3.5 % (ref 0.0–5.0)
HCT: 40.3 % (ref 36.0–46.0)
Hemoglobin: 13.5 g/dL (ref 12.0–15.0)
Lymphocytes Relative: 31.5 % (ref 12.0–46.0)
Lymphs Abs: 1.4 10*3/uL (ref 0.7–4.0)
MCHC: 33.6 g/dL (ref 30.0–36.0)
MCV: 91.5 fl (ref 78.0–100.0)
Monocytes Absolute: 0.4 10*3/uL (ref 0.1–1.0)
Monocytes Relative: 9.6 % (ref 3.0–12.0)
Neutro Abs: 2.5 10*3/uL (ref 1.4–7.7)
Neutrophils Relative %: 55 % (ref 43.0–77.0)
Platelets: 216 10*3/uL (ref 150.0–400.0)
RBC: 4.41 Mil/uL (ref 3.87–5.11)
RDW: 14.3 % (ref 11.5–15.5)
WBC: 4.6 10*3/uL (ref 4.0–10.5)

## 2022-08-08 LAB — COMPREHENSIVE METABOLIC PANEL
ALT: 11 U/L (ref 0–35)
AST: 12 U/L (ref 0–37)
Albumin: 4.1 g/dL (ref 3.5–5.2)
Alkaline Phosphatase: 58 U/L (ref 39–117)
BUN: 26 mg/dL — ABNORMAL HIGH (ref 6–23)
CO2: 30 mEq/L (ref 19–32)
Calcium: 9.5 mg/dL (ref 8.4–10.5)
Chloride: 100 mEq/L (ref 96–112)
Creatinine, Ser: 0.8 mg/dL (ref 0.40–1.20)
GFR: 65.51 mL/min (ref >60.00)
Glucose, Bld: 93 mg/dL (ref 70–99)
Potassium: 3.9 mEq/L (ref 3.5–5.1)
Sodium: 139 mEq/L (ref 135–145)
Total Bilirubin: 0.7 mg/dL (ref 0.2–1.2)
Total Protein: 6.4 g/dL (ref 6.0–8.3)

## 2022-08-08 LAB — HEMOGLOBIN A1C: Hgb A1c MFr Bld: 5.8 % (ref 4.6–6.5)

## 2022-08-08 LAB — VITAMIN D 25 HYDROXY (VIT D DEFICIENCY, FRACTURES): VITD: 77.12 ng/mL (ref 30.00–100.00)

## 2022-08-08 LAB — TSH: TSH: 1.44 u[IU]/mL (ref 0.35–5.50)

## 2022-08-08 MED ORDER — AREXVY 120 MCG/0.5ML IM SUSR
INTRAMUSCULAR | 0 refills | Status: DC
  Filled 2022-08-08: qty 1, 1d supply, fill #0

## 2022-08-08 MED ORDER — METHYLPREDNISOLONE 4 MG PO TABS: ORAL_TABLET | ORAL | 0 refills | Status: DC

## 2022-08-08 NOTE — Assessment & Plan Note (Signed)
Does daily puzzles and stays active no major concerns. Lives at [facility] with good family support

## 2022-08-08 NOTE — Assessment & Plan Note (Signed)
Well controlled, no changes to meds. Encouraged heart healthy diet such as the DASH diet and exercise as tolerated.  °

## 2022-08-08 NOTE — Assessment & Plan Note (Signed)
Started a couple months ago, no fall or trauma, improved with steroids but has worsened again, using Lidocaine gel and Diclofenac gel and 2 tylenol bid. Will continue and prescribe Medrol dosepak and check xray

## 2022-08-08 NOTE — Telephone Encounter (Signed)
Pt called stating that her insurance is not going to cover the RSV vaccine through the pharmacy and has opted not to get it.

## 2022-08-08 NOTE — Patient Instructions (Addendum)
Step 1) lab work Step 2) check out Step 3) elevator to first floor Step 4) turn left to xray Step 5 ) turn right to pharmacy to get Arexvy, RSV Respiratory Syncitial virus shot      Diclofenac or Aspercreme Arthritis but only one at a time, can mix Aspercreme Lidocaine with any of the previous options. Continue Tylenol twice daily and add Medrol dosepak, consider physical therapy at [facility] if pain persists  Remember Aspercreme Arthritis and Voltaren gel are both the same medicine known as Diclofenac. Lumbosacral Strain Lumbosacral strain is an injury that causes pain in the lower back (lumbosacral spine). This injury usually happens from overstretching the muscles or ligaments along your spine. Ligaments are cord-like tissues that connect bones to other bones. A strain can affect one or more muscles or ligaments. What are the causes? This condition may be caused by: A hard, direct hit to the back. Overstretching the lower back muscles. This may result from: A fall. Lifting something heavy. Repetitive movements such as bending or crouching. What increases the risk? The following factors may make you more likely to develop this condition: Participating in sports or activities that involve: A sudden twist of the back. Pushing or pulling motions. Being overweight or obese. Having poor strength and flexibility, especially tight hamstrings or weak muscles in the back or abdomen. Having too much of a curve in the lower back. Having a pelvis that is tilted forward. What are the signs or symptoms? The main symptom of this condition is pain in the lower back, at the site of the strain. Pain may also be felt down one or both legs. How is this diagnosed? This condition is diagnosed based on your symptoms, your medical history, and a physical exam. During the physical exam, your health care provider may push on certain areas of your back to find the source of your pain. You may be asked  to bend forward, backward, and side to side to check your pain and range of motion. You may also have imaging tests, such as X-rays and an MRI. How is this treated? This condition may be treated by: Applying heat and cold on the affected area. Taking medicines to help relieve pain and relax your muscles. Taking NSAIDs, such as ibuprofen, to help reduce swelling and discomfort. Doing stretching and strengthening exercises for your lower back. Symptoms usually improve within several weeks of treatment. However, recovery time varies. When your symptoms improve, gradually return to your normal routine as soon as possible to reduce pain, avoid stiffness, and keep muscle strength. Follow these instructions at home: Medicines Take over-the-counter and prescription medicines only as told by your health care provider. Ask your health care provider if the medicine prescribed to you: Requires you to avoid driving or using heavy machinery. Can cause constipation. You may need to take these actions to prevent or treat constipation: Drink enough fluid to keep your urine pale yellow. Take over-the-counter or prescription medicines. Eat foods that are high in fiber, such as beans, whole grains, and fresh fruits and vegetables. Limit foods that are high in fat and processed sugars, such as fried or sweet foods. Managing pain, stiffness, and swelling     If directed, put ice on the injured area. To do this: Put ice in a plastic bag. Place a towel between your skin and the bag. Leave the ice on for 20 minutes, 2-3 times a day. If directed, apply heat on the affected area as often as told  by your health care provider. Use the heat source that your health care provider recommends, such as a moist heat pack or a heating pad. Place a towel between your skin and the heat source. Leave the heat on for 20-30 minutes. Remove the heat if your skin turns bright red. This is especially important if you are unable to  feel pain, heat, or cold. You may have a greater risk of getting burned. Activity Rest as told by your health care provider. Do not stay in bed. Staying in bed for more than 1-2 days can delay your recovery. Return to your normal activities as told by your health care provider. Ask your health care provider what activities are safe for you. Avoid activities that take a lot of energy for as long as told by your health care provider. Do exercises as told by your health care provider. This includes stretching and strengthening exercises. General instructions Sit up and stand up straight. Avoid leaning forward when you sit, or hunching over when you stand. Do not use any products that contain nicotine or tobacco, such as cigarettes, e-cigarettes, and chewing tobacco. If you need help quitting, ask your health care provider. Keep all follow-up visits as told by your health care provider. This is important. How is this prevented?  Use correct form when playing sports and lifting heavy objects. Use good posture when sitting and standing. Maintain a healthy weight. Sleep on a mattress with medium firmness to support your back. Do at least 150 minutes of moderate-intensity exercise each week, such as brisk walking or water aerobics. Try a form of exercise that takes stress off your back, such as swimming or stationary cycling. Maintain physical fitness, including: Strength. Flexibility. Contact a health care provider if: Your back pain does not improve after several weeks of treatment. Your symptoms get worse. Get help right away if: Your back pain is severe. You cannot stand or walk. You have difficulty controlling when you urinate or when you have a bowel movement. You feel nauseous or you vomit. Your feet or legs get very cold, turn pale, or look blue. You have numbness, tingling, weakness, or problems using your arms or legs. You develop any of the following: Shortness of  breath. Dizziness. Pain in your legs. Weakness in your buttocks or legs. Summary Lumbosacral strain is an injury that causes pain in the lower back (lumbosacral spine). This injury usually happens from overstretching the muscles or ligaments along your spine. This condition may be caused by a direct hit to the lower back or by overstretching the lower back muscles. Symptoms usually improve within several weeks of treatment. This information is not intended to replace advice given to you by your health care provider. Make sure you discuss any questions you have with your health care provider. Document Revised: 10/22/2021 Document Reviewed: 12/08/2018 Elsevier Patient Education  [place].

## 2022-08-08 NOTE — Progress Notes (Signed)
Subjective:   By signing my name below, I, Kellie Simmering, attest that this documentation has been prepared under the direction and in the presence of Mosie Lukes, MD., 08/08/2022.   Patient ID: Jennifer Wilkins, female    DOB: 1932/01/03, 89 y.o.   MRN: [id number]  Chief Complaint  Patient presents with   Follow-up    Follow up   HPI Patient is in today for an office visit. She denies CP/palpitations/SOB/HA/congestion/ fever/chills/GI or GU symptoms.  Lower Back Pain Patient reports that she experienced a spike in her lower back pain 1.5 months ago. She reports that the pain returned and worsened 1 month ago and is radiating bilaterally down her legs. Denies any falls, trauma, or incontinence. She was prescribed Prednisone 5 mg to manage this pain which was tolerable and provided temporary relief. She has also been applying Aspercreme and Lidocaine gel.  Osteoporosis Patient will return on 08/18/2022 to receive a Prolia injection.  Supplements She continues to take flaxseed oil and vitamin D daily but is no longer taking vitamin B12.  Past Medical History:  Diagnosis Date   Anemia 09/30/2016   Arthritis    Elevated BP 04/21/2014   HTN (hypertension)    Hypercholesteremia    Hypocalcemia 06/19/2013   Low back pain radiating to both legs 08/27/2015   Medicare annual wellness visit, subsequent 05/03/2015   Preventative health care 06/02/2016   Stress reaction 04/25/2015    Past Surgical History:  Procedure Laterality Date   CHOLECYSTECTOMY     GALLBLADDER SURGERY  1984   Gallstone removal   LUMBAR LAMINECTOMY/DECOMPRESSION MICRODISCECTOMY N/A 09/11/2016   Procedure: Microlumbar Decompression L3-4 and L4-5, lateral mass fusion at L4-5 using autologous, autograft bone;  Surgeon: Susa Day, MD;  Location: [facility];  Service: Orthopedics;  Laterality: N/A;    Family History  Problem Relation Age of Onset   Osteoporosis Mother    Ulcers Father    Kidney disease Paternal  Grandfather     Social History   Socioeconomic History   Marital status: Widowed    Spouse name: Not on file   Number of children: Not on file   Years of education: Not on file   Highest education level: Not on file  Occupational History   Not on file  Tobacco Use   Smoking status: Never   Smokeless tobacco: Never  Substance and Sexual Activity   Alcohol use: No   Drug use: No   Sexual activity: Not Currently  Other Topics Concern   Not on file  Social History Narrative   Widowed July 2017   Social Determinants of Health   Financial Resource Strain: Low Risk  (05/28/2021)   Overall Financial Resource Strain (CARDIA)    Difficulty of Paying Living Expenses: Not hard at all  Food Insecurity: No Food Insecurity (05/28/2021)   Hunger Vital Sign    Worried About Running Out of Food in the Last Year: Never true    Ran Out of Food in the Last Year: Never true  Transportation Needs: No Transportation Needs (05/28/2021)   PRAPARE - Hydrologist (Medical): No    Lack of Transportation (Non-Medical): No  Physical Activity: Sufficiently Active (05/28/2021)   Exercise Vital Sign    Days of Exercise per Week: 7 days    Minutes of Exercise per Session: 60 min  Stress: No Stress Concern Present (05/28/2021)   [place]    Feeling  of Stress : Not at all  Social Connections: Moderately Isolated (05/28/2021)   Social Connection and Isolation Panel [NHANES]    Frequency of Communication with Friends and Family: More than three times a week    Frequency of Social Gatherings with Friends and Family: More than three times a week    Attends Religious Services: More than 4 times per year    Active Member of [employer] or Organizations: No    Attends Archivist Meetings: Never    Marital Status: Widowed  Intimate Partner Violence: Not At Risk (05/28/2021)   Humiliation, Afraid, Rape, and  Kick questionnaire    Fear of Current or Ex-Partner: No    Emotionally Abused: No    Physically Abused: No    Sexually Abused: No    Outpatient Medications Prior to Visit  Medication Sig Dispense Refill   aspirin 81 MG tablet Take 1 tablet (81 mg total) by mouth daily. Resume 4 days post-op 30 tablet    Calcium Carbonate-Vitamin D 600-400 MG-UNIT tablet Take 1 tablet by mouth 2 (two) times daily.     carvedilol (COREG) 12.5 MG tablet Take 1 tablet (12.5 mg total) by mouth daily. 90 tablet 1   Cyanocobalamin (VITAMIN B-12 CR) 1500 MCG TBCR Take by mouth daily.     Ferrous Sulfate (IRON PO) Take by mouth daily.     FIBER ADULT GUMMIES PO Take 2 each by mouth.     hydrochlorothiazide (HYDRODIURIL) 25 MG tablet Take 1 tablet (25 mg total) by mouth daily. 90 tablet 0   Methyl Salicylate-Lido-Menthol (LIDOCAINE OINT & MEN-METH SAL EX) Apply topically.     Misc Natural Products (OSTEO BI-FLEX ADV JOINT SHIELD) TABS Take 1 tablet by mouth daily. Resume 5 days post-op     Multiple Vitamins-Minerals (MULTIVITAMIN WITH MINERALS) tablet Take 1 tablet by mouth daily. Resume 5 days post-op     Omega-3 Fatty Acids (FISH OIL) 1000 MG CAPS Take by mouth.     Probiotic Product (PROBIOTIC DAILY PO) Take by mouth.     simvastatin (ZOCOR) 40 MG tablet Take 40 mg by mouth daily at 6 PM.     Turmeric 500 MG CAPS Take 1 capsule by mouth daily. Resume 5 days post-op as needed     vitamin E 400 UNIT capsule Take 1 capsule (400 Units total) by mouth daily. Resume 5 days post-op     No facility-administered medications prior to visit.    No Known Allergies  Review of Systems  Constitutional:  Negative for chills and fever.  HENT:  Negative for congestion.   Respiratory:  Negative for shortness of breath.   Cardiovascular:  Negative for chest pain and palpitations.  Gastrointestinal:  Negative for abdominal pain, blood in stool, constipation, diarrhea, nausea and vomiting.  Genitourinary:  Negative for  dysuria, frequency, hematuria and urgency.  Musculoskeletal:  Positive for back pain (lower back).          Skin:           Neurological:  Negative for headaches.      Objective:    Physical Exam Constitutional:      General: She is not in acute distress.    Appearance: Normal appearance. She is normal weight. She is not ill-appearing.  HENT:     Head: Normocephalic and atraumatic.     Right Ear: External ear normal.     Left Ear: External ear normal.     Nose: Nose normal.     Mouth/Throat:  Mouth: Mucous membranes are moist.     Pharynx: Oropharynx is clear.  Eyes:     General:        Right eye: No discharge.        Left eye: No discharge.     Extraocular Movements: Extraocular movements intact.     Conjunctiva/sclera: Conjunctivae normal.     Pupils: Pupils are equal, round, and reactive to light.  Cardiovascular:     Rate and Rhythm: Normal rate and regular rhythm.     Pulses: Normal pulses.     Heart sounds: Normal heart sounds. No murmur heard.    No gallop.  Pulmonary:     Effort: Pulmonary effort is normal. No respiratory distress.     Breath sounds: Normal breath sounds. No wheezing or rales.  Abdominal:     General: Bowel sounds are normal.     Palpations: Abdomen is soft.     Tenderness: There is no abdominal tenderness. There is no guarding.  Musculoskeletal:        General: Normal range of motion.     Cervical back: Normal range of motion.     Right lower leg: No edema.     Left lower leg: No edema.  Skin:    General: Skin is warm and dry.  Neurological:     Mental Status: She is alert and oriented to person, place, and time.  Psychiatric:        Mood and Affect: Mood normal.        Behavior: Behavior normal.        Judgment: Judgment normal.     BP 122/74 (BP Location: Right Arm, Patient Position: Sitting, Cuff Size: Small)   Pulse (!) 44   Temp 98 F (36.7 C) (Oral)   Resp 16   Ht '4\' 8"'$  (1.422 m)   Wt 100 lb 6.4 oz (45.5 kg)   SpO2 99%    BMI 22.51 kg/m  Wt Readings from Last 3 Encounters:  08/08/22 100 lb 6.4 oz (45.5 kg)  06/03/22 104 lb (47.2 kg)  12/17/21 110 lb (49.9 kg)    Diabetic Foot Exam - Simple   No data filed    Lab Results  Component Value Date   WBC 5.0 12/17/2021   HGB 13.6 12/17/2021   HCT 40.8 12/17/2021   PLT 184.0 12/17/2021   GLUCOSE 96 12/17/2021   CHOL 223 (H) 12/17/2021   TRIG 129.0 12/17/2021   HDL 85.70 12/17/2021   LDLCALC 112 (H) 12/17/2021   ALT 16 12/17/2021   AST 19 12/17/2021   NA 139 12/17/2021   K 3.9 12/17/2021   CL 100 12/17/2021   CREATININE 0.86 12/17/2021   BUN 22 12/17/2021   CO2 30 12/17/2021   TSH 2.20 12/17/2021   HGBA1C 5.8 12/17/2021    Lab Results  Component Value Date   TSH 2.20 12/17/2021   Lab Results  Component Value Date   WBC 5.0 12/17/2021   HGB 13.6 12/17/2021   HCT 40.8 12/17/2021   MCV 90.0 12/17/2021   PLT 184.0 12/17/2021   Lab Results  Component Value Date   NA 139 12/17/2021   K 3.9 12/17/2021   CO2 30 12/17/2021   GLUCOSE 96 12/17/2021   BUN 22 12/17/2021   CREATININE 0.86 12/17/2021   BILITOT 0.8 12/17/2021   ALKPHOS 60 12/17/2021   AST 19 12/17/2021   ALT 16 12/17/2021   PROT 6.6 12/17/2021   ALBUMIN 4.1 12/17/2021   CALCIUM 9.3 12/17/2021  ANIONGAP 7 09/12/2016   GFR 60.33 12/17/2021   Lab Results  Component Value Date   CHOL 223 (H) 12/17/2021   Lab Results  Component Value Date   HDL 85.70 12/17/2021   Lab Results  Component Value Date   LDLCALC 112 (H) 12/17/2021   Lab Results  Component Value Date   TRIG 129.0 12/17/2021   Lab Results  Component Value Date   CHOLHDL 3 12/17/2021   Lab Results  Component Value Date   HGBA1C 5.8 12/17/2021      Assessment & Plan:  Immunizations: Reviewed patient's immunization history and encouraged RSV immunization.  Labs: Routine blood work today will also check vitamin B12 and vitamin D levels.  Lower Back Pain: Recommended 2 Tylenol bid and physical  therapy if pain persists. Methylprednisolone dose pack has been prescribed and a x-ray of the back has been ordered today. Problem List Items Addressed This Visit     Hyperlipidemia, mixed    Encouraged heart healthy diet, increase exercise, avoid trans fats, consider a krill oil cap daily      Relevant Orders   Lipid panel   Vitamin D deficiency    Supplement and monitor       Relevant Orders   VITAMIN D 25 Hydroxy (Vit-D Deficiency, Fractures)   Essential hypertension    Well controlled, no changes to meds. Encouraged heart healthy diet such as the DASH diet and exercise as tolerated.       Relevant Orders   CBC with Differential/Platelet   Comprehensive metabolic panel   TSH   Low back pain    Started a couple months ago, no fall or trauma, improved with steroids but has worsened again, using Lidocaine gel and Diclofenac gel and 2 tylenol bid. Will continue and prescribe Medrol dosepak and check xray      Relevant Medications   methylPREDNISolone (MEDROL) 4 MG tablet   Other Relevant Orders   DG Lumbar Spine Complete   Hyperglycemia - Primary    hgba1c acceptable, minimize simple carbs. Increase exercise as tolerated.       Relevant Orders   Hemoglobin A1c   Mild cognitive impairment    Does daily puzzles and stays active no major concerns. Lives at [facility] with good family support      Other Visit Diagnoses     Disorder of vitamin B12       Relevant Orders   Vitamin B12      Meds ordered this encounter  Medications   methylPREDNISolone (MEDROL) 4 MG tablet    Sig: 5 tabs po x 1 day then 4 tabs po x 1 day then 3 tabs po x 1 day then 2 tabs po x 1 day then 1 tab po x 1 day and stop    Dispense:  15 tablet    Refill:  0   I, Penni Homans, MD, personally preformed the services described in this documentation.  All medical record entries made by the scribe were at my direction and in my presence.  I have reviewed the chart and discharge instructions (if  applicable) and agree that the record reflects my personal performance and is accurate and complete. 08/08/2022  I,Mohammed Iqbal,acting as a scribe for Penni Homans, MD.,have documented all relevant documentation on the behalf of Penni Homans, MD,as directed by  Penni Homans, MD while in the presence of Penni Homans, MD.  Penni Homans, MD

## 2022-08-08 NOTE — Telephone Encounter (Signed)
Pt called stating she is due for her prolia injection on 1.21.23 or later per card given at last appt (7.20.23). Unsure if prolia needs to be rerun to check coverage prior to pt visit. Advised a note would be sent back to look into this before scheduling. Pt acknowledged understanding.

## 2022-08-08 NOTE — Assessment & Plan Note (Signed)
hgba1c acceptable, minimize simple carbs. Increase exercise as tolerated.  

## 2022-08-08 NOTE — Telephone Encounter (Signed)
Pt scheduled. See duplicate message.

## 2022-08-08 NOTE — Assessment & Plan Note (Signed)
Encouraged heart healthy diet, increase exercise, avoid trans fats, consider a krill oil cap daily 

## 2022-08-08 NOTE — Assessment & Plan Note (Signed)
Supplement and monitor 

## 2022-08-08 NOTE — Telephone Encounter (Signed)
PT scheduled.   Based on EOB in 2023: - no insurance changes and EOB from [place] are the exact same.  Pt ready for scheduling on or after 08/10/21   Out-of-pocket cost due at time of visit: $0.00   Primary: Medicare Prolia co-insurance: 20% (approximately $276) Admin fee co-insurance: 20% (approximately $25)   Secondary: [facility] Prolia co-insurance:  secondary plan does not follow Medicare guidelines. The secondary plan will consider the Medicare Part B deductible and coinsurance. Admin fee co-insurance:  secondary plan does not follow Medicare guidelines. The secondary plan will consider the Medicare Part B deductible and coinsurance.   Deductible:  secondary plan does not follow Medicare guidelines. The secondary plan will consider the Medicare Part B deductible and coinsurance.   Prior Auth: not required PA# Valid:    ** This summary of benefits is an estimation of the patient's out-of-pocket cost. Exact cost may vary based on individual plan coverage.

## 2022-08-09 NOTE — Telephone Encounter (Signed)
Prolia VOB initiated via [email or web address]  Last Prolia inj 02/14/22 Next prolia inj due 08/18/22

## 2022-08-12 ENCOUNTER — Encounter: Payer: Self-pay | Admitting: Family Medicine

## 2022-08-12 ENCOUNTER — Other Ambulatory Visit: Payer: Self-pay

## 2022-08-12 DIAGNOSIS — R293 Abnormal posture: Secondary | ICD-10-CM

## 2022-08-19 NOTE — Telephone Encounter (Signed)
Scheduled 08/20/22

## 2022-08-19 NOTE — Telephone Encounter (Signed)
Pt ready for scheduling on or after 08/18/22  Out-of-pocket cost due at time of visit: $0  Primary: Medicare Prolia co-insurance: 20% (approximately $290) Admin fee co-insurance: 20% (approximately $25)  Deductible: $0 of $240 met  Secondary: [facility] [employer] Supp Prolia co-insurance:  secondary plan does not follow Medicare guidelines. The secondary plan will consider the Medicare Part B deductible and coinsurance. Admin fee co-insurance:  secondary plan does not follow Medicare guidelines. The secondary plan will consider the Medicare Part B deductible and coinsurance.  Deductible:  secondary plan does not follow Medicare guidelines. The secondary plan will consider the Medicare Part B deductible and coinsurance.  Prior Auth: NOT required PA# Valid:   ** This summary of benefits is an estimation of the patient's out-of-pocket cost. Exact cost may vary based on individual plan coverage.

## 2022-08-20 ENCOUNTER — Ambulatory Visit (INDEPENDENT_AMBULATORY_CARE_PROVIDER_SITE_OTHER): Payer: Medicare Other | Admitting: *Deleted

## 2022-08-20 ENCOUNTER — Telehealth: Payer: Self-pay | Admitting: *Deleted

## 2022-08-20 DIAGNOSIS — M810 Age-related osteoporosis without current pathological fracture: Secondary | ICD-10-CM | POA: Diagnosis not present

## 2022-08-20 MED ORDER — DENOSUMAB 60 MG/ML ~~LOC~~ SOSY
60.0000 mg | PREFILLED_SYRINGE | Freq: Once | SUBCUTANEOUS | Status: AC
  Administered 2022-08-20: 60 mg via SUBCUTANEOUS

## 2022-08-20 NOTE — Progress Notes (Signed)
Patient here for prolia injection per physicians orders.  Injection given left subq and patient tolerated well.  

## 2022-08-20 NOTE — Telephone Encounter (Signed)
-----  Message from Doylene Canning, [place] sent at 08/20/2022 10:08 AM EST ----- Prolia given 08/20/22

## 2022-08-21 ENCOUNTER — Ambulatory Visit (HOSPITAL_BASED_OUTPATIENT_CLINIC_OR_DEPARTMENT_OTHER)
Admission: RE | Admit: 2022-08-21 | Discharge: 2022-08-21 | Disposition: A | Discharge status: Home / Self Care | Payer: Medicare Other | Source: Ambulatory Visit | Attending: Family Medicine | Admitting: Family Medicine

## 2022-08-21 DIAGNOSIS — R293 Abnormal posture: Secondary | ICD-10-CM | POA: Diagnosis not present

## 2022-08-21 DIAGNOSIS — M549 Dorsalgia, unspecified: Secondary | ICD-10-CM | POA: Diagnosis not present

## 2022-08-29 NOTE — Telephone Encounter (Signed)
Last Prolia inj 08/20/22 Next Prolia inj due 02/19/23

## 2022-09-02 ENCOUNTER — Other Ambulatory Visit: Payer: Self-pay

## 2022-09-02 ENCOUNTER — Telehealth: Payer: Self-pay | Admitting: Family Medicine

## 2022-09-02 MED ORDER — HYDROCHLOROTHIAZIDE 25 MG PO TABS: 25.0000 mg | ORAL_TABLET | Freq: Every day | ORAL | 0 refills | Status: DC

## 2022-09-02 MED ORDER — CARVEDILOL 12.5 MG PO TABS: 12.5000 mg | ORAL_TABLET | Freq: Every day | ORAL | 1 refills | Status: DC

## 2022-09-02 NOTE — Telephone Encounter (Signed)
Please note: Patient is okay to start cholesterol medication recommended from labwork done 08/08/22 so it can be sent in to [facility]. Please call to advise her about which medication it will be.   Prescription Request  09/02/2022  Is this a "Controlled Substance" medicine? No  LOV: 08/08/2022  What is the name of the medication or equipment? carvedilol (COREG) 12.5 MG tablet   hydrochlorothiazide (HYDRODIURIL) 25 MG tablet   Have you contacted your pharmacy to request a refill? No   Which pharmacy would you like this sent to?  [place], [place] [address] Phone: [phone] Fax: [phone]     Patient notified that their request is being sent to the clinical staff for review and that they should receive a response within 2 business days.   Please advise at Mobile [phone] (mobile)

## 2022-09-02 NOTE — Telephone Encounter (Signed)
Medication sent..

## 2022-09-24 NOTE — Telephone Encounter (Signed)
Forwarding to Rx Prior Auth Team 

## 2022-11-18 ENCOUNTER — Other Ambulatory Visit: Payer: Self-pay

## 2022-11-18 ENCOUNTER — Telehealth: Payer: Self-pay | Admitting: Family Medicine

## 2022-11-18 MED ORDER — CARVEDILOL 12.5 MG PO TABS: 12.5000 mg | ORAL_TABLET | Freq: Every day | ORAL | 1 refills | Status: DC

## 2022-11-18 MED ORDER — HYDROCHLOROTHIAZIDE 25 MG PO TABS: 25.0000 mg | ORAL_TABLET | Freq: Every day | ORAL | 0 refills | Status: DC

## 2022-11-18 NOTE — Telephone Encounter (Signed)
Prescription Request  11/18/2022  Is this a "Controlled Substance" medicine? No  LOV: 08/08/2022  What is the name of the medication or equipment? hydrochlorothiazide (HYDRODIURIL) 25 MG tablet   carvedilol (COREG) 12.5 MG tablet   Have you contacted your pharmacy to request a refill? No   Which pharmacy would you like this sent to?  [facility] - [place], [place] - [address] [place] [address] Phone: [phone] Fax: [phone]     Patient notified that their request is being sent to the clinical staff for review and that they should receive a response within 2 business days.   Please advise at Mobile [phone] (mobile)

## 2022-11-18 NOTE — Telephone Encounter (Signed)
Medication sent.

## 2022-12-05 ENCOUNTER — Ambulatory Visit (INDEPENDENT_AMBULATORY_CARE_PROVIDER_SITE_OTHER): Payer: Medicare Other | Admitting: Family Medicine

## 2022-12-05 VITALS — BP 110/72 | HR 45 | Temp 97.4°F | Resp 16 | Ht <= 58 in | Wt 98.4 lb

## 2022-12-05 DIAGNOSIS — E782 Mixed hyperlipidemia: Secondary | ICD-10-CM | POA: Diagnosis not present

## 2022-12-05 DIAGNOSIS — M48061 Spinal stenosis, lumbar region without neurogenic claudication: Secondary | ICD-10-CM

## 2022-12-05 DIAGNOSIS — R739 Hyperglycemia, unspecified: Secondary | ICD-10-CM | POA: Diagnosis not present

## 2022-12-05 DIAGNOSIS — E559 Vitamin D deficiency, unspecified: Secondary | ICD-10-CM

## 2022-12-05 DIAGNOSIS — M810 Age-related osteoporosis without current pathological fracture: Secondary | ICD-10-CM

## 2022-12-05 DIAGNOSIS — I10 Essential (primary) hypertension: Secondary | ICD-10-CM

## 2022-12-05 LAB — LIPID PANEL
Cholesterol: 231 mg/dL — ABNORMAL HIGH (ref 0–200)
HDL: 103.3 mg/dL (ref >39.00)
LDL Cholesterol: 111 mg/dL — ABNORMAL HIGH (ref 0–99)
NonHDL: 127.51
Total CHOL/HDL Ratio: 2
Triglycerides: 82 mg/dL (ref 0.0–149.0)
VLDL: 16.4 mg/dL (ref 0.0–40.0)

## 2022-12-05 LAB — COMPREHENSIVE METABOLIC PANEL
ALT: 11 U/L (ref 0–35)
AST: 17 U/L (ref 0–37)
Albumin: 4 g/dL (ref 3.5–5.2)
Alkaline Phosphatase: 52 U/L (ref 39–117)
BUN: 31 mg/dL — ABNORMAL HIGH (ref 6–23)
CO2: 28 mEq/L (ref 19–32)
Calcium: 9.4 mg/dL (ref 8.4–10.5)
Chloride: 102 mEq/L (ref 96–112)
Creatinine, Ser: 1.03 mg/dL (ref 0.40–1.20)
GFR: 48.26 mL/min — ABNORMAL LOW (ref >60.00)
Glucose, Bld: 87 mg/dL (ref 70–99)
Potassium: 3.9 mEq/L (ref 3.5–5.1)
Sodium: 144 mEq/L (ref 135–145)
Total Bilirubin: 0.6 mg/dL (ref 0.2–1.2)
Total Protein: 6.5 g/dL (ref 6.0–8.3)

## 2022-12-05 LAB — HEMOGLOBIN A1C: Hgb A1c MFr Bld: 5.7 % (ref 4.6–6.5)

## 2022-12-05 LAB — CBC WITH DIFFERENTIAL/PLATELET
Basophils Absolute: 0 10*3/uL (ref 0.0–0.1)
Basophils Relative: 0.6 % (ref 0.0–3.0)
Eosinophils Absolute: 0.1 10*3/uL (ref 0.0–0.7)
Eosinophils Relative: 1.4 % (ref 0.0–5.0)
HCT: 38.1 % (ref 36.0–46.0)
Hemoglobin: 12.8 g/dL (ref 12.0–15.0)
Lymphocytes Relative: 32.2 % (ref 12.0–46.0)
Lymphs Abs: 1.6 10*3/uL (ref 0.7–4.0)
MCHC: 33.6 g/dL (ref 30.0–36.0)
MCV: 89.6 fl (ref 78.0–100.0)
Monocytes Absolute: 0.5 10*3/uL (ref 0.1–1.0)
Monocytes Relative: 10.1 % (ref 3.0–12.0)
Neutro Abs: 2.8 10*3/uL (ref 1.4–7.7)
Neutrophils Relative %: 55.7 % (ref 43.0–77.0)
Platelets: 223 10*3/uL (ref 150.0–400.0)
RBC: 4.26 Mil/uL (ref 3.87–5.11)
RDW: 14 % (ref 11.5–15.5)
WBC: 5.1 10*3/uL (ref 4.0–10.5)

## 2022-12-05 LAB — VITAMIN D 25 HYDROXY (VIT D DEFICIENCY, FRACTURES): VITD: 86.77 ng/mL (ref 30.00–100.00)

## 2022-12-05 LAB — TSH: TSH: 1.44 u[IU]/mL (ref 0.35–5.50)

## 2022-12-05 NOTE — Assessment & Plan Note (Signed)
Encouraged to get adequate exercise, calcium and vitamin d intake. Dexa next year

## 2022-12-05 NOTE — Assessment & Plan Note (Signed)
Supplement and monitor 

## 2022-12-05 NOTE — Patient Instructions (Addendum)
1 Tylenol ES 500 mg tab and 1 Advil 200 mg morning and night Can add a third dose mid day if needed  Hypertension, Adult High blood pressure (hypertension) is when the force of blood pumping through the arteries is too strong. The arteries are the blood vessels that carry blood from the heart throughout the body. Hypertension forces the heart to work harder to pump blood and may cause arteries to become narrow or stiff. Untreated or uncontrolled hypertension can lead to a heart attack, heart failure, a stroke, kidney disease, and other problems. A blood pressure reading consists of a higher number over a lower number. Ideally, your blood pressure should be below 120/80. The first ("top") number is called the systolic pressure. It is a measure of the pressure in your arteries as your heart beats. The second ("bottom") number is called the diastolic pressure. It is a measure of the pressure in your arteries as the heart relaxes. What are the causes? The exact cause of this condition is not known. There are some conditions that result in high blood pressure. What increases the risk? Certain factors may make you more likely to develop high blood pressure. Some of these risk factors are under your control, including: Smoking. Not getting enough exercise or physical activity. Being overweight. Having too much fat, sugar, calories, or salt (sodium) in your diet. Drinking too much alcohol. Other risk factors include: Having a personal history of heart disease, diabetes, high cholesterol, or kidney disease. Stress. Having a family history of high blood pressure and high cholesterol. Having obstructive sleep apnea. Age. The risk increases with age. What are the signs or symptoms? High blood pressure may not cause symptoms. Very high blood pressure (hypertensive crisis) may cause: Headache. Fast or irregular heartbeats (palpitations). Shortness of breath. Nosebleed. Nausea and vomiting. Vision  changes. Severe chest pain, dizziness, and seizures. How is this diagnosed? This condition is diagnosed by measuring your blood pressure while you are seated, with your arm resting on a flat surface, your legs uncrossed, and your feet flat on the floor. The cuff of the blood pressure monitor will be placed directly against the skin of your upper arm at the level of your heart. Blood pressure should be measured at least twice using the same arm. Certain conditions can cause a difference in blood pressure between your right and left arms. If you have a high blood pressure reading during one visit or you have normal blood pressure with other risk factors, you may be asked to: Return on a different day to have your blood pressure checked again. Monitor your blood pressure at home for 1 week or longer. If you are diagnosed with hypertension, you may have other blood or imaging tests to help your health care provider understand your overall risk for other conditions. How is this treated? This condition is treated by making healthy lifestyle changes, such as eating healthy foods, exercising more, and reducing your alcohol intake. You may be referred for counseling on a healthy diet and physical activity. Your health care provider may prescribe medicine if lifestyle changes are not enough to get your blood pressure under control and if: Your systolic blood pressure is above 130. Your diastolic blood pressure is above 80. Your personal target blood pressure may vary depending on your medical conditions, your age, and other factors. Follow these instructions at home: Eating and drinking  Eat a diet that is high in fiber and potassium, and low in sodium, added sugar, and fat.  An example of this eating plan is called the DASH diet. DASH stands for Dietary Approaches to Stop Hypertension. To eat this way: Eat plenty of fresh fruits and vegetables. Try to fill one half of your plate at each meal with fruits and  vegetables. Eat whole grains, such as whole-wheat pasta, brown rice, or whole-grain bread. Fill about one fourth of your plate with whole grains. Eat or drink low-fat dairy products, such as skim milk or low-fat yogurt. Avoid fatty cuts of meat, processed or cured meats, and poultry with skin. Fill about one fourth of your plate with lean proteins, such as fish, chicken without skin, beans, eggs, or tofu. Avoid pre-made and processed foods. These tend to be higher in sodium, added sugar, and fat. Reduce your daily sodium intake. Many people with hypertension should eat less than 1,500 mg of sodium a day. Do not drink alcohol if: Your health care provider tells you not to drink. You are pregnant, may be pregnant, or are planning to become pregnant. If you drink alcohol: Limit how much you have to: 0-1 drink a day for women. 0-2 drinks a day for men. Know how much alcohol is in your drink. In the [place] one drink equals one 12 oz bottle of beer (355 mL), one 5 oz glass of wine (148 mL), or one 1 oz glass of hard liquor (44 mL). Lifestyle  Work with your health care provider to maintain a healthy body weight or to lose weight. Ask what an ideal weight is for you. Get at least 30 minutes of exercise that causes your heart to beat faster (aerobic exercise) most days of the week. Activities may include walking, swimming, or biking. Include exercise to strengthen your muscles (resistance exercise), such as Pilates or lifting weights, as part of your weekly exercise routine. Try to do these types of exercises for 30 minutes at least 3 days a week. Do not use any products that contain nicotine or tobacco. These products include cigarettes, chewing tobacco, and vaping devices, such as e-cigarettes. If you need help quitting, ask your health care provider. Monitor your blood pressure at home as told by your health care provider. Keep all follow-up visits. This is important. Medicines Take  over-the-counter and prescription medicines only as told by your health care provider. Follow directions carefully. Blood pressure medicines must be taken as prescribed. Do not skip doses of blood pressure medicine. Doing this puts you at risk for problems and can make the medicine less effective. Ask your health care provider about side effects or reactions to medicines that you should watch for. Contact a health care provider if you: Think you are having a reaction to a medicine you are taking. Have headaches that keep coming back (recurring). Feel dizzy. Have swelling in your ankles. Have trouble with your vision. Get help right away if you: Develop a severe headache or confusion. Have unusual weakness or numbness. Feel faint. Have severe pain in your chest or abdomen. Vomit repeatedly. Have trouble breathing. These symptoms may be an emergency. Get help right away. Call 911. Do not wait to see if the symptoms will go away. Do not drive yourself to the hospital. Summary Hypertension is when the force of blood pumping through your arteries is too strong. If this condition is not controlled, it may put you at risk for serious complications. Your personal target blood pressure may vary depending on your medical conditions, your age, and other factors. For most people, a normal blood pressure  is less than 120/80. Hypertension is treated with lifestyle changes, medicines, or a combination of both. Lifestyle changes include losing weight, eating a healthy, low-sodium diet, exercising more, and limiting alcohol. This information is not intended to replace advice given to you by your health care provider. Make sure you discuss any questions you have with your health care provider. Document Revised: 05/22/2021 Document Reviewed: 05/22/2021 Elsevier Patient Education  [place].

## 2022-12-05 NOTE — Progress Notes (Signed)
Subjective:   By signing my name below, I, Vickey Sages, attest that this documentation has been prepared under the direction and in the presence of Bradd Canary, MD., 12/05/2022.   Patient ID: Jennifer Wilkins, female    DOB: 16-Nov-1931, 89 y.o.   MRN: [id number]  Chief Complaint  Patient presents with   Follow-up    Follow up   HPI Patient is in today for an office visit.   Immunizations Patient received the RSV vaccination in 07/2022 and is due for the Tetanus vaccination in or after 05/2023, unless injured before then. She continues to receive annual COVID-19 and Influenza vaccinations.  Lower Back Pain Patient continues to experience chronic lower back pain and reports that she currently takes two Advil in the morning and at night which provides temporary relief. Tylenol has been ineffective. She is otherwise doing well today and denies CP/palpitations/SOB/HA/fever/chills/GI or GU symptoms.   Past Medical History:  Diagnosis Date   Anemia 09/30/2016   Arthritis    Elevated BP 04/21/2014   HTN (hypertension)    Hypercholesteremia    Hypocalcemia 06/19/2013   Low back pain radiating to both legs 08/27/2015   Medicare annual wellness visit, subsequent 05/03/2015   Preventative health care 06/02/2016   Stress reaction 04/25/2015    Past Surgical History:  Procedure Laterality Date   CHOLECYSTECTOMY     GALLBLADDER SURGERY  1984   Gallstone removal   LUMBAR LAMINECTOMY/DECOMPRESSION MICRODISCECTOMY N/A 09/11/2016   Procedure: Microlumbar Decompression L3-4 and L4-5, lateral mass fusion at L4-5 using autologous, autograft bone;  Surgeon: Jene Every, MD;  Location: [facility];  Service: Orthopedics;  Laterality: N/A;    Family History  Problem Relation Age of Onset   Osteoporosis Mother    Ulcers Father    Kidney disease Paternal Grandfather     Social History   Socioeconomic History   Marital status: Widowed    Spouse name: Not on file   Number of children: Not on file    Years of education: Not on file   Highest education level: Not on file  Occupational History   Not on file  Tobacco Use   Smoking status: Never   Smokeless tobacco: Never  Substance and Sexual Activity   Alcohol use: No   Drug use: No   Sexual activity: Not Currently  Other Topics Concern   Not on file  Social History Narrative   Widowed July 2017   Social Determinants of Health   Financial Resource Strain: Low Risk  (05/28/2021)   Overall Financial Resource Strain (CARDIA)    Difficulty of Paying Living Expenses: Not hard at all  Food Insecurity: No Food Insecurity (05/28/2021)   Hunger Vital Sign    Worried About Running Out of Food in the Last Year: Never true    Ran Out of Food in the Last Year: Never true  Transportation Needs: No Transportation Needs (05/28/2021)   PRAPARE - Administrator, Civil Service (Medical): No    Lack of Transportation (Non-Medical): No  Physical Activity: Sufficiently Active (05/28/2021)   Exercise Vital Sign    Days of Exercise per Week: 7 days    Minutes of Exercise per Session: 60 min  Stress: No Stress Concern Present (05/28/2021)   [employer] of Occupational Health - Occupational Stress Questionnaire    Feeling of Stress : Not at all  Social Connections: Moderately Isolated (05/28/2021)   Social Connection and Isolation Panel [NHANES]    Frequency of Communication  with Friends and Family: More than three times a week    Frequency of Social Gatherings with Friends and Family: More than three times a week    Attends Religious Services: More than 4 times per year    Active Member of [employer] or Organizations: No    Attends Banker Meetings: Never    Marital Status: Widowed  Intimate Partner Violence: Not At Risk (05/28/2021)   Humiliation, Afraid, Rape, and Kick questionnaire    Fear of Current or Ex-Partner: No    Emotionally Abused: No    Physically Abused: No    Sexually Abused: No    Outpatient  Medications Prior to Visit  Medication Sig Dispense Refill   aspirin 81 MG tablet Take 1 tablet (81 mg total) by mouth daily. Resume 4 days post-op 30 tablet    Calcium Carbonate-Vitamin D 600-400 MG-UNIT tablet Take 1 tablet by mouth 2 (two) times daily.     carvedilol (COREG) 12.5 MG tablet Take 1 tablet (12.5 mg total) by mouth daily. 90 tablet 1   Cyanocobalamin (VITAMIN B-12 CR) 1500 MCG TBCR Take by mouth daily.     Ferrous Sulfate (IRON PO) Take by mouth daily.     FIBER ADULT GUMMIES PO Take 2 each by mouth.     hydrochlorothiazide (HYDRODIURIL) 25 MG tablet Take 1 tablet (25 mg total) by mouth daily. 90 tablet 0   Misc Natural Products (OSTEO BI-FLEX ADV JOINT SHIELD) TABS Take 1 tablet by mouth daily. Resume 5 days post-op     Multiple Vitamins-Minerals (MULTIVITAMIN WITH MINERALS) tablet Take 1 tablet by mouth daily. Resume 5 days post-op     Omega-3 Fatty Acids (FISH OIL) 1000 MG CAPS Take by mouth.     Probiotic Product (PROBIOTIC DAILY PO) Take by mouth.     simvastatin (ZOCOR) 40 MG tablet Take 40 mg by mouth daily at 6 PM.     Turmeric 500 MG CAPS Take 1 capsule by mouth daily. Resume 5 days post-op as needed     vitamin E 400 UNIT capsule Take 1 capsule (400 Units total) by mouth daily. Resume 5 days post-op     Methyl Salicylate-Lido-Menthol (LIDOCAINE OINT & MEN-METH SAL EX) Apply topically.     methylPREDNISolone (MEDROL) 4 MG tablet 5 tabs po x 1 day then 4 tabs po x 1 day then 3 tabs po x 1 day then 2 tabs po x 1 day then 1 tab po x 1 day and stop 15 tablet 0   RSV vaccine recomb adjuvanted (AREXVY) 120 MCG/0.5ML injection Inject into the muscle. 1 each 0   No facility-administered medications prior to visit.    No Known Allergies  Review of Systems  Constitutional:  Negative for chills and fever.  Respiratory:  Negative for shortness of breath.   Cardiovascular:  Negative for chest pain and palpitations.  Gastrointestinal:  Negative for abdominal pain, blood in  stool, constipation, diarrhea, nausea and vomiting.  Genitourinary:  Negative for dysuria, frequency, hematuria and urgency.  Musculoskeletal:  Positive for back pain (lower back pain).  Skin:           Neurological:  Negative for headaches.       Objective:    Physical Exam Constitutional:      General: She is not in acute distress.    Appearance: Normal appearance. She is normal weight. She is not ill-appearing.  HENT:     Head: Normocephalic and atraumatic.     Right Ear: External  ear normal.     Left Ear: External ear normal.     Nose: Nose normal.     Mouth/Throat:     Mouth: Mucous membranes are moist.     Pharynx: Oropharynx is clear.  Eyes:     General:        Right eye: No discharge.        Left eye: No discharge.     Extraocular Movements: Extraocular movements intact.     Conjunctiva/sclera: Conjunctivae normal.     Pupils: Pupils are equal, round, and reactive to light.  Cardiovascular:     Rate and Rhythm: Normal rate and regular rhythm.     Pulses: Normal pulses.     Heart sounds: Normal heart sounds. No murmur heard.    No gallop.  Pulmonary:     Effort: Pulmonary effort is normal. No respiratory distress.     Breath sounds: Normal breath sounds. No wheezing or rales.  Abdominal:     General: Bowel sounds are normal.     Palpations: Abdomen is soft.     Tenderness: There is no abdominal tenderness. There is no guarding.  Musculoskeletal:        General: Normal range of motion.     Cervical back: Normal range of motion.     Right lower leg: No edema.     Left lower leg: No edema.  Skin:    General: Skin is warm and dry.  Neurological:     Mental Status: She is alert and oriented to person, place, and time.  Psychiatric:        Mood and Affect: Mood normal.        Behavior: Behavior normal.        Judgment: Judgment normal.     BP 110/72 (BP Location: Right Arm, Patient Position: Sitting, Cuff Size: Normal)   Pulse (!) 45   Temp (!) 97.4 F  (36.3 C) (Oral)   Resp 16   Ht 4\' 9"  (1.448 m)   Wt 98 lb 6.4 oz (44.6 kg)   SpO2 98%   BMI 21.29 kg/m  Wt Readings from Last 3 Encounters:  12/05/22 98 lb 6.4 oz (44.6 kg)  08/08/22 100 lb 6.4 oz (45.5 kg)  06/03/22 104 lb (47.2 kg)    Diabetic Foot Exam - Simple   No data filed    Lab Results  Component Value Date   WBC 5.1 12/05/2022   HGB 12.8 12/05/2022   HCT 38.1 12/05/2022   PLT 223.0 12/05/2022   GLUCOSE 87 12/05/2022   CHOL 231 (H) 12/05/2022   TRIG 82.0 12/05/2022   HDL 103.30 12/05/2022   LDLCALC 111 (H) 12/05/2022   ALT 11 12/05/2022   AST 17 12/05/2022   NA 144 12/05/2022   K 3.9 12/05/2022   CL 102 12/05/2022   CREATININE 1.03 12/05/2022   BUN 31 (H) 12/05/2022   CO2 28 12/05/2022   TSH 1.44 12/05/2022   HGBA1C 5.7 12/05/2022    Lab Results  Component Value Date   TSH 1.44 12/05/2022   Lab Results  Component Value Date   WBC 5.1 12/05/2022   HGB 12.8 12/05/2022   HCT 38.1 12/05/2022   MCV 89.6 12/05/2022   PLT 223.0 12/05/2022   Lab Results  Component Value Date   NA 144 12/05/2022   K 3.9 12/05/2022   CO2 28 12/05/2022   GLUCOSE 87 12/05/2022   BUN 31 (H) 12/05/2022   CREATININE 1.03 12/05/2022   BILITOT 0.6  12/05/2022   ALKPHOS 52 12/05/2022   AST 17 12/05/2022   ALT 11 12/05/2022   PROT 6.5 12/05/2022   ALBUMIN 4.0 12/05/2022   CALCIUM 9.4 12/05/2022   ANIONGAP 7 09/12/2016   GFR 48.26 (L) 12/05/2022   Lab Results  Component Value Date   CHOL 231 (H) 12/05/2022   Lab Results  Component Value Date   HDL 103.30 12/05/2022   Lab Results  Component Value Date   LDLCALC 111 (H) 12/05/2022   Lab Results  Component Value Date   TRIG 82.0 12/05/2022   Lab Results  Component Value Date   CHOLHDL 2 12/05/2022   Lab Results  Component Value Date   HGBA1C 5.7 12/05/2022      Assessment & Plan:  Healthy Lifestyle: Encouraged 6-8 hours of sleep, heart healthy diet, 60-80 oz of non-alcohol/non-caffeinated fluids, and  4000-8000 steps daily.  Immunizations: Encouraged patient to consider annual COVID-19 and Influenza vaccinations. Tetanus due in or after 05/2023 unless injured before then.  Labs: Routine blood work ordered today.  Lower Back Pain: Take one Advil (200 mg) and one Tylenol Extra Strength (500 mg) at morning and at night. Add one Advil and one Tylenol midday if pain is severe. Problem List Items Addressed This Visit     Essential hypertension - Primary    Well controlled, no changes to meds. Encouraged heart healthy diet such as the DASH diet and exercise as tolerated.       Relevant Orders   CBC with Differential/Platelet (Completed)   Comprehensive metabolic panel (Completed)   TSH (Completed)   Hyperglycemia    hgba1c acceptable, minimize simple carbs. Increase exercise as tolerated.       Relevant Orders   Hemoglobin A1c (Completed)   Hyperlipidemia, mixed    Encouraged heart healthy diet, increase exercise, avoid trans fats, consider a krill oil cap daily      Relevant Orders   Lipid panel (Completed)   Osteoporosis    Encouraged to get adequate exercise, calcium and vitamin d intake. Dexa next year      Spinal stenosis of lumbar region    Encouraged moist heat and gentle stretching as tolerated. May try NSAIDs and prescription meds as directed and report if symptoms worsen or seek immediate care       Vitamin D deficiency    Supplement and monitor       Relevant Orders   VITAMIN D 25 Hydroxy (Vit-D Deficiency, Fractures) (Completed)   No orders of the defined types were placed in this encounter.  I, Danise Edge, MD, personally preformed the services described in this documentation.  All medical record entries made by the scribe were at my direction and in my presence.  I have reviewed the chart and discharge instructions (if applicable) and agree that the record reflects my personal performance and is accurate and complete. 12/05/2022  I,Mohammed Iqbal,acting as a  scribe for Danise Edge, MD.,have documented all relevant documentation on the behalf of Danise Edge, MD,as directed by  Danise Edge, MD while in the presence of Danise Edge, MD.  Danise Edge, MD

## 2022-12-05 NOTE — Assessment & Plan Note (Signed)
hgba1c acceptable, minimize simple carbs. Increase exercise as tolerated.  

## 2022-12-05 NOTE — Assessment & Plan Note (Signed)
Encouraged moist heat and gentle stretching as tolerated. May try NSAIDs and prescription meds as directed and report if symptoms worsen or seek immediate care 

## 2022-12-05 NOTE — Assessment & Plan Note (Signed)
Encouraged heart healthy diet, increase exercise, avoid trans fats, consider a krill oil cap daily 

## 2022-12-05 NOTE — Assessment & Plan Note (Signed)
Well controlled, no changes to meds. Encouraged heart healthy diet such as the DASH diet and exercise as tolerated.  °

## 2022-12-07 ENCOUNTER — Encounter: Payer: Self-pay | Admitting: Family Medicine

## 2023-01-21 ENCOUNTER — Telehealth: Payer: Self-pay

## 2023-01-21 NOTE — Telephone Encounter (Signed)
Prolia VOB initiated via [email or web address]  Last Prolia inj: 08/20/22 Next Prolia inj DUE: 02/17/23 

## 2023-01-24 NOTE — Telephone Encounter (Signed)
Pt ready for scheduling for prolia on or after : 02/17/23  Out-of-pocket cost due at time of visit: $0  Primary: Medicare Prolia co-insurance: 0% Admin fee co-insurance: 0%  Secondary: T[employer]are Prolia co-insurance:  Admin fee co-insurance:   Medical Benefit Details: Date Benefits were checked: 01/22/23 Deductible: $240 met of $240 required/ Coinsurance: 0%/ Admin Fee: 0%  Prior Auth: N/A PA# Expiration Date:    Pharmacy benefit: Copay $--- If patient wants fill through the pharmacy benefit please send prescription to:  --- , and include estimated need by date in rx notes. Pharmacy will ship medication directly to the office.  Patient NOT eligible for Prolia Copay Card. Copay Card can make patient's cost as little as $25. Link to apply: [email or web address]://[email or web address]  ** This summary of benefits is an estimation of the patient's out-of-pocket cost. Exact cost may very based on individual plan coverage.

## 2023-02-10 ENCOUNTER — Encounter: Payer: Self-pay | Admitting: Family Medicine

## 2023-02-10 NOTE — Telephone Encounter (Signed)
Pt scheduled 02/19/23

## 2023-02-11 ENCOUNTER — Telehealth: Payer: Self-pay | Admitting: Family Medicine

## 2023-02-11 ENCOUNTER — Other Ambulatory Visit: Payer: Self-pay

## 2023-02-11 MED ORDER — HYDROCHLOROTHIAZIDE 25 MG PO TABS: 25.0000 mg | ORAL_TABLET | Freq: Every day | ORAL | 0 refills | Status: DC

## 2023-02-11 NOTE — Telephone Encounter (Signed)
 Refill sent.

## 2023-02-11 NOTE — Telephone Encounter (Signed)
Patient called and would like a med refill hydrochlorothiazide (HYDRODIURIL) 25 MG [facility]

## 2023-02-11 NOTE — Telephone Encounter (Signed)
Refill has been sent.  °

## 2023-02-19 ENCOUNTER — Ambulatory Visit (INDEPENDENT_AMBULATORY_CARE_PROVIDER_SITE_OTHER): Payer: Medicare Other

## 2023-02-19 ENCOUNTER — Telehealth: Payer: Self-pay | Admitting: *Deleted

## 2023-02-19 DIAGNOSIS — M810 Age-related osteoporosis without current pathological fracture: Secondary | ICD-10-CM | POA: Diagnosis not present

## 2023-02-19 MED ORDER — CARVEDILOL 12.5 MG PO TABS: 12.5000 mg | ORAL_TABLET | Freq: Every day | ORAL | 1 refills | Status: DC

## 2023-02-19 MED ORDER — DENOSUMAB 60 MG/ML ~~LOC~~ SOSY
60.0000 mg | PREFILLED_SYRINGE | Freq: Once | SUBCUTANEOUS | Status: AC
  Administered 2023-02-19: 60 mg via SUBCUTANEOUS

## 2023-02-19 NOTE — Telephone Encounter (Signed)
Prolia given today. 

## 2023-02-19 NOTE — Progress Notes (Signed)
Pt here for Prolia injection per PCP orders.  Injection given in left subQ. Pt handled well.

## 2023-02-26 ENCOUNTER — Encounter (INDEPENDENT_AMBULATORY_CARE_PROVIDER_SITE_OTHER): Payer: Self-pay

## 2023-03-12 NOTE — Assessment & Plan Note (Addendum)
Encouraged to get adequate exercise, calcium and vitamin d intake consider repeat Dexa scan next year.

## 2023-03-12 NOTE — Assessment & Plan Note (Signed)
Well controlled, no changes to meds. Encouraged heart healthy diet such as the DASH diet and exercise as tolerated.  °

## 2023-03-12 NOTE — Assessment & Plan Note (Signed)
Encouraged heart healthy diet, increase exercise, avoid trans fats, consider a krill oil cap daily 

## 2023-03-12 NOTE — Assessment & Plan Note (Signed)
hgba1c acceptable, minimize simple carbs. Increase exercise as tolerated.  

## 2023-03-12 NOTE — Assessment & Plan Note (Signed)
Supplement and monitor 

## 2023-03-12 NOTE — Assessment & Plan Note (Signed)
Struggles with pain daily but stays active

## 2023-03-13 ENCOUNTER — Ambulatory Visit: Payer: Medicare Other | Admitting: Family Medicine

## 2023-03-13 VITALS — BP 112/70 | HR 92 | Temp 97.8°F | Resp 16 | Ht <= 58 in | Wt 96.0 lb

## 2023-03-13 DIAGNOSIS — E782 Mixed hyperlipidemia: Secondary | ICD-10-CM

## 2023-03-13 DIAGNOSIS — R739 Hyperglycemia, unspecified: Secondary | ICD-10-CM

## 2023-03-13 DIAGNOSIS — M5441 Lumbago with sciatica, right side: Secondary | ICD-10-CM

## 2023-03-13 DIAGNOSIS — M5442 Lumbago with sciatica, left side: Secondary | ICD-10-CM

## 2023-03-13 DIAGNOSIS — E559 Vitamin D deficiency, unspecified: Secondary | ICD-10-CM

## 2023-03-13 DIAGNOSIS — G8929 Other chronic pain: Secondary | ICD-10-CM

## 2023-03-13 DIAGNOSIS — M810 Age-related osteoporosis without current pathological fracture: Secondary | ICD-10-CM | POA: Diagnosis not present

## 2023-03-13 DIAGNOSIS — I10 Essential (primary) hypertension: Secondary | ICD-10-CM

## 2023-03-13 DIAGNOSIS — E538 Deficiency of other specified B group vitamins: Secondary | ICD-10-CM

## 2023-03-13 LAB — CBC WITH DIFFERENTIAL/PLATELET
Basophils Absolute: 0 10*3/uL (ref 0.0–0.1)
Basophils Relative: 0.4 % (ref 0.0–3.0)
Eosinophils Absolute: 0.1 10*3/uL (ref 0.0–0.7)
Eosinophils Relative: 1.8 % (ref 0.0–5.0)
HCT: 39.4 % (ref 36.0–46.0)
Hemoglobin: 12.7 g/dL (ref 12.0–15.0)
Lymphocytes Relative: 30.5 % (ref 12.0–46.0)
Lymphs Abs: 1.4 10*3/uL (ref 0.7–4.0)
MCHC: 32.2 g/dL (ref 30.0–36.0)
MCV: 90.9 fl (ref 78.0–100.0)
Monocytes Absolute: 0.4 10*3/uL (ref 0.1–1.0)
Monocytes Relative: 9.8 % (ref 3.0–12.0)
Neutro Abs: 2.6 10*3/uL (ref 1.4–7.7)
Neutrophils Relative %: 57.5 % (ref 43.0–77.0)
Platelets: 215 10*3/uL (ref 150.0–400.0)
RBC: 4.33 Mil/uL (ref 3.87–5.11)
RDW: 14.7 % (ref 11.5–15.5)
WBC: 4.5 10*3/uL (ref 4.0–10.5)

## 2023-03-13 LAB — COMPREHENSIVE METABOLIC PANEL
ALT: 12 U/L (ref 0–35)
AST: 16 U/L (ref 0–37)
Albumin: 4.1 g/dL (ref 3.5–5.2)
Alkaline Phosphatase: 49 U/L (ref 39–117)
BUN: 37 mg/dL — ABNORMAL HIGH (ref 6–23)
CO2: 30 mEq/L (ref 19–32)
Calcium: 9.3 mg/dL (ref 8.4–10.5)
Chloride: 99 mEq/L (ref 96–112)
Creatinine, Ser: 0.88 mg/dL (ref 0.40–1.20)
GFR: 58.19 mL/min — ABNORMAL LOW (ref >60.00)
Glucose, Bld: 98 mg/dL (ref 70–99)
Potassium: 3.9 mEq/L (ref 3.5–5.1)
Sodium: 137 mEq/L (ref 135–145)
Total Bilirubin: 0.6 mg/dL (ref 0.2–1.2)
Total Protein: 6.3 g/dL (ref 6.0–8.3)

## 2023-03-13 LAB — LIPID PANEL
Cholesterol: 226 mg/dL — ABNORMAL HIGH (ref 0–200)
HDL: 97.5 mg/dL (ref >39.00)
LDL Cholesterol: 111 mg/dL — ABNORMAL HIGH (ref 0–99)
NonHDL: 128.59
Total CHOL/HDL Ratio: 2
Triglycerides: 86 mg/dL (ref 0.0–149.0)
VLDL: 17.2 mg/dL (ref 0.0–40.0)

## 2023-03-13 LAB — VITAMIN D 25 HYDROXY (VIT D DEFICIENCY, FRACTURES): VITD: 95.8 ng/mL (ref 30.00–100.00)

## 2023-03-13 LAB — HEMOGLOBIN A1C: Hgb A1c MFr Bld: 5.7 % (ref 4.6–6.5)

## 2023-03-13 LAB — TSH: TSH: 1.9 u[IU]/mL (ref 0.35–5.50)

## 2023-03-13 LAB — VITAMIN B12: Vitamin B-12: 473 pg/mL (ref 211–911)

## 2023-03-13 NOTE — Patient Instructions (Signed)

## 2023-03-16 ENCOUNTER — Encounter: Payer: Self-pay | Admitting: Family Medicine

## 2023-03-16 NOTE — Progress Notes (Signed)
Subjective:    Patient ID: Jennifer Wilkins, female    DOB: 1931-11-12, 89 y.o.   MRN: [id number]  Chief Complaint  Patient presents with  . Follow-up    Follow up    HPI Discussed the use of AI scribe software for clinical note transcription with the patient, who gave verbal consent to proceed.  History of Present Illness   The patient, an elderly woman with a history of bone health issues managed with Prolia, presents with a gradual decrease in energy levels. Despite this, she maintains an active lifestyle, walking over 10,000 steps daily. She has recently stopped taking her B12 supplement and multivitamin due to high B12 levels in her previous lab results. She reports no issues with sleep, bowel movements, or breathing. She lives in a group setting where flu and COVID-19 vaccines are provided.        Past Medical History:  Diagnosis Date  . Anemia 09/30/2016  . Arthritis   . Elevated BP 04/21/2014  . HTN (hypertension)   . Hypercholesteremia   . Hypocalcemia 06/19/2013  . Low back pain radiating to both legs 08/27/2015  . Medicare annual wellness visit, subsequent 05/03/2015  . Preventative health care 06/02/2016  . Stress reaction 04/25/2015    Past Surgical History:  Procedure Laterality Date  . CHOLECYSTECTOMY    . GALLBLADDER SURGERY  1984   Gallstone removal  . LUMBAR LAMINECTOMY/DECOMPRESSION MICRODISCECTOMY N/A 09/11/2016   Procedure: Microlumbar Decompression L3-4 and L4-5, lateral mass fusion at L4-5 using autologous, autograft bone;  Surgeon: Jene Every, MD;  Location: [facility];  Service: Orthopedics;  Laterality: N/A;    Family History  Problem Relation Age of Onset  . Osteoporosis Mother   . Ulcers Father   . Kidney disease Paternal Grandfather     Social History   Socioeconomic History  . Marital status: Widowed    Spouse name: Not on file  . Number of children: Not on file  . Years of education: Not on file  . Highest education level: Not on file   Occupational History  . Not on file  Tobacco Use  . Smoking status: Never  . Smokeless tobacco: Never  Substance and Sexual Activity  . Alcohol use: No  . Drug use: No  . Sexual activity: Not Currently  Other Topics Concern  . Not on file  Social History Narrative   Widowed July 2017   Social Determinants of Health   Financial Resource Strain: Low Risk  (05/28/2021)   Overall Financial Resource Strain (CARDIA)   . Difficulty of Paying Living Expenses: Not hard at all  Food Insecurity: No Food Insecurity (05/28/2021)   Hunger Vital Sign   . Worried About Programme researcher, broadcasting/film/video in the Last Year: Never true   . Ran Out of Food in the Last Year: Never true  Transportation Needs: No Transportation Needs (05/28/2021)   PRAPARE - Transportation   . Lack of Transportation (Medical): No   . Lack of Transportation (Non-Medical): No  Physical Activity: Sufficiently Active (05/28/2021)   Exercise Vital Sign   . Days of Exercise per Week: 7 days   . Minutes of Exercise per Session: 60 min  Stress: No Stress Concern Present (05/28/2021)   [employer] of Occupational Health - Occupational Stress Questionnaire   . Feeling of Stress : Not at all  Social Connections: Moderately Isolated (05/28/2021)   Social Connection and Isolation Panel [NHANES]   . Frequency of Communication with Friends and Family: More than  three times a week   . Frequency of Social Gatherings with Friends and Family: More than three times a week   . Attends Religious Services: More than 4 times per year   . Active Member of Clubs or Organizations: No   . Attends Banker Meetings: Never   . Marital Status: Widowed  Intimate Partner Violence: Not At Risk (05/28/2021)   Humiliation, Afraid, Rape, and Kick questionnaire   . Fear of Current or Ex-Partner: No   . Emotionally Abused: No   . Physically Abused: No   . Sexually Abused: No    Outpatient Medications Prior to Visit  Medication Sig Dispense  Refill  . aspirin 81 MG tablet Take 1 tablet (81 mg total) by mouth daily. Resume 4 days post-op 30 tablet   . Calcium Carbonate-Vitamin D 600-400 MG-UNIT tablet Take 1 tablet by mouth 2 (two) times daily.    . carvedilol (COREG) 12.5 MG tablet Take 1 tablet (12.5 mg total) by mouth daily. 90 tablet 1  . Cyanocobalamin (VITAMIN B-12 CR) 1500 MCG TBCR Take by mouth daily.    . Ferrous Sulfate (IRON PO) Take by mouth daily.    Marland Kitchen FIBER ADULT GUMMIES PO Take 2 each by mouth.    . hydrochlorothiazide (HYDRODIURIL) 25 MG tablet Take 1 tablet (25 mg total) by mouth daily. 90 tablet 0  . Misc Natural Products (OSTEO BI-FLEX ADV JOINT SHIELD) TABS Take 1 tablet by mouth daily. Resume 5 days post-op    . Multiple Vitamins-Minerals (MULTIVITAMIN WITH MINERALS) tablet Take 1 tablet by mouth daily. Resume 5 days post-op    . Omega-3 Fatty Acids (FISH OIL) 1000 MG CAPS Take by mouth.    . Probiotic Product (PROBIOTIC DAILY PO) Take by mouth.    . simvastatin (ZOCOR) 40 MG tablet Take 40 mg by mouth daily at 6 PM.    . Turmeric 500 MG CAPS Take 1 capsule by mouth daily. Resume 5 days post-op as needed    . vitamin E 400 UNIT capsule Take 1 capsule (400 Units total) by mouth daily. Resume 5 days post-op     No facility-administered medications prior to visit.    No Known Allergies  Review of Systems  Constitutional:  Positive for malaise/fatigue. Negative for fever.  HENT:  Negative for congestion.   Eyes:  Negative for blurred vision.  Respiratory:  Negative for shortness of breath.   Cardiovascular:  Negative for chest pain, palpitations and leg swelling.  Gastrointestinal:  Positive for constipation. Negative for abdominal pain, blood in stool and nausea.  Genitourinary:  Negative for dysuria and frequency.  Musculoskeletal:  Negative for falls.  Skin:  Negative for rash.  Neurological:  Negative for dizziness, loss of consciousness and headaches.  Endo/Heme/Allergies:  Negative for environmental  allergies.  Psychiatric/Behavioral:  Negative for depression. The patient is not nervous/anxious.        Objective:    Physical Exam Constitutional:      General: She is not in acute distress.    Appearance: Normal appearance. She is well-developed. She is not toxic-appearing or diaphoretic.  HENT:     Head: Normocephalic and atraumatic.     Right Ear: Tympanic membrane, ear canal and external ear normal.     Left Ear: Tympanic membrane, ear canal and external ear normal.     Nose: Nose normal.     Mouth/Throat:     Mouth: Mucous membranes are moist.     Pharynx: Oropharynx is clear. No oropharyngeal  exudate.  Eyes:     General: No scleral icterus.       Right eye: No discharge.        Left eye: No discharge.     Conjunctiva/sclera: Conjunctivae normal.     Pupils: Pupils are equal, round, and reactive to light.  Neck:     Thyroid: No thyromegaly.  Cardiovascular:     Rate and Rhythm: Normal rate and regular rhythm.     Heart sounds: Normal heart sounds. No murmur heard. Pulmonary:     Effort: Pulmonary effort is normal. No respiratory distress.     Breath sounds: Normal breath sounds. No wheezing or rales.  Abdominal:     General: Bowel sounds are normal. There is no distension.     Palpations: Abdomen is soft. There is no mass.     Tenderness: There is no abdominal tenderness. There is no guarding.  Musculoskeletal:        General: No tenderness. Normal range of motion.     Cervical back: Normal range of motion and neck supple.  Lymphadenopathy:     Cervical: No cervical adenopathy.  Skin:    General: Skin is warm and dry.     Findings: No rash.  Neurological:     General: No focal deficit present.     Mental Status: She is alert and oriented to person, place, and time.     Cranial Nerves: No cranial nerve deficit.     Coordination: Coordination normal.     Deep Tendon Reflexes: Reflexes are normal and symmetric. Reflexes normal.  Psychiatric:        Mood and  Affect: Mood normal.        Behavior: Behavior normal.        Thought Content: Thought content normal.        Judgment: Judgment normal.    BP 112/70 (BP Location: Left Arm, Patient Position: Sitting, Cuff Size: Normal)   Pulse 92   Temp 97.8 F (36.6 C) (Oral)   Resp 16   Ht 4\' 9"  (1.448 m)   Wt 96 lb (43.5 kg)   SpO2 100%   BMI 20.77 kg/m  Wt Readings from Last 3 Encounters:  03/13/23 96 lb (43.5 kg)  12/05/22 98 lb 6.4 oz (44.6 kg)  08/08/22 100 lb 6.4 oz (45.5 kg)    Diabetic Foot Exam - Simple   No data filed    Lab Results  Component Value Date   WBC 4.5 03/13/2023   HGB 12.7 03/13/2023   HCT 39.4 03/13/2023   PLT 215.0 03/13/2023   GLUCOSE 98 03/13/2023   CHOL 226 (H) 03/13/2023   TRIG 86.0 03/13/2023   HDL 97.50 03/13/2023   LDLCALC 111 (H) 03/13/2023   ALT 12 03/13/2023   AST 16 03/13/2023   NA 137 03/13/2023   K 3.9 03/13/2023   CL 99 03/13/2023   CREATININE 0.88 03/13/2023   BUN 37 (H) 03/13/2023   CO2 30 03/13/2023   TSH 1.90 03/13/2023   HGBA1C 5.7 03/13/2023    Lab Results  Component Value Date   TSH 1.90 03/13/2023   Lab Results  Component Value Date   WBC 4.5 03/13/2023   HGB 12.7 03/13/2023   HCT 39.4 03/13/2023   MCV 90.9 03/13/2023   PLT 215.0 03/13/2023   Lab Results  Component Value Date   NA 137 03/13/2023   K 3.9 03/13/2023   CO2 30 03/13/2023   GLUCOSE 98 03/13/2023   BUN 37 (H) 03/13/2023  CREATININE 0.88 03/13/2023   BILITOT 0.6 03/13/2023   ALKPHOS 49 03/13/2023   AST 16 03/13/2023   ALT 12 03/13/2023   PROT 6.3 03/13/2023   ALBUMIN 4.1 03/13/2023   CALCIUM 9.3 03/13/2023   ANIONGAP 7 09/12/2016   GFR 58.19 (L) 03/13/2023   Lab Results  Component Value Date   CHOL 226 (H) 03/13/2023   Lab Results  Component Value Date   HDL 97.50 03/13/2023   Lab Results  Component Value Date   LDLCALC 111 (H) 03/13/2023   Lab Results  Component Value Date   TRIG 86.0 03/13/2023   Lab Results  Component Value  Date   CHOLHDL 2 03/13/2023   Lab Results  Component Value Date   HGBA1C 5.7 03/13/2023       Assessment & Plan:  Essential hypertension Assessment & Plan: Well controlled, no changes to meds. Encouraged heart healthy diet such as the DASH diet and exercise as tolerated.   Orders: -     Comprehensive metabolic panel -     CBC with Differential/Platelet -     TSH  Hyperglycemia Assessment & Plan: hgba1c acceptable, minimize simple carbs. Increase exercise as tolerated.   Orders: -     Hemoglobin A1c  Hyperlipidemia, mixed Assessment & Plan: Encouraged heart healthy diet, increase exercise, avoid trans fats, consider a krill oil cap daily  Orders: -     Lipid panel  Osteoporosis, unspecified osteoporosis type, unspecified pathological fracture presence Assessment & Plan: Encouraged to get adequate exercise, calcium and vitamin d intake consider repeat Dexa scan next year.    Chronic bilateral low back pain with bilateral sciatica Assessment & Plan: Struggles with pain daily but stays active   Vitamin D deficiency Assessment & Plan: Supplement and monitor   Orders: -     VITAMIN D 25 Hydroxy (Vit-D Deficiency, Fractures)  Vitamin B12 deficiency -     Vitamin B12    Assessment and Plan    Decreased Energy with Aging Gradual decrease in energy levels despite maintaining high activity level (10,000 steps daily). Discussed the normal aging process and the need for rest and recovery. -Continue current level of activity, but listen to body and rest as needed. -Ensure adequate protein intake for muscle health.  Bone Health Last bone scan in 2023, currently on Prolia with no reported issues. -Continue Prolia. -Plan for next bone scan in 2025.  General Health Maintenance -Resume multivitamin with minerals daily. -Check B12, sugar, thyroid, and Vitamin D levels today. -Consider COVID booster and flu shot when offered by the facility. -Encourage light arm  exercises with 1lb weights for spine health.  Follow-up in 6 months or sooner if needed.         Danise Edge, MD

## 2023-05-06 DIAGNOSIS — Z23 Encounter for immunization: Secondary | ICD-10-CM | POA: Diagnosis not present

## 2023-05-07 ENCOUNTER — Other Ambulatory Visit: Payer: Self-pay | Admitting: Family Medicine

## 2023-05-30 ENCOUNTER — Telehealth: Payer: Self-pay | Admitting: Family Medicine

## 2023-05-30 NOTE — Telephone Encounter (Signed)
Copied from CRM [id number]. Topic: Medicare AWV >> May 30, 2023 10:17 AM Payton Doughty wrote: Reason for CRM: Called LVM 05/30/2023 to schedule Annual Wellness Visit  Verlee Rossetti; Care Guide Ambulatory Clinical Support [place] [facility] Direct Dial: [phone]

## 2023-06-18 ENCOUNTER — Ambulatory Visit: Payer: Medicare Other | Admitting: *Deleted

## 2023-06-18 VITALS — BP 149/63 | HR 94 | Ht <= 58 in | Wt 100.2 lb

## 2023-06-18 DIAGNOSIS — Z0000 Encounter for general adult medical examination without abnormal findings: Secondary | ICD-10-CM | POA: Diagnosis not present

## 2023-06-18 NOTE — Patient Instructions (Signed)
Jennifer Wilkins , Thank you for taking time to come for your Medicare Wellness Visit. I appreciate your ongoing commitment to your health goals. Please review the following plan we discussed and let me know if I can assist you in the future.   These are the goals we discussed:  Goals      DIET - INCREASE WATER INTAKE     Maintain current lifestyle.        This is a list of the screening recommended for you and due dates:  Health Maintenance  Topic Date Due   COVID-19 Vaccine (7 - 2023-24 season) 03/30/2023   DTaP/Tdap/Td vaccine (2 - Td or Tdap) 06/15/2023   Medicare Annual Wellness Visit  06/17/2024   Pneumonia Vaccine  Completed   Flu Shot  Completed   DEXA scan (bone density measurement)  Completed   Zoster (Shingles) Vaccine  Completed   HPV Vaccine  Aged Out     Next appointment: Follow up in one year for your annual wellness visit.   Preventive Care 27 Years and Older, Female Preventive care refers to lifestyle choices and visits with your health care provider that can promote health and wellness. What does preventive care include? A yearly physical exam. This is also called an annual well check. Dental exams once or twice a year. Routine eye exams. Ask your health care provider how often you should have your eyes checked. Personal lifestyle choices, including: Daily care of your teeth and gums. Regular physical activity. Eating a healthy diet. Avoiding tobacco and drug use. Limiting alcohol use. Practicing safe sex. Taking low-dose aspirin every day. Taking vitamin and mineral supplements as recommended by your health care provider. What happens during an annual well check? The services and screenings done by your health care provider during your annual well check will depend on your age, overall health, lifestyle risk factors, and family history of disease. Counseling  Your health care provider may ask you questions about your: Alcohol use. Tobacco use. Drug  use. Emotional well-being. Home and relationship well-being. Sexual activity. Eating habits. History of falls. Memory and ability to understand (cognition). Work and work Astronomer. Reproductive health. Screening  You may have the following tests or measurements: Height, weight, and BMI. Blood pressure. Lipid and cholesterol levels. These may be checked every 5 years, or more frequently if you are over 87 years old. Skin check. Lung cancer screening. You may have this screening every year starting at age 87 if you have a 30-pack-year history of smoking and currently smoke or have quit within the past 15 years. Fecal occult blood test (FOBT) of the stool. You may have this test every year starting at age 87. Flexible sigmoidoscopy or colonoscopy. You may have a sigmoidoscopy every 5 years or a colonoscopy every 10 years starting at age 87. Hepatitis C blood test. Hepatitis B blood test. Sexually transmitted disease (STD) testing. Diabetes screening. This is done by checking your blood sugar (glucose) after you have not eaten for a while (fasting). You may have this done every 1-3 years. Bone density scan. This is done to screen for osteoporosis. You may have this done starting at age 87. Mammogram. This may be done every 1-2 years. Talk to your health care provider about how often you should have regular mammograms. Talk with your health care provider about your test results, treatment options, and if necessary, the need for more tests. Vaccines  Your health care provider may recommend certain vaccines, such as: Influenza vaccine. This  is recommended every year. Tetanus, diphtheria, and acellular pertussis (Tdap, Td) vaccine. You may need a Td booster every 10 years. Zoster vaccine. You may need this after age 87. Pneumococcal 13-valent conjugate (PCV13) vaccine. One dose is recommended after age 87. Pneumococcal polysaccharide (PPSV23) vaccine. One dose is recommended after age  87. Talk to your health care provider about which screenings and vaccines you need and how often you need them. This information is not intended to replace advice given to you by your health care provider. Make sure you discuss any questions you have with your health care provider. Document Released: 08/11/2015 Document Revised: 04/03/2016 Document Reviewed: 05/16/2015 Elsevier Interactive Patient Education  2017 [employer].  Fall Prevention in the Home Falls can cause injuries. They can happen to people of all ages. There are many things you can do to make your home safe and to help prevent falls. What can I do on the outside of my home? Regularly fix the edges of walkways and driveways and fix any cracks. Remove anything that might make you trip as you walk through a door, such as a raised step or threshold. Trim any bushes or trees on the path to your home. Use bright outdoor lighting. Clear any walking paths of anything that might make someone trip, such as rocks or tools. Regularly check to see if handrails are loose or broken. Make sure that both sides of any steps have handrails. Any raised decks and porches should have guardrails on the edges. Have any leaves, snow, or ice cleared regularly. Use sand or salt on walking paths during winter. Clean up any spills in your garage right away. This includes oil or grease spills. What can I do in the bathroom? Use night lights. Install grab bars by the toilet and in the tub and shower. Do not use towel bars as grab bars. Use non-skid mats or decals in the tub or shower. If you need to sit down in the shower, use a plastic, non-slip stool. Keep the floor dry. Clean up any water that spills on the floor as soon as it happens. Remove soap buildup in the tub or shower regularly. Attach bath mats securely with double-sided non-slip rug tape. Do not have throw rugs and other things on the floor that can make you trip. What can I do in the  bedroom? Use night lights. Make sure that you have a light by your bed that is easy to reach. Do not use any sheets or blankets that are too big for your bed. They should not hang down onto the floor. Have a firm chair that has side arms. You can use this for support while you get dressed. Do not have throw rugs and other things on the floor that can make you trip. What can I do in the kitchen? Clean up any spills right away. Avoid walking on wet floors. Keep items that you use a lot in easy-to-reach places. If you need to reach something above you, use a strong step stool that has a grab bar. Keep electrical cords out of the way. Do not use floor polish or wax that makes floors slippery. If you must use wax, use non-skid floor wax. Do not have throw rugs and other things on the floor that can make you trip. What can I do with my stairs? Do not leave any items on the stairs. Make sure that there are handrails on both sides of the stairs and use them. Fix handrails that  are broken or loose. Make sure that handrails are as long as the stairways. Check any carpeting to make sure that it is firmly attached to the stairs. Fix any carpet that is loose or worn. Avoid having throw rugs at the top or bottom of the stairs. If you do have throw rugs, attach them to the floor with carpet tape. Make sure that you have a light switch at the top of the stairs and the bottom of the stairs. If you do not have them, ask someone to add them for you. What else can I do to help prevent falls? Wear shoes that: Do not have high heels. Have rubber bottoms. Are comfortable and fit you well. Are closed at the toe. Do not wear sandals. If you use a stepladder: Make sure that it is fully opened. Do not climb a closed stepladder. Make sure that both sides of the stepladder are locked into place. Ask someone to hold it for you, if possible. Clearly mark and make sure that you can see: Any grab bars or  handrails. First and last steps. Where the edge of each step is. Use tools that help you move around (mobility aids) if they are needed. These include: Canes. Walkers. Scooters. Crutches. Turn on the lights when you go into a dark area. Replace any light bulbs as soon as they burn out. Set up your furniture so you have a clear path. Avoid moving your furniture around. If any of your floors are uneven, fix them. If there are any pets around you, be aware of where they are. Review your medicines with your doctor. Some medicines can make you feel dizzy. This can increase your chance of falling. Ask your doctor what other things that you can do to help prevent falls. This information is not intended to replace advice given to you by your health care provider. Make sure you discuss any questions you have with your health care provider. Document Released: 05/11/2009 Document Revised: 12/21/2015 Document Reviewed: 08/19/2014 Elsevier Interactive Patient Education  2017 [employer].

## 2023-06-18 NOTE — Progress Notes (Signed)
Subjective:   Jennifer Wilkins is a 89 y.o. female who presents for Medicare Annual (Subsequent) preventive examination.  Visit Complete: In person   Cardiac Risk Factors include: advanced age (>49men, >59 women);dyslipidemia;hypertension     Objective:    Today's Vitals   06/18/23 0857 06/18/23 0918  BP: (!) 150/63 (!) 149/63  Pulse: 94 94  Weight: 100 lb 3.2 oz (45.5 kg)   Height: 4\' 9"  (1.448 m)    Body mass index is 21.68 kg/m.     06/18/2023    9:15 AM 06/03/2022    2:23 PM 05/28/2021    2:38 PM 04/04/2020   10:57 AM 04/01/2019    2:36 PM 01/02/2018    2:17 PM 12/30/2016   11:44 AM  Advanced Directives  Does Patient Have a Medical Advance Directive? Yes Yes Yes Yes Yes Yes Yes  Type of Estate agent of [place];Living will Healthcare Power of [place];Living will Healthcare Power of [place];Living will Out of facility DNR (pink MOST or yellow form) Out of facility DNR (pink MOST or yellow form) Healthcare Power of [place];Living will Healthcare Power of [place];Living will  Does patient want to make changes to medical advance directive? No - Patient declined No - Patient declined  No - Patient declined     Copy of Healthcare Power of Attorney in Chart? No - copy requested No - copy requested No - copy requested   Yes No - copy requested    Current Medications (verified) Outpatient Encounter Medications as of 06/18/2023  Medication Sig   aspirin 81 MG tablet Take 1 tablet (81 mg total) by mouth daily. Resume 4 days post-op   Calcium Carbonate-Vitamin D 600-400 MG-UNIT tablet Take 1 tablet by mouth 2 (two) times daily.   carvedilol (COREG) 12.5 MG tablet Take 1 tablet (12.5 mg total) by mouth daily.   Cyanocobalamin (VITAMIN B-12 CR) 1500 MCG TBCR Take by mouth daily.   Ferrous Sulfate (IRON PO) Take by mouth daily.   FIBER ADULT GUMMIES PO Take 2 each by mouth.   hydrochlorothiazide (HYDRODIURIL) 25 MG tablet Take 1 tablet (25 mg total) by mouth daily.    Misc Natural Products (OSTEO BI-FLEX ADV JOINT SHIELD) TABS Take 1 tablet by mouth daily. Resume 5 days post-op   Multiple Vitamins-Minerals (MULTIVITAMIN WITH MINERALS) tablet Take 1 tablet by mouth daily. Resume 5 days post-op   Omega-3 Fatty Acids (FISH OIL) 1000 MG CAPS Take by mouth.   Probiotic Product (PROBIOTIC DAILY PO) Take by mouth.   Turmeric 500 MG CAPS Take 1 capsule by mouth daily. Resume 5 days post-op as needed   vitamin E 400 UNIT capsule Take 1 capsule (400 Units total) by mouth daily. Resume 5 days post-op   simvastatin (ZOCOR) 40 MG tablet Take 40 mg by mouth daily at 6 PM. (Patient not taking: Reported on 06/18/2023)   No facility-administered encounter medications on file as of 06/18/2023.    Allergies (verified) Patient has no known allergies.   History: Past Medical History:  Diagnosis Date   Anemia 09/30/2016   Arthritis    Elevated BP 04/21/2014   HTN (hypertension)    Hypercholesteremia    Hypocalcemia 06/19/2013   Low back pain radiating to both legs 08/27/2015   Medicare annual wellness visit, subsequent 05/03/2015   Preventative health care 06/02/2016   Stress reaction 04/25/2015   Past Surgical History:  Procedure Laterality Date   CHOLECYSTECTOMY     GALLBLADDER SURGERY  1984   Gallstone removal  LUMBAR LAMINECTOMY/DECOMPRESSION MICRODISCECTOMY N/A 09/11/2016   Procedure: Microlumbar Decompression L3-4 and L4-5, lateral mass fusion at L4-5 using autologous, autograft bone;  Surgeon: Jene Every, MD;  Location: [facility];  Service: Orthopedics;  Laterality: N/A;   Family History  Problem Relation Age of Onset   Osteoporosis Mother    Ulcers Father    Kidney disease Paternal Grandfather    Social History   Socioeconomic History   Marital status: Widowed    Spouse name: Not on file   Number of children: Not on file   Years of education: Not on file   Highest education level: Not on file  Occupational History   Not on file  Tobacco Use    Smoking status: Never   Smokeless tobacco: Never  Substance and Sexual Activity   Alcohol use: No   Drug use: No   Sexual activity: Not Currently  Other Topics Concern   Not on file  Social History Narrative   Widowed July 2017   Social Determinants of Health   Financial Resource Strain: Low Risk  (06/18/2023)   Overall Financial Resource Strain (CARDIA)    Difficulty of Paying Living Expenses: Not hard at all  Food Insecurity: No Food Insecurity (06/18/2023)   Hunger Vital Sign    Worried About Running Out of Food in the Last Year: Never true    Ran Out of Food in the Last Year: Never true  Transportation Needs: No Transportation Needs (05/28/2021)   PRAPARE - Administrator, Civil Service (Medical): No    Lack of Transportation (Non-Medical): No  Physical Activity: Inactive (06/18/2023)   Exercise Vital Sign    Days of Exercise per Week: 0 days    Minutes of Exercise per Session: 0 min  Stress: No Stress Concern Present (06/18/2023)   [employer] of Occupational Health - Occupational Stress Questionnaire    Feeling of Stress : Not at all  Social Connections: Moderately Isolated (06/18/2023)   Social Connection and Isolation Panel [NHANES]    Frequency of Communication with Friends and Family: Three times a week    Frequency of Social Gatherings with Friends and Family: Three times a week    Attends Religious Services: More than 4 times per year    Active Member of Clubs or Organizations: No    Attends Banker Meetings: Never    Marital Status: Widowed    Tobacco Counseling Counseling given: Not Answered   Clinical Intake:  Pre-visit preparation completed: Yes  Pain : No/denies pain     BMI - recorded: 21.68 Nutritional Status: BMI of 19-24  Normal Nutritional Risks: None Diabetes: No  How often do you need to have someone help you when you read instructions, pamphlets, or other written materials from your doctor or pharmacy?: 1  - Never  Interpreter Needed?: No  Information entered by :: Donne Anon, CMA   Activities of Daily Living    06/18/2023    9:09 AM  In your present state of health, do you have any difficulty performing the following activities:  Hearing? 1  Comment wears hearing aids sometimes  Vision? 0  Difficulty concentrating or making decisions? 1  Walking or climbing stairs? 1  Dressing or bathing? 0  Doing errands, shopping? 1  Preparing Food and eating ? N  Using the Toilet? N  In the past six months, have you accidently leaked urine? N  Do you have problems with loss of bowel control? N  Managing your Medications? N  Managing your Finances? N  Housekeeping or managing your Housekeeping? N    Patient Care Team: Bradd Canary, MD as PCP - General (Family Medicine) Blima Ledger, OD (Optometry)  Indicate any recent Medical Services you may have received from other than [facility] providers in the past year (date may be approximate).     Assessment:   This is a routine wellness examination for [place].  Hearing/Vision screen No results found.   Goals Addressed   None    Depression Screen    06/18/2023    9:14 AM 03/13/2023    9:36 AM 12/05/2022    9:15 AM 08/08/2022    9:42 AM 06/03/2022    2:23 PM 05/28/2021    2:44 PM 12/26/2020   11:20 AM  PHQ 2/9 Scores  PHQ - 2 Score 0 0 0 0 0 0 0  PHQ- 9 Score   0        Fall Risk    06/18/2023    9:11 AM 03/13/2023    9:36 AM 12/05/2022    9:14 AM 08/08/2022    9:42 AM 06/03/2022    2:23 PM  Fall Risk   Falls in the past year? 0 0 0 0 0  Number falls in past yr: 0 0 0 0 0  Injury with Fall? 0 0 0 0 0  Risk for fall due to : No Fall Risks    No Fall Risks  Follow up Falls evaluation completed Falls evaluation completed Falls evaluation completed Falls evaluation completed Falls evaluation completed    MEDICARE RISK AT HOME: Medicare Risk at Home Any stairs in or around the home?: Yes If so, are there any without handrails?:  No Home free of loose throw rugs in walkways, pet beds, electrical cords, etc?: Yes Adequate lighting in your home to reduce risk of falls?: Yes Life alert?: No Use of a cane, walker or w/c?: Yes Grab bars in the bathroom?: Yes Shower chair or bench in shower?: Yes Elevated toilet seat or a handicapped toilet?: Yes  TIMED UP AND GO:  Was the test performed?  Yes  Length of time to ambulate 10 feet: 8 sec Gait slow and steady with assistive device    Cognitive Function:    01/02/2018    2:29 PM 12/30/2016   11:32 AM  MMSE - Mini Mental State Exam  Orientation to time 5 5  Orientation to Place 5 5  Registration 3 3  Attention/ Calculation 5 5  Recall 3 3  Language- name 2 objects 2 2  Language- repeat 1 1  Language- follow 3 step command 3 3  Language- read & follow direction 1 1  Write a sentence 1 1  Copy design 1 1  Total score 30 30        06/18/2023    9:16 AM 06/03/2022    2:37 PM 04/01/2019    2:45 PM  6CIT Screen  What Year? 0 points 0 points   What month? 0 points 0 points   What time? 0 points 0 points 0 points  Count back from 20 0 points 0 points 0 points  Months in reverse 0 points 0 points 0 points  Repeat phrase 0 points 0 points 0 points  Total Score 0 points 0 points     Immunizations Immunization History  Administered Date(s) Administered   Fluad Quad(high Dose 65+) 05/11/2019   Influenza Split 04/24/2012   Influenza Whole 06/11/2002, 06/17/2003   Influenza, High Dose Seasonal PF  04/25/2015, 05/02/2017, 05/01/2020, 05/21/2021, 05/20/2022   Influenza,inj,Quad PF,6+ Mos 04/21/2014, 05/02/2017   Influenza,inj,quad, With Preservative 03/29/2017   Influenza-Unspecified 04/24/2012, 04/28/2013, 07/29/2018   Moderna Covid-19 Fall Seasonal Vaccine 52yrs & older 06/05/2022   Moderna Covid-19 Vaccine Bivalent Booster 39yrs & up 04/23/2021   Moderna Sars-Covid-2 Vaccination 08/10/2019, 09/07/2019, 05/31/2020, 11/15/2020   Pneumococcal Conjugate-13  06/14/2013, 10/14/2014   Pneumococcal Polysaccharide-23 05/30/2016   Tdap 06/14/2013   Zoster Recombinant(Shingrix) 11/12/2017, 03/24/2018   Zoster, Live 07/29/2014    TDAP status: Due, Education has been provided regarding the importance of this vaccine. Advised may receive this vaccine at local pharmacy or Health Dept. Aware to provide a copy of the vaccination record if obtained from local pharmacy or Health Dept. Verbalized acceptance and understanding.  Flu Vaccine status: Up to date  Pneumococcal vaccine status: Up to date  Covid-19 vaccine status: Information provided on how to obtain vaccines.   Qualifies for Shingles Vaccine? Yes   Zostavax completed Yes   Shingrix Completed?: Yes  Screening Tests Health Maintenance  Topic Date Due   COVID-19 Vaccine (7 - 2023-24 season) 03/30/2023   Medicare Annual Wellness (AWV)  06/04/2023   DTaP/Tdap/Td (2 - Td or Tdap) 06/15/2023   Pneumonia Vaccine 99+ Years old  Completed   INFLUENZA VACCINE  Completed   DEXA SCAN  Completed   Zoster Vaccines- Shingrix  Completed   HPV VACCINES  Aged Out    Health Maintenance  Health Maintenance Due  Topic Date Due   COVID-19 Vaccine (7 - 2023-24 season) 03/30/2023   Medicare Annual Wellness (AWV)  06/04/2023   DTaP/Tdap/Td (2 - Td or Tdap) 06/15/2023    Colorectal cancer screening: No longer required.   Mammogram status: No longer required due to pt declines.  Bone Density status: Completed 10/18/21. Results reflect: Bone density results: OSTEOPOROSIS. Repeat every 2 years.  Lung Cancer Screening: (Low Dose CT Chest recommended if Age 80-80 years, 20 pack-year currently smoking OR have quit w/in 15years.) does not qualify.   Additional Screening:  Hepatitis C Screening: does not qualify  Vision Screening: Recommended annual ophthalmology exams for early detection of glaucoma and other disorders of the eye. Is the patient up to date with their annual eye exam?  No  Who is the  provider or what is the name of the office in which the patient attends annual eye exams? Dr. Blima Ledger If pt is not established with a provider, would they like to be referred to a provider to establish care? No .   Dental Screening: Recommended annual dental exams for proper oral hygiene  Diabetic Foot Exam: N/a  Community Resource Referral / Chronic Care Management: CRR required this visit?  No   CCM required this visit?  No     Plan:     I have personally reviewed and noted the following in the patient's chart:   Medical and social history Use of alcohol, tobacco or illicit drugs  Current medications and supplements including opioid prescriptions. Patient is not currently taking opioid prescriptions. Functional ability and status Nutritional status Physical activity Advanced directives List of other physicians Hospitalizations, surgeries, and ER visits in previous 12 months Vitals Screenings to include cognitive, depression, and falls Referrals and appointments  In addition, I have reviewed and discussed with patient certain preventive protocols, quality metrics, and best practice recommendations. A written personalized care plan for preventive services as well as general preventive health recommendations were provided to patient.     Donne Anon, CMA  06/18/2023   After Visit Summary: (In Person-Declined) Patient declined AVS at this time.  Nurse Notes: None

## 2023-07-25 ENCOUNTER — Other Ambulatory Visit: Payer: Self-pay | Admitting: Family Medicine

## 2023-07-25 MED ORDER — DENOSUMAB 60 MG/ML ~~LOC~~ SOSY: 60.0000 mg | PREFILLED_SYRINGE | Freq: Once | SUBCUTANEOUS | Status: DC

## 2023-07-25 NOTE — Addendum Note (Signed)
Addended by: Mertha Finders on: 07/25/2023 02:45 PM   Modules accepted: Orders

## 2023-07-25 NOTE — Telephone Encounter (Signed)
Prolia was done 02/19/23 Next is due 08/22/23 CAM placed. Please check PA/Cost.

## 2023-07-31 ENCOUNTER — Telehealth: Payer: Self-pay

## 2023-07-31 NOTE — Telephone Encounter (Signed)
 Prolia VOB initiated via [email or web address]  Next Prolia inj DUE: 08/22/23

## 2023-08-01 ENCOUNTER — Other Ambulatory Visit (HOSPITAL_COMMUNITY): Payer: Self-pay

## 2023-08-01 NOTE — Telephone Encounter (Signed)
 Pt ready for scheduling for Prolia  on or after : 08/01/23  Out-of-pocket cost due at time of visit: $0  Number of injection/visits approved: -  Primary: [place]  - Medicare Prolia  co-insurance: 20% Admin fee co-insurance: 20%  Secondary: Tricare for Life - MEDSUP Prolia  co-insurance: 100% Admin fee co-insurance: 100%  Medical Benefit Details: Date Benefits were checked: 07/31/23 Deductible: $257(secondary will consider deductible)/ Coinsurance: 100%/ Admin Fee: 100%  Prior Auth: not required PA# Expiration Date:   # of doses approved:  Pharmacy benefit: Copay $-- If patient wants fill through the pharmacy benefit please send prescription to:  -- , and include estimated need by date in rx notes. Pharmacy will ship medication directly to the office.  Patient not eligible for Prolia  Copay Card. Copay Card can make patient's cost as little as $25. Link to apply: https://www.amgensupportplus.com/copay  ** This summary of benefits is an estimation of the patient's out-of-pocket cost. Exact cost may very based on individual plan coverage.

## 2023-08-01 NOTE — Telephone Encounter (Signed)
 Marland Kitchen

## 2023-08-04 NOTE — Telephone Encounter (Signed)
 Disregard message please.   I will call and set this up.

## 2023-08-04 NOTE — Telephone Encounter (Signed)
 Message appears blank on my end.

## 2023-08-04 NOTE — Telephone Encounter (Signed)
 Left message on machine to call back to schedule for Prolia

## 2023-08-05 NOTE — Telephone Encounter (Signed)
 Appt scheduled

## 2023-08-07 ENCOUNTER — Telehealth: Payer: Self-pay

## 2023-08-07 ENCOUNTER — Ambulatory Visit (INDEPENDENT_AMBULATORY_CARE_PROVIDER_SITE_OTHER): Payer: Medicare Other

## 2023-08-07 DIAGNOSIS — M810 Age-related osteoporosis without current pathological fracture: Secondary | ICD-10-CM | POA: Diagnosis not present

## 2023-08-07 MED ORDER — DENOSUMAB 60 MG/ML ~~LOC~~ SOSY
60.0000 mg | PREFILLED_SYRINGE | Freq: Once | SUBCUTANEOUS | Status: AC
  Administered 2024-02-09: 60 mg via SUBCUTANEOUS

## 2023-08-07 MED ORDER — DENOSUMAB 60 MG/ML ~~LOC~~ SOSY
60.0000 mg | PREFILLED_SYRINGE | Freq: Once | SUBCUTANEOUS | Status: AC
  Administered 2023-08-07: 60 mg via SUBCUTANEOUS

## 2023-08-07 NOTE — Progress Notes (Signed)
 Pt here today for Prolia injection.  Prolia 60mg /mL injected subcutaneous into L arm. Pt tolerated injection well.    Next in 6 months.

## 2023-08-07 NOTE — Telephone Encounter (Signed)
 Prolia was done 08/07/23 Next is due 02/04/24 CAM placed for next injection. (For some reason the last CAM was closed, so a new order had to be placed to admin the injection).

## 2023-08-07 NOTE — Telephone Encounter (Signed)
-----   Message from [facility] sent at 08/07/2023  8:49 AM EST ----- Regarding: Prolia Pt received Prolia today, 08/07/23.   KDC

## 2023-08-08 NOTE — Telephone Encounter (Signed)
 Received this dose. Next cam has been placed in new TE.

## 2023-09-11 DIAGNOSIS — I10 Essential (primary) hypertension: Secondary | ICD-10-CM | POA: Diagnosis not present

## 2023-09-11 DIAGNOSIS — H35033 Hypertensive retinopathy, bilateral: Secondary | ICD-10-CM | POA: Diagnosis not present

## 2023-09-14 NOTE — Assessment & Plan Note (Signed)
 Supplement and monitor

## 2023-09-14 NOTE — Assessment & Plan Note (Signed)
 [id number] acceptable, minimize simple carbs. Increase exercise as tolerated.

## 2023-09-14 NOTE — Assessment & Plan Note (Signed)
Encouraged to get adequate exercise, calcium and vitamin d intake consider repeat Dexa scan

## 2023-09-14 NOTE — Assessment & Plan Note (Signed)
 Encouraged heart healthy diet, increase exercise, avoid trans fats, consider a krill oil cap daily

## 2023-09-14 NOTE — Assessment & Plan Note (Signed)
 Well controlled, no changes to meds. Encouraged heart healthy diet such as the DASH diet and exercise as tolerated.

## 2023-09-15 ENCOUNTER — Ambulatory Visit (INDEPENDENT_AMBULATORY_CARE_PROVIDER_SITE_OTHER): Payer: Medicare Other | Admitting: Family Medicine

## 2023-09-15 VITALS — BP 122/82 | HR 85 | Temp 97.6°F | Resp 18 | Ht <= 58 in | Wt 96.2 lb

## 2023-09-15 DIAGNOSIS — R739 Hyperglycemia, unspecified: Secondary | ICD-10-CM

## 2023-09-15 DIAGNOSIS — E559 Vitamin D deficiency, unspecified: Secondary | ICD-10-CM

## 2023-09-15 DIAGNOSIS — I10 Essential (primary) hypertension: Secondary | ICD-10-CM

## 2023-09-15 DIAGNOSIS — E782 Mixed hyperlipidemia: Secondary | ICD-10-CM

## 2023-09-15 DIAGNOSIS — M810 Age-related osteoporosis without current pathological fracture: Secondary | ICD-10-CM

## 2023-09-15 DIAGNOSIS — M25571 Pain in right ankle and joints of right foot: Secondary | ICD-10-CM

## 2023-09-15 LAB — CBC WITH DIFFERENTIAL/PLATELET
Basophils Absolute: 0 10*3/uL (ref 0.0–0.1)
Basophils Relative: 0.6 % (ref 0.0–3.0)
Eosinophils Absolute: 0.1 10*3/uL (ref 0.0–0.7)
Eosinophils Relative: 1.6 % (ref 0.0–5.0)
HCT: 37.2 % (ref 36.0–46.0)
Hemoglobin: 12.3 g/dL (ref 12.0–15.0)
Lymphocytes Relative: 24.6 % (ref 12.0–46.0)
Lymphs Abs: 1.3 10*3/uL (ref 0.7–4.0)
MCHC: 33 g/dL (ref 30.0–36.0)
MCV: 93 fL (ref 78.0–100.0)
Monocytes Absolute: 0.5 10*3/uL (ref 0.1–1.0)
Monocytes Relative: 9.8 % (ref 3.0–12.0)
Neutro Abs: 3.3 10*3/uL (ref 1.4–7.7)
Neutrophils Relative %: 63.4 % (ref 43.0–77.0)
Platelets: 242 10*3/uL (ref 150.0–400.0)
RBC: 4 Mil/uL (ref 3.87–5.11)
RDW: 14.1 % (ref 11.5–15.5)
WBC: 5.2 10*3/uL (ref 4.0–10.5)

## 2023-09-15 LAB — COMPREHENSIVE METABOLIC PANEL
ALT: 13 U/L (ref 0–35)
AST: 18 U/L (ref 0–37)
Albumin: 4 g/dL (ref 3.5–5.2)
Alkaline Phosphatase: 53 U/L (ref 39–117)
BUN: 36 mg/dL — ABNORMAL HIGH (ref 6–23)
CO2: 28 meq/L (ref 19–32)
Calcium: 9 mg/dL (ref 8.4–10.5)
Chloride: 102 meq/L (ref 96–112)
Creatinine, Ser: 0.85 mg/dL (ref 0.40–1.20)
GFR: 60.44 mL/min (ref >60.00)
Glucose, Bld: 97 mg/dL (ref 70–99)
Potassium: 4.4 meq/L (ref 3.5–5.1)
Sodium: 140 meq/L (ref 135–145)
Total Bilirubin: 0.6 mg/dL (ref 0.2–1.2)
Total Protein: 6.4 g/dL (ref 6.0–8.3)

## 2023-09-15 LAB — URIC ACID: Uric Acid, Serum: 6.1 mg/dL (ref 2.4–7.0)

## 2023-09-15 LAB — SEDIMENTATION RATE: Sed Rate: 10 mm/h (ref 0–30)

## 2023-09-15 LAB — LIPID PANEL
Cholesterol: 213 mg/dL — ABNORMAL HIGH (ref 0–200)
HDL: 99.5 mg/dL (ref >39.00)
LDL Cholesterol: 96 mg/dL (ref 0–99)
NonHDL: 113.79
Total CHOL/HDL Ratio: 2
Triglycerides: 89 mg/dL (ref 0.0–149.0)
VLDL: 17.8 mg/dL (ref 0.0–40.0)

## 2023-09-15 LAB — VITAMIN D 25 HYDROXY (VIT D DEFICIENCY, FRACTURES): VITD: 100 ng/mL (ref 30.00–100.00)

## 2023-09-15 LAB — TSH: TSH: 1.81 u[IU]/mL (ref 0.35–5.50)

## 2023-09-15 NOTE — Patient Instructions (Addendum)
K[employer]ik shoes or other new shoes.  Protein every 3-4 hours and water 10-20 ounces every 1-2 hours  [employer] with ankle  New shoes1  Ice and topical rubs at least twice dailyAnkle Pain The ankle joint helps you stand on your leg and allows you to move around. Ankle pain can happen on either side or the back of the ankle. You may have pain in one ankle or both ankles. Ankle pain may be sharp and burning or dull and aching. There may be tenderness, stiffness, redness, or warmth around the ankle. Many things can cause ankle pain. These include an injury to the area and overuse of your ankle. Follow these instructions at home: Activity Rest your ankle as told by your health care provider. Avoid doing things that cause ankle pain. Do not use the injured limb to support your body weight until your provider says that you can. Use crutches as told by your provider. Ask your provider when it is safe to drive if you have a brace on your ankle. Do exercises as told by your provider. If you have a removable brace: Wear the brace as told by your provider. Remove it only as told by your provider. Check the skin around the brace every day. Tell your provider about any concerns. Loosen the brace if your toes tingle, become numb, or turn cold and blue. Keep the brace clean. If the brace is not waterproof: Do not let it get wet. Cover it with a watertight covering when you take a bath or shower. If you have an elastic bandage:  Remove it when you take a bath or a shower. Try not to move your ankle much. Wiggle your toes from time to time. This helps to prevent swelling. Adjust the bandage if it feels too tight. Loosen the bandage if your foot tingles, becomes numb, or turns cold and blue. Managing pain, stiffness, and swelling  If told, put ice on the painful area. If you have a removable brace or elastic bandage, remove it as told by your provider. Put ice in a plastic bag. Place a towel  between your skin and the bag. Leave the ice on for 20 minutes, 2-3 times a day. If your skin turns bright red, remove the ice right away to prevent skin damage. The risk of damage is higher if you cannot feel pain, heat, or cold. Move your toes often to reduce stiffness and swelling. Raise (elevate) your ankle above the level of your heart while you are sitting or lying down. General instructions Take over-the-counter and prescription medicines only as told by your provider. To help you and your provider, write down: How often you have ankle pain. Where the pain is. What the pain feels like. If you are told to wear a certain shoe or insole, make sure you wear it the right way and for as long as you are told. Contact a health care provider if: Your pain gets worse. Your pain does not get better with medicine. You have a fever or chills. You have more trouble walking. You have new symptoms. Your foot, leg, toes, or ankle tingles, becomes numb or swollen, or turns cold and blue. This information is not intended to replace advice given to you by your health care provider. Make sure you discuss any questions you have with your health care provider. Document Revised: 05/08/2022 Document Reviewed: 05/08/2022 Elsevier Patient Education  2024 [employer].

## 2023-09-16 ENCOUNTER — Encounter: Payer: Self-pay | Admitting: Family Medicine

## 2023-09-17 NOTE — Progress Notes (Signed)
Subjective:    Patient ID: Jennifer Wilkins, female    DOB: 10/25/1931, 89 y.o.   MRN: [id number]  Chief Complaint  Patient presents with   Follow-up    6 month    HPI Discussed the use of AI scribe software for clinical note transcription with the patient, who gave verbal consent to proceed.  History of Present Illness   The patient, with a history of hypertension, presents with foot pain localized to the heel area. The pain has been ongoing for approximately a month and is worse at the end of the day. The patient is highly active, walking about four miles a day. The pain is managed with Tylenol and topical rubs, which provide relief. The patient has also noticed a decrease in weight over the past year, but it has remained relatively stable in recent months. The patient's diet is not ideal, with a preference for junk food, but she does consume protein and limit her intake of sweets. The patient's blood pressure was slightly elevated at the start of the visit but normalized later.        Past Medical History:  Diagnosis Date   Anemia 09/30/2016   Arthritis    Elevated BP 04/21/2014   HTN (hypertension)    Hypercholesteremia    Hypocalcemia 06/19/2013   Low back pain radiating to both legs 08/27/2015   Medicare annual wellness visit, subsequent 05/03/2015   Preventative health care 06/02/2016   Stress reaction 04/25/2015    Past Surgical History:  Procedure Laterality Date   CHOLECYSTECTOMY     GALLBLADDER SURGERY  1984   Gallstone removal   LUMBAR LAMINECTOMY/DECOMPRESSION MICRODISCECTOMY N/A 09/11/2016   Procedure: Microlumbar Decompression L3-4 and L4-5, lateral mass fusion at L4-5 using autologous, autograft bone;  Surgeon: Jene Every, MD;  Location: [facility];  Service: Orthopedics;  Laterality: N/A;    Family History  Problem Relation Age of Onset   Osteoporosis Mother    Ulcers Father    Kidney disease Paternal Grandfather     Social History   Socioeconomic History    Marital status: Widowed    Spouse name: Not on file   Number of children: Not on file   Years of education: Not on file   Highest education level: Not on file  Occupational History   Not on file  Tobacco Use   Smoking status: Never   Smokeless tobacco: Never  Substance and Sexual Activity   Alcohol use: No   Drug use: No   Sexual activity: Not Currently  Other Topics Concern   Not on file  Social History Narrative   Widowed July 2017   Social Drivers of Health   Financial Resource Strain: Low Risk  (06/18/2023)   Overall Financial Resource Strain (CARDIA)    Difficulty of Paying Living Expenses: Not hard at all  Food Insecurity: No Food Insecurity (06/18/2023)   Hunger Vital Sign    Worried About Running Out of Food in the Last Year: Never true    Ran Out of Food in the Last Year: Never true  Transportation Needs: No Transportation Needs (05/28/2021)   PRAPARE - Administrator, Civil Service (Medical): No    Lack of Transportation (Non-Medical): No  Physical Activity: Inactive (06/18/2023)   Exercise Vital Sign    Days of Exercise per Week: 0 days    Minutes of Exercise per Session: 0 min  Stress: No Stress Concern Present (06/18/2023)   [employer] of Occupational Health -  Occupational Stress Questionnaire    Feeling of Stress : Not at all  Social Connections: Moderately Isolated (06/18/2023)   Social Connection and Isolation Panel [NHANES]    Frequency of Communication with Friends and Family: Three times a week    Frequency of Social Gatherings with Friends and Family: Three times a week    Attends Religious Services: More than 4 times per year    Active Member of Clubs or Organizations: No    Attends Banker Meetings: Never    Marital Status: Widowed  Intimate Partner Violence: Not At Risk (06/18/2023)   Humiliation, Afraid, Rape, and Kick questionnaire    Fear of Current or Ex-Partner: No    Emotionally Abused: No    Physically  Abused: No    Sexually Abused: No    Outpatient Medications Prior to Visit  Medication Sig Dispense Refill   aspirin 81 MG tablet Take 1 tablet (81 mg total) by mouth daily. Resume 4 days post-op 30 tablet    Calcium Carbonate-Vitamin D 600-400 MG-UNIT tablet Take 1 tablet by mouth 2 (two) times daily.     carvedilol (COREG) 12.5 MG tablet TAKE 1 TABLET DAILY 90 tablet 3   Cyanocobalamin (VITAMIN B-12 CR) 1500 MCG TBCR Take by mouth daily.     Ferrous Sulfate (IRON PO) Take by mouth daily.     FIBER ADULT GUMMIES PO Take 2 each by mouth.     hydrochlorothiazide (HYDRODIURIL) 25 MG tablet Take 1 tablet (25 mg total) by mouth daily. 90 tablet 1   Misc Natural Products (OSTEO BI-FLEX ADV JOINT SHIELD) TABS Take 1 tablet by mouth daily. Resume 5 days post-op     Multiple Vitamins-Minerals (MULTIVITAMIN WITH MINERALS) tablet Take 1 tablet by mouth daily. Resume 5 days post-op     Omega-3 Fatty Acids (FISH OIL) 1000 MG CAPS Take by mouth.     Probiotic Product (PROBIOTIC DAILY PO) Take by mouth.     simvastatin (ZOCOR) 40 MG tablet Take 40 mg by mouth daily at 6 PM.     Turmeric 500 MG CAPS Take 1 capsule by mouth daily. Resume 5 days post-op as needed     vitamin E 400 UNIT capsule Take 1 capsule (400 Units total) by mouth daily. Resume 5 days post-op     Facility-Administered Medications Prior to Visit  Medication Dose Route Frequency Provider Last Rate Last Admin   denosumab (PROLIA) injection 60 mg  60 mg Subcutaneous Once Bradd Canary, MD       [START ON 01/05/2024] denosumab (PROLIA) injection 60 mg  60 mg Subcutaneous Once Bradd Canary, MD        No Known Allergies  Review of Systems  Constitutional:  Negative for fever and malaise/fatigue.  HENT:  Negative for congestion.   Eyes:  Negative for blurred vision.  Respiratory:  Negative for shortness of breath.   Cardiovascular:  Negative for chest pain, palpitations and leg swelling.  Gastrointestinal:  Negative for abdominal  pain, blood in stool and nausea.  Genitourinary:  Negative for dysuria and frequency.  Musculoskeletal:  Positive for joint pain. Negative for falls.  Skin:  Negative for rash.  Neurological:  Negative for dizziness, loss of consciousness and headaches.  Endo/Heme/Allergies:  Negative for environmental allergies.  Psychiatric/Behavioral:  Negative for depression. The patient is not nervous/anxious.        Objective:    Physical Exam Constitutional:      General: She is not in acute distress.  Appearance: Normal appearance. She is well-developed. She is not toxic-appearing.  HENT:     Head: Normocephalic and atraumatic.     Right Ear: External ear normal.     Left Ear: External ear normal.     Nose: Nose normal.  Eyes:     General:        Right eye: No discharge.        Left eye: No discharge.     Conjunctiva/sclera: Conjunctivae normal.  Neck:     Thyroid: No thyromegaly.  Cardiovascular:     Rate and Rhythm: Normal rate and regular rhythm.     Heart sounds: Normal heart sounds. No murmur heard. Pulmonary:     Effort: Pulmonary effort is normal. No respiratory distress.     Breath sounds: Normal breath sounds.  Abdominal:     General: Bowel sounds are normal.     Palpations: Abdomen is soft.     Tenderness: There is no abdominal tenderness. There is no guarding.  Musculoskeletal:        General: Normal range of motion.     Cervical back: Neck supple.  Lymphadenopathy:     Cervical: No cervical adenopathy.  Skin:    General: Skin is warm and dry.  Neurological:     Mental Status: She is alert and oriented to person, place, and time.  Psychiatric:        Mood and Affect: Mood normal.        Behavior: Behavior normal.        Thought Content: Thought content normal.        Judgment: Judgment normal.     BP 122/82   Pulse 85   Temp 97.6 F (36.4 C) (Oral)   Resp 18   Ht 4\' 9"  (1.448 m)   Wt 96 lb 3.2 oz (43.6 kg)   SpO2 97%   BMI 20.82 kg/m  Wt Readings  from Last 3 Encounters:  09/15/23 96 lb 3.2 oz (43.6 kg)  06/18/23 100 lb 3.2 oz (45.5 kg)  03/13/23 96 lb (43.5 kg)    Diabetic Foot Exam - Simple   No data filed    Lab Results  Component Value Date   WBC 5.2 09/15/2023   HGB 12.3 09/15/2023   HCT 37.2 09/15/2023   PLT 242.0 09/15/2023   GLUCOSE 97 09/15/2023   CHOL 213 (H) 09/15/2023   TRIG 89.0 09/15/2023   HDL 99.50 09/15/2023   LDLCALC 96 09/15/2023   ALT 13 09/15/2023   AST 18 09/15/2023   NA 140 09/15/2023   K 4.4 09/15/2023   CL 102 09/15/2023   CREATININE 0.85 09/15/2023   BUN 36 (H) 09/15/2023   CO2 28 09/15/2023   TSH 1.81 09/15/2023   HGBA1C 5.7 03/13/2023    Lab Results  Component Value Date   TSH 1.81 09/15/2023   Lab Results  Component Value Date   WBC 5.2 09/15/2023   HGB 12.3 09/15/2023   HCT 37.2 09/15/2023   MCV 93.0 09/15/2023   PLT 242.0 09/15/2023   Lab Results  Component Value Date   NA 140 09/15/2023   K 4.4 09/15/2023   CO2 28 09/15/2023   GLUCOSE 97 09/15/2023   BUN 36 (H) 09/15/2023   CREATININE 0.85 09/15/2023   BILITOT 0.6 09/15/2023   ALKPHOS 53 09/15/2023   AST 18 09/15/2023   ALT 13 09/15/2023   PROT 6.4 09/15/2023   ALBUMIN 4.0 09/15/2023   CALCIUM 9.0 09/15/2023   ANIONGAP 7 09/12/2016  GFR 60.44 09/15/2023   Lab Results  Component Value Date   CHOL 213 (H) 09/15/2023   Lab Results  Component Value Date   HDL 99.50 09/15/2023   Lab Results  Component Value Date   LDLCALC 96 09/15/2023   Lab Results  Component Value Date   TRIG 89.0 09/15/2023   Lab Results  Component Value Date   CHOLHDL 2 09/15/2023   Lab Results  Component Value Date   HGBA1C 5.7 03/13/2023       Assessment & Plan:  Essential hypertension Assessment & Plan: Well controlled, no changes to meds. Encouraged heart healthy diet such as the DASH diet and exercise as tolerated.   Orders: -     CBC with Differential/Platelet -     Comprehensive metabolic panel -      TSH  Hyperglycemia Assessment & Plan: hgba1c acceptable, minimize simple carbs. Increase exercise as tolerated.    Hyperlipidemia, mixed Assessment & Plan: Encouraged heart healthy diet, increase exercise, avoid trans fats, consider a krill oil cap daily  Orders: -     Lipid panel  Osteoporosis, unspecified osteoporosis type, unspecified pathological fracture presence Assessment & Plan: Encouraged to get adequate exercise, calcium and vitamin d intake consider repeat Dexa scan    Vitamin D deficiency Assessment & Plan: Supplement and monitor   Orders: -     VITAMIN D 25 Hydroxy (Vit-D Deficiency, Fractures)  Right ankle pain, unspecified chronicity -     Sedimentation rate -     Uric acid    Assessment and Plan    Foot Pain Pain localized to the heel, present for about a month, worse at the end of the day. No obvious swelling or redness. Pain is managed with Tylenol and topical rubs. No disruption to sleep or daily activities. Possible low-level arthritis or strain of the Achilles tendon. -Order blood work to check for inflammation (sed rate) and gout (uric acid). -Advise to apply ice and topical rubs twice daily. -Recommend ankle exercises (writing the alphabet with the foot) to strengthen the tendon. -Consider reducing walking distance temporarily to rest the foot. -Consider new shoes for better support and cushioning. -If pain persists, consider imaging or physical therapy.  General Health Maintenance Mildly elevated cholesterol, normal weight, and vitamin D levels. -Continue monitoring cholesterol levels, no medication needed at this time. -Check vitamin D levels, consider additional supplementation if levels are low. -Encourage protein intake every 3-4 hours to maintain muscle strength. -Advise on flu precautions and availability of combo flu/COVID tests.  Blood Pressure Slightly elevated blood pressure at the visit, unclear if patient is currently on  medication. -Clarify if patient is on blood pressure medication. -Recommend weekly blood pressure checks at home. -Consider medication adjustment based on home readings.         Danise Edge, MD

## 2023-10-10 ENCOUNTER — Other Ambulatory Visit: Payer: Self-pay | Admitting: Family Medicine

## 2023-10-10 NOTE — Telephone Encounter (Signed)
 Copied from CRM [id number]. Topic: Clinical - Medication Refill >> Oct 10, 2023  2:14 PM Almira Coaster wrote: Most Recent Primary Care Visit:  Provider: Danise Edge A  Department: [facility]  Visit Type: OFFICE VISIT  Date: 09/15/2023  Medication: hydrochlorothiazide (HYDRODIURIL) 25 MG tablet   Has the patient contacted their pharmacy? Yes (Agent: If no, request that the patient contact the pharmacy for the refill. If patient does not wish to contact the pharmacy document the reason why and proceed with request.) (Agent: If yes, when and what did the pharmacy advise?)  Is this the correct pharmacy for this prescription? Yes If no, delete pharmacy and type the correct one.  This is the patient's preferred pharmacy:  [facility] - [place], [place] - [address] [place] [address] Phone: [phone] Fax: [phone]  [facility] - [place], [place] - [address] [place] [address] Phone: [phone] Fax: [phone]  [facility] [place] - [facility] [address], [address] [place] [address] Phone: [phone] Fax: [phone]   Has the prescription been filled recently? No  Is the patient out of the medication? No  Has the patient been seen for an appointment in the last year OR does the patient have an upcoming appointment? Yes  Can we respond through MyChart? Yes  Agent: Please be advised that Rx refills may take up to 3 business days. We ask that you follow-up with your pharmacy.

## 2023-10-13 MED ORDER — HYDROCHLOROTHIAZIDE 25 MG PO TABS: 25.0000 mg | ORAL_TABLET | Freq: Every day | ORAL | 1 refills | Status: DC

## 2024-01-05 ENCOUNTER — Telehealth: Payer: Self-pay

## 2024-01-05 NOTE — Telephone Encounter (Signed)
 Prolia  VOB initiated via [email or web address]  Next Prolia  inj DUE: 02/04/24

## 2024-01-05 NOTE — Telephone Encounter (Signed)
 Pt ready for scheduling for PROLIA  on or after : 02/04/24  Option# 1: Buy/Bill (Office supplied medication)  Out-of-pocket cost due at time of clinic visit: $0  Number of injection/visits approved: ---  Primary: MEDICARE Prolia  co-insurance: 0% Admin fee co-insurance: 0%  Secondary: TRICARE FOR LIFE-MEDSUP Prolia  co-insurance:  Admin fee co-insurance:   Medical Benefit Details: Date Benefits were checked: 01/05/24 Deductible: $257 Met of $257 Required/ Coinsurance: 0%/ Admin Fee: 0%  Prior Auth: N/A PA# Expiration Date:   # of doses approved: ----------------------------------------------------------------------- Option# 2- Med Obtained from pharmacy:  Pharmacy benefit: Copay $--- (Paid to pharmacy) Admin Fee: --- (Pay at clinic)  Prior Auth: --- PA# Expiration Date:   # of doses approved:   If patient wants fill through the pharmacy benefit please send prescription to: ---, and include estimated need by date in rx notes. Pharmacy will ship medication directly to the office.  Patient NOT eligible for Prolia  Copay Card. Copay Card can make patient's cost as little as $25. Link to apply: [email or web address]://[email or web address].[email or web address]/copay  ** This summary of benefits is an estimation of the patient's out-of-pocket cost. Exact cost may very based on individual plan coverage.

## 2024-01-05 NOTE — Telephone Encounter (Signed)
 Jennifer Wilkins

## 2024-01-27 ENCOUNTER — Encounter: Payer: Self-pay | Admitting: Family Medicine

## 2024-01-27 ENCOUNTER — Ambulatory Visit (INDEPENDENT_AMBULATORY_CARE_PROVIDER_SITE_OTHER): Admitting: Family Medicine

## 2024-01-27 VITALS — BP 142/80 | HR 95 | Ht <= 58 in | Wt 96.0 lb

## 2024-01-27 DIAGNOSIS — M79604 Pain in right leg: Secondary | ICD-10-CM

## 2024-01-27 DIAGNOSIS — I10 Essential (primary) hypertension: Secondary | ICD-10-CM

## 2024-01-27 DIAGNOSIS — R600 Localized edema: Secondary | ICD-10-CM

## 2024-01-27 DIAGNOSIS — M79605 Pain in left leg: Secondary | ICD-10-CM | POA: Diagnosis not present

## 2024-01-27 DIAGNOSIS — R7989 Other specified abnormal findings of blood chemistry: Secondary | ICD-10-CM

## 2024-01-27 LAB — COMPREHENSIVE METABOLIC PANEL WITH GFR
ALT: 13 U/L (ref 0–35)
AST: 16 U/L (ref 0–37)
Albumin: 4.1 g/dL (ref 3.5–5.2)
Alkaline Phosphatase: 50 U/L (ref 39–117)
BUN: 40 mg/dL — ABNORMAL HIGH (ref 6–23)
CO2: 29 meq/L (ref 19–32)
Calcium: 9.4 mg/dL (ref 8.4–10.5)
Chloride: 101 meq/L (ref 96–112)
Creatinine, Ser: 1.06 mg/dL (ref 0.40–1.20)
GFR: 46.25 mL/min — ABNORMAL LOW (ref >60.00)
Glucose, Bld: 93 mg/dL (ref 70–99)
Potassium: 4.7 meq/L (ref 3.5–5.1)
Sodium: 138 meq/L (ref 135–145)
Total Bilirubin: 0.5 mg/dL (ref 0.2–1.2)
Total Protein: 6.4 g/dL (ref 6.0–8.3)

## 2024-01-27 LAB — BRAIN NATRIURETIC PEPTIDE: Pro B Natriuretic peptide (BNP): 155 pg/mL — ABNORMAL HIGH (ref 0.0–100.0)

## 2024-01-27 MED ORDER — CARVEDILOL 12.5 MG PO TABS: 12.5000 mg | ORAL_TABLET | Freq: Every day | ORAL | 3 refills | Status: DC

## 2024-01-27 MED ORDER — HYDROCHLOROTHIAZIDE 25 MG PO TABS: 25.0000 mg | ORAL_TABLET | Freq: Every day | ORAL | 1 refills | Status: DC

## 2024-01-27 NOTE — Progress Notes (Signed)
 Acute Office Visit  Subjective:     Patient ID: Jennifer Wilkins, female    DOB: 03/14/1932, 89 y.o.   MRN: [id number]  Chief Complaint  Patient presents with   Ankle Pain     Patient is in today for lower extremity pain. She is here with her daughter today.   Discussed the use of AI scribe software for clinical note transcription with the patient, who gave verbal consent to proceed.  History of Present Illness Jennifer Wilkins is a 89 year old female who presents with bilateral ankle pain. She is accompanied by her caregiver.  She has been experiencing bilateral lower leg pain for approximately six months, initially affecting the right before progressing to both legs. The pain is significant, rated at 6 to 7 out of 10, and extends from the shin to the top of the toes. It is particularly bothersome at the end of the day and upon waking, causing stiffness and discomfort. Occasional swelling is noted, and there is soreness and pain exacerbated by touch. The pain is not associated with rashes or itching. There have been no falls or injuries to the ankles/feet.  She has been using Advil, taking two pills in the morning, but finds it hard to determine its effectiveness. She has also tried Voltaren gel. There is some mild swelling. She has a history of back surgery and has been walking less due to the pain, previously walking four miles regularly. She attempts to alleviate symptoms by elevating her feet with a pillow at night, which she feels helps.           ROS All review of systems negative except what is listed in the HPI      Objective:    BP (!) 142/80   Pulse 95   Ht 4' 9 (1.448 m)   Wt 96 lb (43.5 kg)   SpO2 100%   BMI 20.77 kg/m    Physical Exam Vitals reviewed.  Constitutional:      Appearance: Normal appearance.   Cardiovascular:     Rate and Rhythm: Normal rate and regular rhythm.     Heart sounds: Normal heart sounds.  Pulmonary:     Effort: Pulmonary effort  is normal.     Breath sounds: Normal breath sounds.   Musculoskeletal:     Right lower leg: 2+ Edema present.     Left lower leg: 2+ Edema present.   Skin:    General: Skin is warm and dry.     Comments: Minimal bilateral lower extremity hyperpigmentation more consistent venous stasis   Neurological:     Mental Status: She is alert and oriented to person, place, and time.   Psychiatric:        Mood and Affect: Mood normal.        Behavior: Behavior normal.        Thought Content: Thought content normal.        Judgment: Judgment normal.        No results found for any visits on 01/27/24.      Assessment & Plan:   Problem List Items Addressed This Visit       Active Problems   Essential hypertension   Relevant Medications   carvedilol  (COREG ) 12.5 MG tablet   hydrochlorothiazide  (HYDRODIURIL ) 25 MG tablet   Other Visit Diagnoses       Bilateral leg pain    -  Primary   Relevant Orders   Comprehensive metabolic panel with GFR  Brain natriuretic peptide     Bilateral lower extremity edema       Relevant Orders   Comprehensive metabolic panel with GFR   Brain natriuretic peptide       Assessment & Plan Bilateral lower extremity pain and swelling Suspected venous insufficiency due to mild swelling. Arthritis less likely due to swelling pattern and lack of joint-specific pain/swelling. - Recommend compression socks to prevent fluid accumulation. - Advise elevating feet when sitting to reduce fluid pooling. - Order blood work today. - Consider nerve studies or vascular specialist referral if symptoms persist. - Discuss x-rays if arthritis is suspected in the future.  Hypertension Blood pressure elevated. Currently on hydrochlorothiazide  and carvedilol . - Continue current meds. Refills provided. Due to see PCP next month, recheck BP then.         Meds ordered this encounter  Medications   carvedilol  (COREG ) 12.5 MG tablet    Sig: Take 1 tablet  (12.5 mg total) by mouth daily.    Dispense:  90 tablet    Refill:  3    Supervising Provider:   DOMENICA BLACKBIRD A [[id number]]   hydrochlorothiazide  (HYDRODIURIL ) 25 MG tablet    Sig: Take 1 tablet (25 mg total) by mouth daily.    Dispense:  90 tablet    Refill:  1    Supervising Provider:   DOMENICA BLACKBIRD A [[id number]]    Return if symptoms worsen or fail to improve, for / keep next regular PCP appt .  Waddell KATHEE Mon, NP

## 2024-01-28 ENCOUNTER — Ambulatory Visit: Payer: Self-pay | Admitting: Family Medicine

## 2024-02-02 ENCOUNTER — Telehealth: Payer: Self-pay | Admitting: Family Medicine

## 2024-02-02 NOTE — Telephone Encounter (Unsigned)
 Copied from CRM [id number]. Topic: Appointments - Scheduling Inquiry for Clinic >> Feb 02, 2024  9:50 AM Donee H wrote: Reason for CRM: Patient wants to schedule echo/ Please follow up with patient with further instructions at [phone]

## 2024-02-03 NOTE — Telephone Encounter (Signed)
 Returned patients call and she have appointment schedule on tomorrow, 02/04/24 @ 1pm for her echocardiogram.   Copied from CRM #[id number]. Topic: Appointments - Appointment Scheduling >> Feb 02, 2024  3:12 PM Zenovia PARAS wrote: Patient Jennifer Wilkins is calling to schedule an appointment. Returning missed phone call to schedule ECHO. Please call mobile phone number that's on file

## 2024-02-04 ENCOUNTER — Ambulatory Visit: Payer: Self-pay | Admitting: Family Medicine

## 2024-02-04 ENCOUNTER — Ambulatory Visit (HOSPITAL_COMMUNITY)
Admission: RE | Admit: 2024-02-04 | Discharge: 2024-02-04 | Disposition: A | Discharge status: Home / Self Care | Source: Ambulatory Visit | Attending: Family Medicine | Admitting: Family Medicine

## 2024-02-04 DIAGNOSIS — R600 Localized edema: Secondary | ICD-10-CM | POA: Diagnosis not present

## 2024-02-04 DIAGNOSIS — R7989 Other specified abnormal findings of blood chemistry: Secondary | ICD-10-CM | POA: Diagnosis not present

## 2024-02-04 DIAGNOSIS — I351 Nonrheumatic aortic (valve) insufficiency: Secondary | ICD-10-CM | POA: Insufficient documentation

## 2024-02-04 LAB — ECHOCARDIOGRAM COMPLETE: S' Lateral: 2 cm

## 2024-02-09 ENCOUNTER — Ambulatory Visit (INDEPENDENT_AMBULATORY_CARE_PROVIDER_SITE_OTHER)

## 2024-02-09 DIAGNOSIS — M810 Age-related osteoporosis without current pathological fracture: Secondary | ICD-10-CM

## 2024-02-09 MED ORDER — DENOSUMAB 60 MG/ML ~~LOC~~ SOSY
60.0000 mg | PREFILLED_SYRINGE | SUBCUTANEOUS | Status: AC
  Administered 2024-08-19: 60 mg via SUBCUTANEOUS

## 2024-02-09 NOTE — Progress Notes (Signed)
 Pt was in office today for Prolia  injection, injection was given in L arm subcutaneous. Pt tolerated well.

## 2024-03-17 NOTE — Assessment & Plan Note (Signed)
 Encouraged to get adequate exercise, calcium and vitamin d intake consider repeat Dexa scan

## 2024-03-17 NOTE — Assessment & Plan Note (Signed)
 Supplement and monitor

## 2024-03-17 NOTE — Assessment & Plan Note (Signed)
 [id number] acceptable, minimize simple carbs. Increase exercise as tolerated.

## 2024-03-17 NOTE — Assessment & Plan Note (Signed)
 Well controlled, no changes to meds. Encouraged heart healthy diet such as the DASH diet and exercise as tolerated.

## 2024-03-17 NOTE — Assessment & Plan Note (Signed)
 Encouraged heart healthy diet, increase exercise, avoid trans fats, consider a krill oil cap daily

## 2024-03-18 ENCOUNTER — Ambulatory Visit: Payer: Self-pay | Admitting: Family Medicine

## 2024-03-18 ENCOUNTER — Ambulatory Visit (HOSPITAL_BASED_OUTPATIENT_CLINIC_OR_DEPARTMENT_OTHER)
Admission: RE | Admit: 2024-03-18 | Discharge: 2024-03-18 | Disposition: A | Discharge status: Home / Self Care | Source: Ambulatory Visit | Attending: Family Medicine | Admitting: Family Medicine

## 2024-03-18 ENCOUNTER — Ambulatory Visit (INDEPENDENT_AMBULATORY_CARE_PROVIDER_SITE_OTHER): Payer: Medicare Other | Admitting: Family Medicine

## 2024-03-18 VITALS — BP 169/84 | HR 80 | Resp 16 | Ht <= 58 in | Wt 99.4 lb

## 2024-03-18 DIAGNOSIS — R739 Hyperglycemia, unspecified: Secondary | ICD-10-CM

## 2024-03-18 DIAGNOSIS — M7732 Calcaneal spur, left foot: Secondary | ICD-10-CM | POA: Diagnosis not present

## 2024-03-18 DIAGNOSIS — I499 Cardiac arrhythmia, unspecified: Secondary | ICD-10-CM

## 2024-03-18 DIAGNOSIS — M25572 Pain in left ankle and joints of left foot: Secondary | ICD-10-CM | POA: Insufficient documentation

## 2024-03-18 DIAGNOSIS — E782 Mixed hyperlipidemia: Secondary | ICD-10-CM | POA: Diagnosis not present

## 2024-03-18 DIAGNOSIS — I10 Essential (primary) hypertension: Secondary | ICD-10-CM | POA: Diagnosis not present

## 2024-03-18 DIAGNOSIS — I491 Atrial premature depolarization: Secondary | ICD-10-CM

## 2024-03-18 DIAGNOSIS — R600 Localized edema: Secondary | ICD-10-CM | POA: Insufficient documentation

## 2024-03-18 DIAGNOSIS — M810 Age-related osteoporosis without current pathological fracture: Secondary | ICD-10-CM | POA: Diagnosis not present

## 2024-03-18 DIAGNOSIS — M7989 Other specified soft tissue disorders: Secondary | ICD-10-CM | POA: Diagnosis not present

## 2024-03-18 DIAGNOSIS — M79662 Pain in left lower leg: Secondary | ICD-10-CM

## 2024-03-18 DIAGNOSIS — E559 Vitamin D deficiency, unspecified: Secondary | ICD-10-CM

## 2024-03-18 LAB — CBC WITH DIFFERENTIAL/PLATELET
Basophils Absolute: 0 K/uL (ref 0.0–0.1)
Basophils Relative: 0.8 % (ref 0.0–3.0)
Eosinophils Absolute: 0.1 K/uL (ref 0.0–0.7)
Eosinophils Relative: 1.1 % (ref 0.0–5.0)
HCT: 38.9 % (ref 36.0–46.0)
Hemoglobin: 12.8 g/dL (ref 12.0–15.0)
Lymphocytes Relative: 33.3 % (ref 12.0–46.0)
Lymphs Abs: 1.5 K/uL (ref 0.7–4.0)
MCHC: 32.9 g/dL (ref 30.0–36.0)
MCV: 92.2 fl (ref 78.0–100.0)
Monocytes Absolute: 0.4 K/uL (ref 0.1–1.0)
Monocytes Relative: 9.7 % (ref 3.0–12.0)
Neutro Abs: 2.5 K/uL (ref 1.4–7.7)
Neutrophils Relative %: 55.1 % (ref 43.0–77.0)
Platelets: 217 K/uL (ref 150.0–400.0)
RBC: 4.22 Mil/uL (ref 3.87–5.11)
RDW: 14.4 % (ref 11.5–15.5)
WBC: 4.6 K/uL (ref 4.0–10.5)

## 2024-03-18 LAB — COMPREHENSIVE METABOLIC PANEL WITH GFR
ALT: 13 U/L (ref 0–35)
AST: 20 U/L (ref 0–37)
Albumin: 4.3 g/dL (ref 3.5–5.2)
Alkaline Phosphatase: 56 U/L (ref 39–117)
BUN: 29 mg/dL — ABNORMAL HIGH (ref 6–23)
CO2: 29 meq/L (ref 19–32)
Calcium: 9.4 mg/dL (ref 8.4–10.5)
Chloride: 103 meq/L (ref 96–112)
Creatinine, Ser: 0.87 mg/dL (ref 0.40–1.20)
GFR: 58.57 mL/min — ABNORMAL LOW (ref >60.00)
Glucose, Bld: 99 mg/dL (ref 70–99)
Potassium: 4.4 meq/L (ref 3.5–5.1)
Sodium: 141 meq/L (ref 135–145)
Total Bilirubin: 0.7 mg/dL (ref 0.2–1.2)
Total Protein: 7.1 g/dL (ref 6.0–8.3)

## 2024-03-18 LAB — LIPID PANEL
Cholesterol: 229 mg/dL — ABNORMAL HIGH (ref 0–200)
HDL: 105 mg/dL (ref >39.00)
LDL Cholesterol: 106 mg/dL — ABNORMAL HIGH (ref 0–99)
NonHDL: 124.07
Total CHOL/HDL Ratio: 2
Triglycerides: 92 mg/dL (ref 0.0–149.0)
VLDL: 18.4 mg/dL (ref 0.0–40.0)

## 2024-03-18 LAB — TSH: TSH: 1.54 u[IU]/mL (ref 0.35–5.50)

## 2024-03-18 LAB — VITAMIN D 25 HYDROXY (VIT D DEFICIENCY, FRACTURES): VITD: 103.4 ng/mL (ref 30.00–100.00)

## 2024-03-18 LAB — SEDIMENTATION RATE: Sed Rate: 11 mm/h (ref 0–30)

## 2024-03-18 LAB — HEMOGLOBIN A1C: Hgb A1c MFr Bld: 5.8 % (ref 4.6–6.5)

## 2024-03-18 NOTE — Telephone Encounter (Signed)
 CRITICAL VALUE STICKER  CRITICAL VALUE: Vitamin D - 103.40

## 2024-03-18 NOTE — Patient Instructions (Addendum)
 ElePremature Atrial Contraction  A premature atrial contraction [facility]) is a kind of irregular heartbeat (arrhythmia). The heart has four chambers, including the upper chambers (atria) and lower chambers (ventricles). Normally, an electrical signal starts in a group of cells called the sinoatrial node (SA node) and travels through the atria, causing them to pump blood into the ventricles. During a PAC, the atria beat too early, before they have had time to fill with blood. The heartbeat pauses afterward so the heart can fill with blood for the next beat. Sometimes PAC can be a warning sign of another type of arrhythmia called atrial fibrillation. Atrial fibrillation may allow blood to pool in the atria and form clots. If a clot travels to the brain, it can cause a stroke. What are the causes? The cause of this condition is often unknown. Sometimes, this condition may be caused by heart disease or injury to the heart. What increases the risk? You are more likely to develop this condition if: You have heart disease. You are 47 years of age or older. Episodes may be triggered by: Tobacco, alcohol, or caffeine use. Stimulant drugs. Some medicines or supplements. Stress and anxiety. What are the signs or symptoms? Symptoms of this condition include: The feeling of a pause in the heartbeat. The first heartbeat after the skipped beat may feel more forceful. A feeling that your heart is fluttering. A quick feeling of dizziness or faintness. How is this diagnosed? This condition is diagnosed based on: Your medical history or symptoms. A physical exam. Your health care provider may listen to your heart. An ECG (electrocardiogram) to monitor the electrical activity of your heart. An ambulatory cardiac monitor that records your heartbeats for 24 hours or more. You may also have: Blood tests. An echocardiogram, which creates an image of your heart. How is this treated? Treatment depends on how often  your symptoms happen and other risk factors. Treatments may include: Medicines. A catheter ablation procedure to destroy the part of the heart tissue that sends abnormal signals. In many cases, treatment may not be needed. Follow these instructions at home: Lifestyle  Do not use any products that contain nicotine or tobacco. These products include cigarettes, chewing tobacco, and vaping devices, such as e-cigarettes. If you need help quitting, ask your health care provider. Exercise regularly. Ask your health care provider what type of exercise is safe for you. Try to get at least 7-9 hours of sleep each night. Find healthy ways to manage stress. Avoid stressful situations when possible. Alcohol use Do not drink alcohol if: Your health care provider tells you not to drink. You are pregnant, may be pregnant, or are planning to become pregnant. Alcohol triggers your episodes. If you drink alcohol: Limit how much you have to: 0-1 drink a day for women. 0-2 drinks a day for men. Know how much alcohol is in your drink. In the [place] one drink equals one 12 oz bottle of beer (355 mL), one 5 oz glass of wine (148 mL), or one 1 oz glass of hard liquor (44 mL). General instructions Take over-the-counter and prescription medicines only as told by your health care provider. If caffeine triggers episodes, do not eat, drink, or use anything with caffeine in it. Contact a health care provider if: You feel your heart is fluttering. Your heart skips beats and you feel dizzy, light-headed, or very tired. Get help right away if: You have chest pain. You have trouble breathing. You faint. You have any symptoms of  a stroke. BE FAST is an easy way to remember the main warning signs of a stroke: B - Balance. Signs are dizziness, sudden trouble walking, or loss of balance. E - Eyes. Signs are trouble seeing or a sudden change in vision. F - Face. Signs are sudden weakness or numbness of the face, or the  face or eyelid drooping on one side. A - Arms. Signs are weakness or numbness in an arm. This happens suddenly and usually on one side of the body. S - Speech. Signs are sudden trouble speaking, slurred speech, or trouble understanding what people say. T - Time. Time to call emergency services. Write down what time symptoms started. You have other signs of stroke, such as: A sudden, severe headache with no known cause. Nausea or vomiting. Seizure. These symptoms may be an emergency. Get help right away. Call 911. Do not wait to see if the symptoms will go away. Do not drive yourself to the hospital. This information is not intended to replace advice given to you by your health care provider. Make sure you discuss any questions you have with your health care provider. Document Revised: 12/14/2021 Document Reviewed: 12/14/2021 Elsevier Patient Education  2024 Elsevier Inc[employer] heart 3 x a day for 15 minutes Minimize salt Add an evening protein filled snack Compression hose Report if worsening

## 2024-03-21 ENCOUNTER — Encounter: Payer: Self-pay | Admitting: Family Medicine

## 2024-03-21 NOTE — Progress Notes (Signed)
 Subjective:    Patient ID: Jennifer Wilkins, female    DOB: Apr 16, 1932, 89 y.o.   MRN: [id number]  Chief Complaint  Patient presents with   Hypertension    Here for follow up    HPI Discussed the use of AI scribe software for clinical note transcription with the patient, who gave verbal consent to proceed.  History of Present Illness Jennifer Wilkins is a 89 year old female who presents with persistent left ankle swelling and pain.  She has been experiencing persistent swelling and pain in her left ankle for several months. Initially, both ankles were swollen, but the right ankle has since resolved, leaving only the left ankle affected. The swelling does not subside overnight and remains constant throughout the day. The pain is described as a sharp ache near the inside of the left ankle, worsening with activity and by the end of the day. She takes plain Tylenol  at bedtime and Advil in the morning for pain management, which provides some relief. She has started wearing special pressure socks to manage the swelling. No history of injury or surgery to the affected leg. No chest pain, increased trouble breathing, numbness, tingling, or swelling in the toes.  Her dietary intake is limited to one meal a day, primarily to avoid weight gain due to back issues. She does not consume snacks after 4 PM. She is not currently taking vitamin D  supplements, although she has been low in the past. She is concerned about the potential for worsening symptoms and the impact on her mobility, stating, 'Someday I may not be able to walk.'    Past Medical History:  Diagnosis Date   Anemia 09/30/2016   Arthritis    Elevated BP 04/21/2014   HTN (hypertension)    Hypercholesteremia    Hypocalcemia 06/19/2013   Low back pain radiating to both legs 08/27/2015   Medicare annual wellness visit, subsequent 05/03/2015   Preventative health care 06/02/2016   Stress reaction 04/25/2015    Past Surgical History:  Procedure  Laterality Date   CHOLECYSTECTOMY     GALLBLADDER SURGERY  1984   Gallstone removal   LUMBAR LAMINECTOMY/DECOMPRESSION MICRODISCECTOMY N/A 09/11/2016   Procedure: Microlumbar Decompression L3-4 and L4-5, lateral mass fusion at L4-5 using autologous, autograft bone;  Surgeon: Reyes Billing, MD;  Location: [facility];  Service: Orthopedics;  Laterality: N/A;    Family History  Problem Relation Age of Onset   Osteoporosis Mother    Ulcers Father    Kidney disease Paternal Grandfather     Social History   Socioeconomic History   Marital status: Widowed    Spouse name: Not on file   Number of children: Not on file   Years of education: Not on file   Highest education level: Not on file  Occupational History   Not on file  Tobacco Use   Smoking status: Never   Smokeless tobacco: Never  Substance and Sexual Activity   Alcohol use: No   Drug use: No   Sexual activity: Not Currently  Other Topics Concern   Not on file  Social History Narrative   Widowed July 2017   Social Drivers of Health   Financial Resource Strain: Low Risk  (06/18/2023)   Overall Financial Resource Strain (CARDIA)    Difficulty of Paying Living Expenses: Not hard at all  Food Insecurity: No Food Insecurity (06/18/2023)   Hunger Vital Sign    Worried About Running Out of Food in the Last Year: Never  true    Ran Out of Food in the Last Year: Never true  Transportation Needs: No Transportation Needs (05/28/2021)   PRAPARE - Administrator, Civil Service (Medical): No    Lack of Transportation (Non-Medical): No  Physical Activity: Inactive (06/18/2023)   Exercise Vital Sign    Days of Exercise per Week: 0 days    Minutes of Exercise per Session: 0 min  Stress: No Stress Concern Present (06/18/2023)   [employer] of Occupational Health - Occupational Stress Questionnaire    Feeling of Stress : Not at all  Social Connections: Moderately Isolated (06/18/2023)   Social Connection and Isolation  Panel    Frequency of Communication with Friends and Family: Three times a week    Frequency of Social Gatherings with Friends and Family: Three times a week    Attends Religious Services: More than 4 times per year    Active Member of Clubs or Organizations: No    Attends Banker Meetings: Never    Marital Status: Widowed  Intimate Partner Violence: Not At Risk (06/18/2023)   Humiliation, Afraid, Rape, and Kick questionnaire    Fear of Current or Ex-Partner: No    Emotionally Abused: No    Physically Abused: No    Sexually Abused: No    Outpatient Medications Prior to Visit  Medication Sig Dispense Refill   aspirin  81 MG tablet Take 1 tablet (81 mg total) by mouth daily. Resume 4 days post-op 30 tablet    Calcium Carbonate-Vitamin D  600-400 MG-UNIT tablet Take 1 tablet by mouth 2 (two) times daily.     carvedilol  (COREG ) 12.5 MG tablet Take 1 tablet (12.5 mg total) by mouth daily. 90 tablet 3   Cyanocobalamin  (VITAMIN B-12 CR) 1500 MCG TBCR Take by mouth daily.     Diclofenac Sodium (VOLTAREN EX) Apply topically.     Ferrous Sulfate (IRON PO) Take by mouth daily.     FIBER ADULT GUMMIES PO Take 2 each by mouth.     hydrochlorothiazide  (HYDRODIURIL ) 25 MG tablet Take 1 tablet (25 mg total) by mouth daily. 90 tablet 1   Misc Natural Products (OSTEO BI-FLEX ADV JOINT SHIELD) TABS Take 1 tablet by mouth daily. Resume 5 days post-op     Multiple Vitamins-Minerals (MULTIVITAMIN WITH MINERALS) tablet Take 1 tablet by mouth daily. Resume 5 days post-op     Omega-3 Fatty Acids (FISH OIL) 1000 MG CAPS Take by mouth.     Probiotic Product (PROBIOTIC DAILY PO) Take by mouth.     simvastatin  (ZOCOR ) 40 MG tablet Take 40 mg by mouth daily at 6 PM.     Turmeric 500 MG CAPS Take 1 capsule by mouth daily. Resume 5 days post-op as needed     vitamin E  400 UNIT capsule Take 1 capsule (400 Units total) by mouth daily. Resume 5 days post-op     Facility-Administered Medications Prior to  Visit  Medication Dose Route Frequency Provider Last Rate Last Admin   denosumab  (PROLIA ) injection 60 mg  60 mg Subcutaneous Once Daisia Slomski A, MD       [START ON 08/07/2024] denosumab  (PROLIA ) injection 60 mg  60 mg Subcutaneous Q6 months Domenica Harlene LABOR, MD        No Known Allergies  Review of Systems  Constitutional:  Positive for malaise/fatigue. Negative for fever.  HENT:  Negative for congestion.   Eyes:  Negative for blurred vision.  Respiratory:  Negative for shortness of breath.  Cardiovascular:  Positive for leg swelling. Negative for chest pain and palpitations.  Gastrointestinal:  Negative for abdominal pain, blood in stool and nausea.  Genitourinary:  Negative for dysuria and frequency.  Musculoskeletal:  Positive for joint pain. Negative for falls.  Skin:  Negative for rash.  Neurological:  Negative for dizziness, loss of consciousness and headaches.  Endo/Heme/Allergies:  Negative for environmental allergies.  Psychiatric/Behavioral:  Negative for depression. The patient is not nervous/anxious.        Objective:    Physical Exam Constitutional:      General: She is not in acute distress.    Appearance: Normal appearance. She is well-developed. She is not toxic-appearing.  HENT:     Head: Normocephalic and atraumatic.     Right Ear: External ear normal.     Left Ear: External ear normal.     Nose: Nose normal.  Eyes:     General:        Right eye: No discharge.        Left eye: No discharge.     Conjunctiva/sclera: Conjunctivae normal.  Neck:     Thyroid : No thyromegaly.  Cardiovascular:     Rate and Rhythm: Normal rate and regular rhythm.     Heart sounds: Normal heart sounds. No murmur heard. Pulmonary:     Effort: Pulmonary effort is normal. No respiratory distress.     Breath sounds: Normal breath sounds.  Abdominal:     General: Bowel sounds are normal.     Palpations: Abdomen is soft.     Tenderness: There is no abdominal tenderness. There is  no guarding.  Musculoskeletal:        General: Normal range of motion.     Cervical back: Neck supple.     Right lower leg: Edema present.     Left lower leg: Edema present.     Comments: LLE 1+ edema, RLE trace edema  Lymphadenopathy:     Cervical: No cervical adenopathy.  Skin:    General: Skin is warm and dry.  Neurological:     Mental Status: She is alert and oriented to person, place, and time.  Psychiatric:        Mood and Affect: Mood normal.        Behavior: Behavior normal.        Thought Content: Thought content normal.        Judgment: Judgment normal.     BP (!) 169/84 (BP Location: Right Arm, Patient Position: Sitting, Cuff Size: Small)   Pulse 80   Resp 16   Ht 4' 9 (1.448 m)   Wt 99 lb 6.4 oz (45.1 kg)   SpO2 100%   BMI 21.51 kg/m  Wt Readings from Last 3 Encounters:  03/18/24 99 lb 6.4 oz (45.1 kg)  01/27/24 96 lb (43.5 kg)  09/15/23 96 lb 3.2 oz (43.6 kg)    Diabetic Foot Exam - Simple   No data filed    Lab Results  Component Value Date   WBC 4.6 03/18/2024   HGB 12.8 03/18/2024   HCT 38.9 03/18/2024   PLT 217.0 03/18/2024   GLUCOSE 99 03/18/2024   CHOL 229 (H) 03/18/2024   TRIG 92.0 03/18/2024   HDL 105.00 03/18/2024   LDLCALC 106 (H) 03/18/2024   ALT 13 03/18/2024   AST 20 03/18/2024   NA 141 03/18/2024   K 4.4 03/18/2024   CL 103 03/18/2024   CREATININE 0.87 03/18/2024   BUN 29 (H) 03/18/2024  CO2 29 03/18/2024   TSH 1.54 03/18/2024   HGBA1C 5.8 03/18/2024    Lab Results  Component Value Date   TSH 1.54 03/18/2024   Lab Results  Component Value Date   WBC 4.6 03/18/2024   HGB 12.8 03/18/2024   HCT 38.9 03/18/2024   MCV 92.2 03/18/2024   PLT 217.0 03/18/2024   Lab Results  Component Value Date   NA 141 03/18/2024   K 4.4 03/18/2024   CO2 29 03/18/2024   GLUCOSE 99 03/18/2024   BUN 29 (H) 03/18/2024   CREATININE 0.87 03/18/2024   BILITOT 0.7 03/18/2024   ALKPHOS 56 03/18/2024   AST 20 03/18/2024   ALT 13  03/18/2024   PROT 7.1 03/18/2024   ALBUMIN 4.3 03/18/2024   CALCIUM 9.4 03/18/2024   ANIONGAP 7 09/12/2016   GFR 58.57 (L) 03/18/2024   Lab Results  Component Value Date   CHOL 229 (H) 03/18/2024   Lab Results  Component Value Date   HDL 105.00 03/18/2024   Lab Results  Component Value Date   LDLCALC 106 (H) 03/18/2024   Lab Results  Component Value Date   TRIG 92.0 03/18/2024   Lab Results  Component Value Date   CHOLHDL 2 03/18/2024   Lab Results  Component Value Date   HGBA1C 5.8 03/18/2024       Assessment & Plan:  Essential hypertension Assessment & Plan: Well controlled, no changes to meds. Encouraged heart healthy diet such as the DASH diet and exercise as tolerated.   Orders: -     Comprehensive metabolic panel with GFR -     CBC with Differential/Platelet -     TSH -     Ambulatory referral to Cardiology  Hyperglycemia Assessment & Plan: hgba1c acceptable, minimize simple carbs. Increase exercise as tolerated.   Orders: -     Hemoglobin A1c -     Ambulatory referral to Cardiology  Vitamin D  deficiency Assessment & Plan: Supplement and monitor   Orders: -     VITAMIN D  25 Hydroxy (Vit-D Deficiency, Fractures)  Hyperlipidemia, mixed Assessment & Plan: Encouraged heart healthy diet, increase exercise, avoid trans fats, consider a krill oil cap daily  Orders: -     Lipid panel -     Ambulatory referral to Cardiology  Osteoporosis, unspecified osteoporosis type, unspecified pathological fracture presence Assessment & Plan: Encouraged to get adequate exercise, calcium and vitamin d  intake consider repeat Dexa scan    Irregular heart beat -     EKG 12-Lead -     Ambulatory referral to Cardiology  Acute left ankle pain -     DG Ankle Complete Left; Future -     Sedimentation rate -     US  Venous Img Lower Unilateral Left (DVT); Future  Pain of left calf  Edema of left lower extremity -     US  Venous Img Lower Unilateral Left (DVT);  Future -     Ambulatory referral to Cardiology  Premature atrial contractions -     Ambulatory referral to Cardiology    Assessment and Plan Assessment & Plan Left lower extremity pain and swelling Chronic left lower extremity pain and swelling, primarily around the ankle, persisting for several months. Pain is sharp and worsens with activity, particularly by the end of the day. Swelling does not resolve overnight. Differential includes vascular issues, low protein levels, or other circulatory problems. - Order x-ray of the left ankle - Order ultrasound of the left lower extremity - Advise  wearing compression stockings - Instruct to elevate feet above heart three times a day for 10-15 minutes - Advise switching to Tylenol  twice daily instead of Advil due to potential risks including stomach upset, kidney issues, and elevated blood pressure - Consider referral to vascular surgeon if no improvement or if imaging is inconclusive - Advise minimizing salt intake - Encourage protein intake in the evening to maintain oncotic pressure and reduce edema  Ventricular premature depolarizations (PVCs) Newly detected frequent PVCs, possibly contributing to circulatory inefficiency and swelling. Requires further evaluation to determine if atrial fibrillation or other arrhythmias are present. - Order EKG to assess heart rhythm - Refer to cardiology for further evaluation of PVCs and mild aortic valve disease  Essential hypertension Blood pressure elevated during the visit. - Advise discontinuing Advil and using Tylenol  instead  Vitamin D  deficiency Vitamin D  deficiency with previous low levels. Currently not taking vitamin D  supplements. - Order blood test to check current vitamin D  levels - Advise waiting for lab results before restarting vitamin D  supplementation  Follow-Up Follow-up plans discussed to ensure comprehensive management of her conditions. - Schedule follow-up appointment in 3  months - Advise to contact the office if symptoms worsen or if there are concerns before the next scheduled visit - Ensure cardiology referral is made and follow-up with cardiology as advised - Instruct to update contact information at the front desk to ensure her daughter is the primary contact  Recording duration: 39 minutes     Harlene Horton, MD

## 2024-04-26 ENCOUNTER — Telehealth: Payer: Self-pay | Admitting: Family Medicine

## 2024-04-26 ENCOUNTER — Other Ambulatory Visit: Payer: Self-pay | Admitting: Family Medicine

## 2024-04-26 DIAGNOSIS — I10 Essential (primary) hypertension: Secondary | ICD-10-CM

## 2024-04-26 MED ORDER — CARVEDILOL 12.5 MG PO TABS: 12.5000 mg | ORAL_TABLET | Freq: Every day | ORAL | 1 refills | Status: DC

## 2024-04-26 MED ORDER — HYDROCHLOROTHIAZIDE 25 MG PO TABS: 25.0000 mg | ORAL_TABLET | Freq: Every day | ORAL | 1 refills | Status: DC

## 2024-04-26 NOTE — Telephone Encounter (Unsigned)
 Copied from CRM [id number]. Topic: Clinical - Medication Refill >> Apr 26, 2024  8:03 AM Kendralyn S wrote: Medication:  hydrochlorothiazide  (HYDRODIURIL ) 25 MG tablet carvedilol  (COREG ) 12.5 MG tablet    Has the patient contacted their pharmacy? No (Agent: If no, request that the patient contact the pharmacy for the refill. If patient does not wish to contact the pharmacy document the reason why and proceed with request.) (Agent: If yes, when and what did the pharmacy advise?)  This is the patient's preferred pharmacy:  [facility] - [place] - [address] [place] [address] Phone: [phone] Fax: [phone]    Is this the correct pharmacy for this prescription? Yes If no, delete pharmacy and type the correct one.   Has the prescription been filled recently? No  Is the patient out of the medication? No , 4 pills each left  Has the patient been seen for an appointment in the last year OR does the patient have an upcoming appointment? Yes  Can we respond through MyChart? No  Agent: Please be advised that Rx refills may take up to 3 business days. We ask that you follow-up with your pharmacy.

## 2024-04-26 NOTE — Telephone Encounter (Signed)
 Copied from CRM [id number]. Topic: Clinical - Medication Refill >> Apr 26, 2024  8:03 AM Kendralyn S wrote: Medication:  hydrochlorothiazide  (HYDRODIURIL ) 25 MG tablet carvedilol  (COREG ) 12.5 MG tablet    Has the patient contacted their pharmacy? No (Agent: If no, request that the patient contact the pharmacy for the refill. If patient does not wish to contact the pharmacy document the reason why and proceed with request.) (Agent: If yes, when and what did the pharmacy advise?)  This is the patient's preferred pharmacy:  [facility] - [place], [place] - [address] [place] [address] Phone: [phone] Fax: [phone]    Is this the correct pharmacy for this prescription? Yes If no, delete pharmacy and type the correct one.   Has the prescription been filled recently? No  Is the patient out of the medication? No , 4 pills each left  Has the patient been seen for an appointment in the last year OR does the patient have an upcoming appointment? Yes  Can we respond through MyChart? No  Agent: Please be advised that Rx refills may take up to 3 business days. We ask that you follow-up with your pharmacy.

## 2024-04-30 ENCOUNTER — Telehealth: Payer: Self-pay

## 2024-04-30 NOTE — Telephone Encounter (Signed)
 Does pt need PCV20 ? I see pneumococcal ppv and pneumococcal 13     Copied from CRM #[id number]. Topic: Clinical - Medical Advice >> Apr 30, 2024  9:26 AM Jennifer Wilkins wrote: Reason for CRM: Patient states the nursing home told her to call and ask if she needs to get the following shots and the nursing home can give them to her.  Covid,shingles,Hepatitis,and Pneumonia, and needs a call back on this as soon as possible.

## 2024-05-03 NOTE — Telephone Encounter (Signed)
 Spoke with pt's daughter , she stated she will make the patient aware that its ok to get pcv20 at the nursing home

## 2024-05-07 DIAGNOSIS — Z23 Encounter for immunization: Secondary | ICD-10-CM | POA: Diagnosis not present

## 2024-05-24 ENCOUNTER — Ambulatory Visit

## 2024-05-31 DIAGNOSIS — M1990 Unspecified osteoarthritis, unspecified site: Secondary | ICD-10-CM | POA: Insufficient documentation

## 2024-05-31 DIAGNOSIS — I10 Essential (primary) hypertension: Secondary | ICD-10-CM | POA: Insufficient documentation

## 2024-05-31 DIAGNOSIS — E7800 Pure hypercholesterolemia, unspecified: Secondary | ICD-10-CM | POA: Insufficient documentation

## 2024-06-02 ENCOUNTER — Ambulatory Visit

## 2024-06-02 VITALS — BP 128/70 | HR 97 | Ht <= 58 in | Wt 101.8 lb

## 2024-06-02 DIAGNOSIS — I491 Atrial premature depolarization: Secondary | ICD-10-CM | POA: Insufficient documentation

## 2024-06-02 DIAGNOSIS — I493 Ventricular premature depolarization: Secondary | ICD-10-CM | POA: Insufficient documentation

## 2024-06-02 DIAGNOSIS — Z23 Encounter for immunization: Secondary | ICD-10-CM | POA: Insufficient documentation

## 2024-06-02 DIAGNOSIS — I10 Essential (primary) hypertension: Secondary | ICD-10-CM | POA: Insufficient documentation

## 2024-06-02 DIAGNOSIS — E782 Mixed hyperlipidemia: Secondary | ICD-10-CM | POA: Insufficient documentation

## 2024-06-02 DIAGNOSIS — I351 Nonrheumatic aortic (valve) insufficiency: Secondary | ICD-10-CM

## 2024-06-02 HISTORY — DX: Ventricular premature depolarization: I49.3

## 2024-06-02 HISTORY — DX: Encounter for immunization: Z23

## 2024-06-02 HISTORY — DX: Atrial premature depolarization: I49.1

## 2024-06-02 HISTORY — DX: Nonrheumatic aortic (valve) insufficiency: I35.1

## 2024-06-02 NOTE — Assessment & Plan Note (Signed)
 PCP referred for PVCs. No significant PVCs noted on EKG here today.  Will follow-up with Zio patch for 7 days.

## 2024-06-02 NOTE — Patient Instructions (Signed)
 Medication Instructions:  Your physician recommends that you continue on your current medications as directed. Please refer to the Current Medication list given to you today.  *If you need a refill on your cardiac medications before your next appointment, please call your pharmacy*  Lab Work: None If you have labs (blood work) drawn today and your tests are completely normal, you will receive your results only by: MyChart Message (if you have MyChart) OR A paper copy in the mail If you have any lab test that is abnormal or we need to change your treatment, we will call you to review the results.  Testing/Procedures: A zio monitor was ordered today. It will remain on for 7 days. You will then return monitor and event diary in provided box. It takes 1-2 weeks for report to be downloaded and returned to us . We will call you with the results. If monitor falls off or has orange flashing light, please call Zio for further instructions.    Follow-Up: At [facility], you and your health needs are our priority.  As part of our continuing mission to provide you with exceptional heart care, our providers are all part of one team.  This team includes your primary Cardiologist (physician) and Advanced Practice Providers or APPs (Physician Assistants and Nurse Practitioners) who all work together to provide you with the care you need, when you need it.  Your next appointment:   3 month(s)  Provider:   Alean Kobus, MD    We recommend signing up for the patient portal called MyChart.  Sign up information is provided on this After Visit Summary.  MyChart is used to connect with patients for Virtual Visits (Telemedicine).  Patients are able to view lab/test results, encounter notes, upcoming appointments, etc.  Non-urgent messages can be sent to your provider as well.   To learn more about what you can do with MyChart, go to [email or web address].   Other Instructions None

## 2024-06-02 NOTE — Assessment & Plan Note (Signed)
 Lipid panel from 03/18/2024 reviewed. Currently remains on simvastatin  40 mg once daily and fish oil. Continue the same.

## 2024-06-02 NOTE — Progress Notes (Signed)
 Cardiology Consultation:    Date:  06/02/2024   ID:  Jennifer Wilkins, DOB [birth year], MRN [id number]  PCP:  Jennifer Harlene LABOR, MD  Cardiologist:  Jennifer Wilkins Jennifer Merkley, MD   Referring MD: Jennifer Harlene LABOR, MD   No chief complaint on file.    ASSESSMENT AND PLAN:   [name] 89 year old woman with history of mild aortic insufficiency on echocardiogram July 2025, chronic bilateral lower extremity edema appears consistent with chronic venous insufficiency, hypertension, hyperlipidemia, chronic back pain notably worse over the past 6 months limiting her mobility. Denies any prior history of CAD, CHF, MI, CVA.  Here for further evaluation of PACs and PVCs Problem List Items Addressed This Visit     Hyperlipidemia, mixed   Lipid panel from 03/18/2024 reviewed. Currently remains on simvastatin  40 mg once daily and fish oil. Continue the same.       Essential hypertension   Well-controlled. Currently on carvedilol  12.5 mg twice daily and hydrochlorothiazide  25 mg once daily. Continue the same. Given her elderly age Target blood pressure below 140/80 mmHg.       Aortic insufficiency   Mild to moderate aortic insufficiency on echocardiogram from 02/04/2024 on review of images appear to be associated with tricuspid aortic valve. Normal biventricular function with normal size.  Discussed the findings with her. Do not anticipate any need for interventions. Do not expect the degree of aortic insufficiency to be causing any symptoms.  Can reevaluate with repeat echocardiogram tentatively in 18 to 24 months.       Premature atrial contractions - Primary   Please and PACs noted on EKG today. No evidence of PVCs. Appears asymptomatic. Discussed benign nature of this.  Will assess with Zio patch for 7 days to assess overall burden.  Advised to cut back on caffeinated drink consumption, considering decaffeinated coffee and cut down on tea intake.       Relevant Orders   EKG 12-Lead  (Completed)   PVC's (premature ventricular contractions)   PCP referred for PVCs. No significant PVCs noted on EKG here today.  Will follow-up with Zio patch for 7 days.      Relevant Orders   EKG 12-Lead (Completed)   LONG TERM MONITOR (3-14 DAYS)   Return to clinic tentatively in 3 months.   History of Present Illness:    Jennifer Wilkins is a 89 y.o. female who is being seen today for the evaluation of irregular heartbeat, PVCs and mild aortic insufficiency at the request of Jennifer Harlene LABOR, MD.   Pleasant woman here for the visit accompanied by her daughter today.  She has been living at at [facility] at [facility] assisted living facility for the past 12 years.  Has mild aortic insufficiency on echocardiogram July 2025, chronic bilateral lower extremity edema appears consistent with chronic venous insufficiency, hypertension, hyperlipidemia, chronic back pain notably worse over the past 6 months limiting her mobility. Denies any prior history of CAD, CHF, MI, CVA.  Transthoracic echocardiogram 02/04/2024 with normal biventricular function LVEF 65 to 70%, indeterminate diastolic function, mild aortic insufficiency. Venous duplex study 03/18/2024 showed no evidence of DVT  Pleasant woman mentions was walking up to 10,000 steps each day until 6 months ago.  With her back pain related to chronic degenerative disc disease, her mobility has somewhat been limited.  She also has bilateral lower extremity edema which has been chronic and has been using compression socks.  She feels uncomfortable with ankle swelling.  Has not seen  any significant increase in this.  Denies any chest pain, shortness of breath, orthopnea or paroxysmal nocturnal dyspnea.  Denies any palpitations. Denies any lightheadedness, dizziness or syncopal episodes. Did sustain a mechanical fall couple months ago, falling backwards. Ambulates using a cane.  Takes her medications consistently.  Does not smoke. Drinks  half cup of coffee in the morning and glass of sweet tea in the afternoon.  EKG in the clinic today shows sinus rhythm with frequent atrial ectopic beats in a bigeminy pattern.  Normal PR interval.  QRS 70 ms.  Lab work from 03/18/2024 shows TSH 1.54, normal Hemoglobin A1c 5.8. CBC unremarkable. CMP with BUN 29, creatinine 0.87, eGFR 58, consistent with CKD stage III. Normal transaminases and alkaline phosphatase Sodium 141 and potassium 4.4. Lipid panel total cholesterol 229, HDL 105, LDL 106, triglycerides 92.  proBNP was 155 mildly elevated 01/27/2024.     Past Medical History:  Diagnosis Date   Anemia 09/30/2016   Arthritis    Essential hypertension 04/24/2012   Facial lesion 05/03/2015   Hearing loss 01/21/2014   Recent testing showed right ear at 84 % and left year at 16%     HTN (hypertension)    Hypercholesteremia    Hyperglycemia 05/05/2017   Hyperlipidemia, mixed 04/24/2012   Hypocalcemia 06/19/2013   Hyponatremia 04/10/2020   Low back pain radiating to both legs 08/27/2015   Medicare annual wellness visit, subsequent 05/03/2015   Osteoporosis 04/24/2012   Other fatigue 05/05/2017   Preventative health care 06/02/2016   Spinal stenosis of lumbar region 09/11/2016   Stress reaction 04/25/2015   Thrombocytosis 12/30/2016   Urinary frequency 05/05/2017   Vertigo 07/14/2018   Vitamin D  deficiency 04/24/2012    Past Surgical History:  Procedure Laterality Date   CHOLECYSTECTOMY     GALLBLADDER SURGERY  1984   Gallstone removal   LUMBAR LAMINECTOMY/DECOMPRESSION MICRODISCECTOMY N/A 09/11/2016   Procedure: Microlumbar Decompression L3-4 and L4-5, lateral mass fusion at L4-5 using autologous, autograft bone;  Surgeon: Reyes Billing, MD;  Location: [facility];  Service: Orthopedics;  Laterality: N/A;    Current Medications: Current Meds  Medication Sig   aspirin  81 MG tablet Take 1 tablet (81 mg total) by mouth daily. Resume 4 days post-op   Calcium Carbonate-Vitamin  D 600-400 MG-UNIT tablet Take 1 tablet by mouth 2 (two) times daily.   carvedilol  (COREG ) 12.5 MG tablet Take 1 tablet (12.5 mg total) by mouth daily.   Cyanocobalamin  (VITAMIN B-12 CR) 1500 MCG TBCR Take by mouth daily.   Diclofenac Sodium (VOLTAREN EX) Apply topically.   Ferrous Sulfate (IRON PO) Take by mouth daily.   FIBER ADULT GUMMIES PO Take 2 each by mouth.   hydrochlorothiazide  (HYDRODIURIL ) 25 MG tablet Take 1 tablet (25 mg total) by mouth daily.   Misc Natural Products (OSTEO BI-FLEX ADV JOINT SHIELD) TABS Take 1 tablet by mouth daily. Resume 5 days post-op   Multiple Vitamins-Minerals (MULTIVITAMIN WITH MINERALS) tablet Take 1 tablet by mouth daily. Resume 5 days post-op   Omega-3 Fatty Acids (FISH OIL) 1000 MG CAPS Take by mouth.   Probiotic Product (PROBIOTIC DAILY PO) Take by mouth.   simvastatin  (ZOCOR ) 40 MG tablet Take 40 mg by mouth daily at 6 PM.   Turmeric 500 MG CAPS Take 1 capsule by mouth daily. Resume 5 days post-op as needed   vitamin E  400 UNIT capsule Take 1 capsule (400 Units total) by mouth daily. Resume 5 days post-op   Current Facility-Administered Medications for the 06/02/24  encounter (Office Visit) with Dainelle Hun, Jennifer SAUNDERS, MD  Medication   denosumab  (PROLIA ) injection 60 mg   [START ON 08/07/2024] denosumab  (PROLIA ) injection 60 mg     Allergies:   Patient has no known allergies.   Social History   Socioeconomic History   Marital status: Widowed    Spouse name: Not on file   Number of children: Not on file   Years of education: Not on file   Highest education level: Not on file  Occupational History   Not on file  Tobacco Use   Smoking status: Never   Smokeless tobacco: Never  Substance and Sexual Activity   Alcohol use: No   Drug use: No   Sexual activity: Not Currently  Other Topics Concern   Not on file  Social History Narrative   Widowed July 2017   Social Drivers of Health   Financial Resource Strain: Low Risk  (06/18/2023)    Overall Financial Resource Strain (CARDIA)    Difficulty of Paying Living Expenses: Not hard at all  Food Insecurity: No Food Insecurity (06/18/2023)   Hunger Vital Sign    Worried About Running Out of Food in the Last Year: Never true    Ran Out of Food in the Last Year: Never true  Transportation Needs: No Transportation Needs (05/28/2021)   PRAPARE - Administrator, Civil Service (Medical): No    Lack of Transportation (Non-Medical): No  Physical Activity: Inactive (06/18/2023)   Exercise Vital Sign    Days of Exercise per Week: 0 days    Minutes of Exercise per Session: 0 min  Stress: No Stress Concern Present (06/18/2023)   [employer] of Occupational Health - Occupational Stress Questionnaire    Feeling of Stress : Not at all  Social Connections: Moderately Isolated (06/18/2023)   Social Connection and Isolation Panel    Frequency of Communication with Friends and Family: Three times a week    Frequency of Social Gatherings with Friends and Family: Three times a week    Attends Religious Services: More than 4 times per year    Active Member of Clubs or Organizations: No    Attends Banker Meetings: Never    Marital Status: Widowed     Family History: The patient's family history includes Kidney disease in her paternal grandfather; Osteoporosis in her mother; Ulcers in her father. ROS:   Please see the history of present illness.    All 14 point review of systems negative except as described per history of present illness.  EKGs/Labs/Other Studies Reviewed:    The following studies were reviewed today:   EKG:  EKG Interpretation Date/Time:  Wednesday June 02 2024 13:29:03 EST Ventricular Rate:  97 PR Interval:  162 QRS Duration:  70 QT Interval:  378 QTC Calculation: 480 R Axis:   61  Text Interpretation: Sinus rhythm with Premature atrial complexes No previous ECGs available Confirmed by Liborio Jennifer reddy [phone]) on  06/02/2024 1:33:55 PM    Recent Labs: 01/27/2024: Pro B Natriuretic peptide (BNP) 155.0 03/18/2024: ALT 13; BUN 29; Creatinine, Ser 0.87; Hemoglobin 12.8; Platelets 217.0; Potassium 4.4; Sodium 141; TSH 1.54  Recent Lipid Panel    Component Value Date/Time   CHOL 229 (H) 03/18/2024 1107   TRIG 92.0 03/18/2024 1107   HDL 105.00 03/18/2024 1107   CHOLHDL 2 03/18/2024 1107   VLDL 18.4 03/18/2024 1107   LDLCALC 106 (H) 03/18/2024 1107   LDLCALC 125 (H) 04/06/2020 1151  Physical Exam:    VS:  BP 128/70   Pulse 97   Ht 4' 9 (1.448 m)   Wt 101 lb 12.8 oz (46.2 kg)   BMI 22.03 kg/m     Wt Readings from Last 3 Encounters:  06/02/24 101 lb 12.8 oz (46.2 kg)  03/18/24 99 lb 6.4 oz (45.1 kg)  01/27/24 96 lb (43.5 kg)     GENERAL:  Well nourished, well developed in no acute distress NECK: No JVD; No carotid bruits CARDIAC: RRR, S1 and S2 present, no murmurs, no rubs, no gallops CHEST:  Clear to auscultation without rales, wheezing or rhonchi  Extremities: Bilateral trace ankle edema with knee-high compression socks on.  Pulses bilaterally symmetric with radial 2+ and dorsalis pedis 2+ NEUROLOGIC:  Alert and oriented x 3  Medication Adjustments/Labs and Tests Ordered: Current medicines are reviewed at length with the patient today.  Concerns regarding medicines are outlined above.  Orders Placed This Encounter  Procedures   LONG TERM MONITOR (3-14 DAYS)   EKG 12-Lead   No orders of the defined types were placed in this encounter.   Signed, Verley Pariseau reddy Anothy Bufano, MD, MPH, [facility]. 06/02/2024 2:03 PM    [facility]

## 2024-06-02 NOTE — Assessment & Plan Note (Addendum)
 Mild to moderate aortic insufficiency on echocardiogram from 02/04/2024 on review of images appear to be associated with tricuspid aortic valve. Normal biventricular function with normal size.  Discussed the findings with her. Do not anticipate any need for interventions. Do not expect the degree of aortic insufficiency to be causing any symptoms.  Can reevaluate with repeat echocardiogram tentatively in 18 to 24 months.

## 2024-06-02 NOTE — Assessment & Plan Note (Signed)
 Well-controlled. Currently on carvedilol  12.5 mg twice daily and hydrochlorothiazide  25 mg once daily. Continue the same. Given her elderly age Target blood pressure below 140/80 mmHg.

## 2024-06-02 NOTE — Assessment & Plan Note (Addendum)
 Please and PACs noted on EKG today. No evidence of PVCs. Appears asymptomatic. Discussed benign nature of this.  Will assess with Zio patch for 7 days to assess overall burden.  Advised to cut back on caffeinated drink consumption, considering decaffeinated coffee and cut down on tea intake.

## 2024-06-15 DIAGNOSIS — I493 Ventricular premature depolarization: Secondary | ICD-10-CM | POA: Diagnosis not present

## 2024-06-18 ENCOUNTER — Ambulatory Visit (INDEPENDENT_AMBULATORY_CARE_PROVIDER_SITE_OTHER): Admitting: *Deleted

## 2024-06-18 VITALS — BP 136/60 | HR 83 | Temp 98.6°F | Resp 16 | Ht <= 58 in | Wt 101.0 lb

## 2024-06-18 DIAGNOSIS — M810 Age-related osteoporosis without current pathological fracture: Secondary | ICD-10-CM

## 2024-06-18 DIAGNOSIS — Z0000 Encounter for general adult medical examination without abnormal findings: Secondary | ICD-10-CM

## 2024-06-18 NOTE — Progress Notes (Signed)
 Please attest this visit in the absence of patient primary care provider.   Chief Complaint  Patient presents with   Medicare Wellness     Subjective:   Jennifer Wilkins is a 89 y.o. female who presents for a Medicare Annual Wellness Visit.  Allergies (verified) Patient has no known allergies.   History: Past Medical History:  Diagnosis Date   Anemia 09/30/2016   Arthritis    Essential hypertension 04/24/2012   Facial lesion 05/03/2015   Hearing loss 01/21/2014   Recent testing showed right ear at 84 % and left year at 16%     HTN (hypertension)    Hypercholesteremia    Hyperglycemia 05/05/2017   Hyperlipidemia, mixed 04/24/2012   Hypocalcemia 06/19/2013   Hyponatremia 04/10/2020   Low back pain radiating to both legs 08/27/2015   Medicare annual wellness visit, subsequent 05/03/2015   Osteoporosis 04/24/2012   Other fatigue 05/05/2017   Preventative health care 06/02/2016   Spinal stenosis of lumbar region 09/11/2016   Stress reaction 04/25/2015   Thrombocytosis 12/30/2016   Urinary frequency 05/05/2017   Vertigo 07/14/2018   Vitamin D  deficiency 04/24/2012   Past Surgical History:  Procedure Laterality Date   CHOLECYSTECTOMY     GALLBLADDER SURGERY  1984   Gallstone removal   LUMBAR LAMINECTOMY/DECOMPRESSION MICRODISCECTOMY N/A 09/11/2016   Procedure: Microlumbar Decompression L3-4 and L4-5, lateral mass fusion at L4-5 using autologous, autograft bone;  Surgeon: Reyes Billing, MD;  Location: [facility];  Service: Orthopedics;  Laterality: N/A;   Family History  Problem Relation Age of Onset   Osteoporosis Mother    Ulcers Father    Kidney disease Paternal Grandfather    Social History   Occupational History   Not on file  Tobacco Use   Smoking status: Never   Smokeless tobacco: Never  Substance and Sexual Activity   Alcohol use: No   Drug use: No   Sexual activity: Not Currently   Tobacco Counseling Counseling given: Not Answered  SDOH Screenings    Food Insecurity: No Food Insecurity (06/18/2024)  Housing: Low Risk  (06/18/2024)  Transportation Needs: No Transportation Needs (05/28/2021)  Utilities: Not At Risk (06/18/2024)  Alcohol Screen: Low Risk  (06/18/2023)  Depression (PHQ2-9): Low Risk  (06/18/2024)  Financial Resource Strain: Low Risk  (06/18/2023)  Physical Activity: Inactive (06/18/2024)  Social Connections: Moderately Integrated (06/18/2024)  Stress: No Stress Concern Present (06/18/2024)  Tobacco Use: Low Risk  (06/18/2024)  Health Literacy: Adequate Health Literacy (06/18/2023)   See flowsheets for full screening details  Depression Screen PHQ 2 & 9 Depression Scale- Over the past 2 weeks, how often have you been bothered by any of the following problems? Little interest or pleasure in doing things: 0 Feeling down, depressed, or hopeless (PHQ Adolescent also includes...irritable): 0 PHQ-2 Total Score: 0 Trouble falling or staying asleep, or sleeping too much: 0 Feeling tired or having little energy: 0 Poor appetite or overeating (PHQ Adolescent also includes...weight loss): 0 Feeling bad about yourself - or that you are a failure or have let yourself or your family down: 0 Trouble concentrating on things, such as reading the newspaper or watching television (PHQ Adolescent also includes...like school work): 0 Moving or speaking so slowly that other people could have noticed. Or the opposite - being so fidgety or restless that you have been moving around a lot more than usual: 0 Thoughts that you would be better off dead, or of hurting yourself in some way: 0 PHQ-9 Total Score: 0  If you checked off any problems, how difficult have these problems made it for you to do your work, take care of things at home, or get along with other people?: Not difficult at all     Goals Addressed   None    Visit info / Clinical Intake: Medicare Wellness Visit Type:: Subsequent Annual Wellness Visit Persons participating in  visit:: patient Medicare Wellness Visit Mode:: In-person (required for WTM) Information given by:: patient Interpreter Needed?: No Pre-visit prep was completed: yes AWV questionnaire completed by patient prior to visit?: no Living arrangements:: in retirement community Patient's Overall Health Status Rating: good Typical amount of pain: some (back) Does pain affect daily life?: no Are you currently prescribed opioids?: no  Dietary Habits and Nutritional Risks How many meals a day?: (!) 1 (eats dry cereal in mornings. Eats full meal at lunch) Eats fruit and vegetables daily?: (!) no (eats some fruits, few veggies) Most meals are obtained by: having others provide food In the last 2 weeks, have you had any of the following?: none Diabetic:: no  Functional Status Activities of Daily Living (to include ambulation/medication): Independent Ambulation: Independent Medication Administration: Independent Home Management: Independent Manage your own finances?: yes Primary transportation is: family/friends Concerns about vision?: no *vision screening is required for WTM* (gets eye exam every 2 yrs with Ginnie Pinal) Concerns about hearing?: (!) yes Uses hearing aids?: (!) yes (has to change battery frequently)  Fall Screening Falls in the past year?: 1 (doesn't remember what happened, fell backward maybe when she was trying to put shoes on.) Number of falls in past year: 0 Was there an injury with Fall?: 1 (made tailbone sore) Fall Risk Category Calculator: 2 Patient Fall Risk Level: Moderate Fall Risk  Fall Risk Patient at Risk for Falls Due to: Orthopedic patient Fall risk Follow up: Falls evaluation completed  Home and Transportation Safety: All rugs have non-skid backing?: yes All stairs or steps have railings?: (!) no Grab bars in the bathtub or shower?: (!) no Have non-skid surface in bathtub or shower?: (!) no Good home lighting?: yes Regular seat belt use?: yes Hospital  stays in the last year:: no  Cognitive Assessment Difficulty concentrating, remembering, or making decisions? : no Will 6CIT or Mini Cog be Completed: yes What year is it?: 0 points What month is it?: 0 points Give patient an address phrase to remember (5 components): [address] [place]  About what time is it?: 0 points Count backwards from 20 to 1: 0 points Say the months of the year in reverse: 0 points Repeat the address phrase from earlier: 0 points 6 CIT Score: 0 points  Advance Directives (For Healthcare) Does Patient Have a Medical Advance Directive?: Yes Does patient want to make changes to medical advance directive?: No - Patient declined Type of Advance Directive: Healthcare Power of [place]; Living will Copy of Healthcare Power of Attorney in Chart?: No - copy requested Copy of Living Will in Chart?: No - copy requested  Reviewed/Updated  Reviewed/Updated: Reviewed All (Medical, Surgical, Family, Medications, Allergies, Care Teams, Patient Goals)        Objective:    Today's Vitals   06/18/24 1255  BP: 136/60  Pulse: 83  Resp: 16  Temp: 98.6 F (37 C)  TempSrc: Oral  SpO2: 100%  Weight: 101 lb (45.8 kg)  Height: 4' 9 (1.448 m)   Body mass index is 21.86 kg/m.  Current Medications (verified) Outpatient Encounter Medications as of 06/18/2024  Medication Sig  aspirin  81 MG tablet Take 1 tablet (81 mg total) by mouth daily. Resume 4 days post-op   Calcium Carbonate-Vitamin D  600-400 MG-UNIT tablet Take 1 tablet by mouth 2 (two) times daily.   carvedilol  (COREG ) 12.5 MG tablet Take 1 tablet (12.5 mg total) by mouth daily.   Cyanocobalamin  (VITAMIN B-12 CR) 1500 MCG TBCR Take by mouth daily.   Diclofenac Sodium (VOLTAREN EX) Apply topically.   Ferrous Sulfate (IRON PO) Take by mouth daily.   FIBER ADULT GUMMIES PO Take 2 each by mouth.   hydrochlorothiazide  (HYDRODIURIL ) 25 MG tablet Take 1 tablet (25 mg total) by mouth daily.   Misc Natural  Products (OSTEO BI-FLEX ADV JOINT SHIELD) TABS Take 1 tablet by mouth daily. Resume 5 days post-op   Multiple Vitamins-Minerals (MULTIVITAMIN WITH MINERALS) tablet Take 1 tablet by mouth daily. Resume 5 days post-op   Omega-3 Fatty Acids (FISH OIL) 1000 MG CAPS Take by mouth.   Probiotic Product (PROBIOTIC DAILY PO) Take by mouth.   simvastatin  (ZOCOR ) 40 MG tablet Take 40 mg by mouth daily at 6 PM.   Turmeric 500 MG CAPS Take 1 capsule by mouth daily. Resume 5 days post-op as needed   vitamin E  400 UNIT capsule Take 1 capsule (400 Units total) by mouth daily. Resume 5 days post-op   Facility-Administered Encounter Medications as of 06/18/2024  Medication   denosumab  (PROLIA ) injection 60 mg   [START ON 08/07/2024] denosumab  (PROLIA ) injection 60 mg   Hearing/Vision screen No results found. Immunizations and Health Maintenance Health Maintenance  Topic Date Due   DTaP/Tdap/Td (2 - Td or Tdap) 06/15/2023   COVID-19 Vaccine (8 - 2025-26 season) 03/29/2024   Medicare Annual Wellness (AWV)  06/18/2025   Pneumococcal Vaccine: 50+ Years  Completed   Influenza Vaccine  Completed   Bone Density Scan  Completed   Zoster Vaccines- Shingrix  Completed   Meningococcal B Vaccine  Aged Out        Assessment/Plan:  This is a routine wellness examination for Exilda.  Patient Care Team: Domenica Harlene LABOR, MD as PCP - General (Family Medicine) Cleotilde Sewer, OD [facility])  I have personally reviewed and noted the following in the patient's chart:   Medical and social history Use of alcohol, tobacco or illicit drugs  Current medications and supplements including opioid prescriptions. Functional ability and status Nutritional status Physical activity Advanced directives List of other physicians Hospitalizations, surgeries, and ER visits in previous 12 months Vitals Screenings to include cognitive, depression, and falls Referrals and appointments  Orders Placed This Encounter  Procedures    DG Bone Density    Standing Status:   Future    Expected Date:   06/18/2024    Expiration Date:   06/18/2025    Reason for Exam (SYMPTOM  OR DIAGNOSIS REQUIRED):   osteoporosis    Preferred imaging location?:   [facility]   In addition, I have reviewed and discussed with patient certain preventive protocols, quality metrics, and best practice recommendations. A written personalized care plan for preventive services as well as general preventive health recommendations were provided to patient.   Lolita Libra, CMA   06/18/2024   Return in 1 year (on 06/18/2025).  After Visit Summary: (In Person-Printed) AVS printed and given to the patient  Nurse Notes: nothing significant to report

## 2024-06-18 NOTE — Patient Instructions (Addendum)
 [name],  Thank you for taking the time for your Medicare Wellness Visit. I appreciate your continued commitment to your health goals. Please review the care plan we discussed, and feel free to reach out if I can assist you further.  Please note that Annual Wellness Visits do not include a physical exam. Some assessments may be limited, especially if the visit was conducted virtually. If needed, we may recommend an in-person follow-up with your provider.  Ongoing Care Seeing your primary care provider every 3 to 6 months helps us  monitor your health and provide consistent, personalized care.   Dr Domenica: 08/19/24 1:20pm Medicare AWV: 07/01/25 1pm, in person  Referrals If a referral was made during today's visit and you haven't received any updates within two weeks, please contact the referred provider directly to check on the status.  Bone Density ([facility]):  [phone]  Recommended Screenings:  You will need to get the following vaccines at your local pharmacy: tetanus  Health Maintenance  Topic Date Due   DTaP/Tdap/Td vaccine (2 - Td or Tdap) 06/15/2023   COVID-19 Vaccine (8 - 2025-26 season) 03/29/2024   Medicare Annual Wellness Visit  06/18/2025   Pneumococcal Vaccine for age over 23  Completed   Flu Shot  Completed   Osteoporosis screening with Bone Density Scan  Completed   Zoster (Shingles) Vaccine  Completed   Meningitis B Vaccine  Aged Out       06/18/2024    1:03 PM  Advanced Directives  Does Patient Have a Medical Advance Directive? Yes  Type of Estate Agent of [place];Living will  Does patient want to make changes to medical advance directive? No - Patient declined  Copy of Healthcare Power of Attorney in Chart? No - copy requested   Bring a copy of your health care power of attorney and living will to the office to be added to your chart at your convenience. You can mail a copy to [facility] [address]  [address] [place], [place] [address] or email to [email or web address]   Vision: Annual vision screenings are recommended for early detection of glaucoma, cataracts, and diabetic retinopathy. These exams can also reveal signs of chronic conditions such as diabetes and high blood pressure.  Dental: Annual dental screenings help detect early signs of oral cancer, gum disease, and other conditions linked to overall health, including heart disease and diabetes.  Please see the attached documents for additional preventive care recommendations.

## 2024-06-25 ENCOUNTER — Ambulatory Visit: Payer: Self-pay

## 2024-06-25 DIAGNOSIS — I493 Ventricular premature depolarization: Secondary | ICD-10-CM

## 2024-06-28 ENCOUNTER — Telehealth: Payer: Self-pay

## 2024-06-28 NOTE — Telephone Encounter (Signed)
 Pt viewed monitor results on My Chart per Dr. Madireddy's note. Routed to PCP.

## 2024-07-26 ENCOUNTER — Other Ambulatory Visit: Payer: Self-pay | Admitting: Family Medicine

## 2024-07-26 DIAGNOSIS — I10 Essential (primary) hypertension: Secondary | ICD-10-CM

## 2024-07-26 MED ORDER — HYDROCHLOROTHIAZIDE 25 MG PO TABS: 25.0000 mg | ORAL_TABLET | Freq: Every day | ORAL | 1 refills | Status: AC

## 2024-07-26 MED ORDER — CARVEDILOL 12.5 MG PO TABS: 12.5000 mg | ORAL_TABLET | Freq: Every day | ORAL | 1 refills | Status: AC

## 2024-07-26 NOTE — Telephone Encounter (Signed)
 Copied from CRM [id number]. Topic: Clinical - Medication Refill >> Jul 26, 2024  3:36 PM Thersia C wrote: Medication: carvedilol  (COREG ) 12.5 MG tablet hydrochlorothiazide  (HYDRODIURIL ) 25 MG tablet  Has the patient contacted their pharmacy? Yes (Agent: If no, request that the patient contact the pharmacy for the refill. If patient does not wish to contact the pharmacy document the reason why and proceed with request.) (Agent: If yes, when and what did the pharmacy advise?)  This is the patient's preferred pharmacy:  [facility] - [place], [place] - [address] [place] [address] Phone: [phone] Fax: [phone]  Is this the correct pharmacy for this prescription? Yes If no, delete pharmacy and type the correct one.   Has the prescription been filled recently? No  Is the patient out of the medication? Yes  Has the patient been seen for an appointment in the last year OR does the patient have an upcoming appointment? Yes  Can we respond through MyChart? Yes  Agent: Please be advised that Rx refills may take up to 3 business days. We ask that you follow-up with your pharmacy.

## 2024-08-02 ENCOUNTER — Telehealth: Payer: Self-pay

## 2024-08-02 NOTE — Telephone Encounter (Signed)
 Returned daughters call and pt was scheduled with Harlene Wheeler CHOL on 08/04/24 @ 3:40 PM.

## 2024-08-02 NOTE — Telephone Encounter (Signed)
 Copied from CRM [id number]. Topic: Clinical - Medication Question >> Aug 02, 2024  8:08 AM Carlyon D wrote: Reason for CRM: Pt daughter Adrien calling stating mom needs an appt to get ears cleaned out. Next available appt is not until April. Daughter is asking if this can be a nurse visit to get her in sooner as its just a ear cleaning.. Daughter Adrien is asking for a call back today to see if pt can be scheduled for nurse visit please give daughter miss debra a call

## 2024-08-03 ENCOUNTER — Telehealth: Payer: Self-pay

## 2024-08-03 NOTE — Telephone Encounter (Signed)
 Prolia  VOB initiated via [email or web address]  Next Prolia  inj DUE: 08/11/24

## 2024-08-04 ENCOUNTER — Ambulatory Visit (INDEPENDENT_AMBULATORY_CARE_PROVIDER_SITE_OTHER): Admitting: Student

## 2024-08-04 ENCOUNTER — Encounter: Payer: Self-pay | Admitting: Student

## 2024-08-04 VITALS — BP 144/79 | HR 88 | Ht <= 58 in | Wt 102.6 lb

## 2024-08-04 DIAGNOSIS — H6123 Impacted cerumen, bilateral: Secondary | ICD-10-CM

## 2024-08-04 HISTORY — DX: Impacted cerumen, bilateral: H61.23

## 2024-08-04 NOTE — Progress Notes (Signed)
" ° °  Acute Office Visit  Subjective:     Patient ID: Jennifer Wilkins, female    DOB: 1932-05-05, 89 y.o.   MRN: [id number]  No chief complaint on file.   HPI Patient is in today for acute visit patient was seen yesterday at [facility] for hearing exam and they were unable to perform hearing exam due to ears being significantly blocked with cerumen.  Upon exam today ears are impacted bilateral cerumen.  Patient denies fever, chills, SOB, CP, palpitations, dyspnea, edema, HA, vision changes, N/V/D, abdominal pain, urinary symptoms, rash, weight changes, and recent illness or hospitalizations.   ROS     See HPI Objective:    BP (!) 150/84   Pulse 88   Ht 4' 9 (1.448 m)   Wt 102 lb 9.6 oz (46.5 kg)   SpO2 98%   BMI 22.20 kg/m    Physical Exam General: No acute distress.  Eyes: Normal conjunctiva, anicteric. Round symmetric pupils.  ENT: +b/l impacted cerumen, external ear canal normal Respiratory: CTAB. Respirations are non-labored. No wheezing.  Skin: Warm. No rashes or ulcers.  Psych: Alert and oriented. Cooperative CV: RRR. No murmur. No lower extremity edema.  MSK: Normal ambulation. No clubbing or cyanosis.  Neuro:  CN II-XII grossly normal.     No results found for any visits on 08/04/24.      Assessment & Plan:   Problem List Items Addressed This Visit       Nervous and Auditory   Bilateral impacted cerumen - Primary   Relevant Orders   Ambulatory referral to ENT   Unable to clear b/l impaction, will refer to ENT for further evaluation. Procedure: Ear irrigation performed by [facility] using warm water. Cerumen was not successfully cleared. Patient tolerated the procedure well without complications.   No orders of the defined types were placed in this encounter.   No follow-ups on file.  Harlene LITTIE Jolly, NP   "

## 2024-08-10 ENCOUNTER — Other Ambulatory Visit (HOSPITAL_COMMUNITY): Payer: Self-pay

## 2024-08-10 NOTE — Telephone Encounter (Signed)
 Pt ready for scheduling for PROLIA  on or after : 08/11/24  Option# 1: Buy/Bill (Office supplied medication)  Out-of-pocket cost due at time of clinic visit: $0  Number of injection/visits approved: ---  Primary: MEDICARE Prolia  co-insurance: 0% Admin fee co-insurance: 0%  Secondary: TRICARE FOR LIFE Prolia  co-insurance:  Admin fee co-insurance:   Medical Benefit Details: Date Benefits were checked: 08/09/24 Deductible: $0 Met of $283 Required/ Coinsurance: 0%/ Admin Fee: 0%  Prior Auth: N/A PA# Expiration Date:   # of doses approved: ----------------------------------------------------------------------- Option# 2- Med Obtained from pharmacy:  Pharmacy benefit: Copay $--- (Paid to pharmacy) Admin Fee: --- (Pay at clinic)  Prior Auth: --- PA# Expiration Date:   # of doses approved:   If patient wants fill through the pharmacy benefit please send prescription to: ---, and include estimated need by date in rx notes. Pharmacy will ship medication directly to the office.  Patient NOT eligible for Prolia  Copay Card. Copay Card can make patient's cost as little as $25. Link to apply: [email or web address]://www.[email or web address]/copay  ** This summary of benefits is an estimation of the patient's out-of-pocket cost. Exact cost may very based on individual plan coverage.

## 2024-08-10 NOTE — Telephone Encounter (Signed)
 [name]

## 2024-08-15 NOTE — Assessment & Plan Note (Signed)
 Well controlled, no changes to meds. Encouraged heart healthy diet such as the DASH diet and exercise as tolerated.

## 2024-08-15 NOTE — Assessment & Plan Note (Signed)
 Encouraged heart healthy diet, increase exercise, avoid trans fats, consider a krill oil cap daily

## 2024-08-15 NOTE — Assessment & Plan Note (Signed)
 [id number] acceptable, minimize simple carbs. Increase exercise as tolerated.

## 2024-08-15 NOTE — Progress Notes (Signed)
 "  Subjective:    Patient ID: Jennifer Wilkins, female    DOB: Nov 14, 1931, 89 y.o.   MRN: [id number]  No chief complaint on file.   HPI Discussed the use of AI scribe software for clinical note transcription with the patient, who gave verbal consent to proceed.  History of Present Illness Jennifer Wilkins is a 89 year old female who presents with chronic leg and foot pain.  She experiences persistent aching pain in her legs and feet, which worsens throughout the day and is most severe at the end of the day. In the morning, she experiences pain upon waking but finds that after some exercise, such as moving her ankles, she can walk slowly. The pain becomes tolerable after being up for a couple of hours.  She is currently using Tylenol  for pain management, taking one 650 mg tablet in the morning and two in the evening. She also uses Voltaren gel twice daily. She has tried elevating her feet at night by placing them on a pillow, which she does once a day.  She has had her earwax removed recently, which has improved her hearing. She plans to visit [facility] to assess the need for a new hearing aid.  She has stopped taking vitamin D  supplements.    Past Medical History:  Diagnosis Date   Anemia 09/30/2016   Arthritis    Essential hypertension 04/24/2012   Facial lesion 05/03/2015   Hearing loss 01/21/2014   Recent testing showed right ear at 84 % and left year at 16%     HTN (hypertension)    Hypercholesteremia    Hyperglycemia 05/05/2017   Hyperlipidemia, mixed 04/24/2012   Hypocalcemia 06/19/2013   Hyponatremia 04/10/2020   Low back pain radiating to both legs 08/27/2015   Medicare annual wellness visit, subsequent 05/03/2015   Osteoporosis 04/24/2012   Other fatigue 05/05/2017   Preventative health care 06/02/2016   Spinal stenosis of lumbar region 09/11/2016   Stress reaction 04/25/2015   Thrombocytosis 12/30/2016   Urinary frequency 05/05/2017   Vertigo 07/14/2018   Vitamin D   deficiency 04/24/2012    Past Surgical History:  Procedure Laterality Date   CHOLECYSTECTOMY     GALLBLADDER SURGERY  1984   Gallstone removal   LUMBAR LAMINECTOMY/DECOMPRESSION MICRODISCECTOMY N/A 09/11/2016   Procedure: Microlumbar Decompression L3-4 and L4-5, lateral mass fusion at L4-5 using autologous, autograft bone;  Surgeon: Reyes Billing, MD;  Location: [facility];  Service: Orthopedics;  Laterality: N/A;    Family History  Problem Relation Age of Onset   Osteoporosis Mother    Ulcers Father    Kidney disease Paternal Grandfather     Social History   Socioeconomic History   Marital status: Widowed    Spouse name: Not on file   Number of children: Not on file   Years of education: Not on file   Highest education level: Not on file  Occupational History   Not on file  Tobacco Use   Smoking status: Never   Smokeless tobacco: Never  Substance and Sexual Activity   Alcohol use: No   Drug use: No   Sexual activity: Not Currently  Other Topics Concern   Not on file  Social History Narrative   Widowed July 2017   Social Drivers of Health   Tobacco Use: Low Risk (08/04/2024)   Patient History    Smoking Tobacco Use: Never    Smokeless Tobacco Use: Never    Passive Exposure: Not on file  Financial Resource  Strain: Low Risk (06/18/2023)   Overall Financial Resource Strain (CARDIA)    Difficulty of Paying Living Expenses: Not hard at all  Food Insecurity: No Food Insecurity (06/18/2024)   Epic    Worried About Programme Researcher, Broadcasting/film/video in the Last Year: Never true    Ran Out of Food in the Last Year: Never true  Transportation Needs: No Transportation Needs (06/18/2024)   Epic    Lack of Transportation (Medical): No    Lack of Transportation (Non-Medical): No  Physical Activity: Inactive (06/18/2024)   Exercise Vital Sign    Days of Exercise per Week: 0 days    Minutes of Exercise per Session: 0 min  Stress: No Stress Concern Present (06/18/2024)   [employer] of  Occupational Health - Occupational Stress Questionnaire    Feeling of Stress: Not at all  Social Connections: Moderately Integrated (06/18/2024)   Social Connection and Isolation Panel    Frequency of Communication with Friends and Family: Three times a week    Frequency of Social Gatherings with Friends and Family: Three times a week    Attends Religious Services: More than 4 times per year    Active Member of Clubs or Organizations: No    Attends Banker Meetings: More than 4 times per year    Marital Status: Widowed  Intimate Partner Violence: Not At Risk (06/18/2024)   Epic    Fear of Current or Ex-Partner: No    Emotionally Abused: No    Physically Abused: No    Sexually Abused: No  Depression (PHQ2-9): Low Risk (08/04/2024)   Depression (PHQ2-9)    PHQ-2 Score: 0  Alcohol Screen: Low Risk (06/18/2023)   Alcohol Screen    Last Alcohol Screening Score (AUDIT): 0  Housing: Low Risk (06/18/2024)   Epic    Unable to Pay for Housing in the Last Year: No    Number of Times Moved in the Last Year: 0    Homeless in the Last Year: No  Utilities: Not At Risk (06/18/2024)   Epic    Threatened with loss of utilities: No  Health Literacy: Adequate Health Literacy (06/18/2023)   B1300 Health Literacy    Frequency of need for help with medical instructions: Never    Outpatient Medications Prior to Visit  Medication Sig Dispense Refill   aspirin  81 MG tablet Take 1 tablet (81 mg total) by mouth daily. Resume 4 days post-op 30 tablet    Calcium Carbonate-Vitamin D  600-400 MG-UNIT tablet Take 1 tablet by mouth 2 (two) times daily.     carvedilol  (COREG ) 12.5 MG tablet Take 1 tablet (12.5 mg total) by mouth daily. 90 tablet 1   Cyanocobalamin  (VITAMIN B-12 CR) 1500 MCG TBCR Take by mouth daily.     Diclofenac Sodium (VOLTAREN EX) Apply topically.     Ferrous Sulfate (IRON PO) Take by mouth daily.     FIBER ADULT GUMMIES PO Take 2 each by mouth.     hydrochlorothiazide   (HYDRODIURIL ) 25 MG tablet Take 1 tablet (25 mg total) by mouth daily. 90 tablet 1   Misc Natural Products (OSTEO BI-FLEX ADV JOINT SHIELD) TABS Take 1 tablet by mouth daily. Resume 5 days post-op     Multiple Vitamins-Minerals (MULTIVITAMIN WITH MINERALS) tablet Take 1 tablet by mouth daily. Resume 5 days post-op     Omega-3 Fatty Acids (FISH OIL) 1000 MG CAPS Take by mouth.     Probiotic Product (PROBIOTIC DAILY PO) Take by mouth.  simvastatin  (ZOCOR ) 40 MG tablet Take 40 mg by mouth daily at 6 PM.     Turmeric 500 MG CAPS Take 1 capsule by mouth daily. Resume 5 days post-op as needed     vitamin E  400 UNIT capsule Take 1 capsule (400 Units total) by mouth daily. Resume 5 days post-op     Facility-Administered Medications Prior to Visit  Medication Dose Route Frequency Provider Last Rate Last Admin   denosumab  (PROLIA ) injection 60 mg  60 mg Subcutaneous Once Marqual Mi A, MD       denosumab  (PROLIA ) injection 60 mg  60 mg Subcutaneous Q6 months Domenica Harlene LABOR, MD        Allergies[1]  Review of Systems  Constitutional:  Negative for fever and malaise/fatigue.  HENT:  Positive for hearing loss. Negative for congestion.   Eyes:  Negative for blurred vision.  Respiratory:  Negative for shortness of breath.   Cardiovascular:  Negative for chest pain, palpitations and leg swelling.  Gastrointestinal:  Negative for abdominal pain, blood in stool and nausea.  Genitourinary:  Negative for dysuria and frequency.  Musculoskeletal:  Positive for joint pain and myalgias. Negative for falls.  Skin:  Negative for rash.  Neurological:  Negative for dizziness, loss of consciousness and headaches.  Endo/Heme/Allergies:  Negative for environmental allergies.  Psychiatric/Behavioral:  Negative for depression. The patient is not nervous/anxious.        Objective:    Physical Exam Constitutional:      General: She is not in acute distress.    Appearance: Normal appearance. She is  well-developed. She is not toxic-appearing.  HENT:     Head: Normocephalic and atraumatic.     Right Ear: External ear normal.     Left Ear: External ear normal.     Nose: Nose normal.  Eyes:     General:        Right eye: No discharge.        Left eye: No discharge.     Conjunctiva/sclera: Conjunctivae normal.  Neck:     Thyroid : No thyromegaly.  Cardiovascular:     Rate and Rhythm: Normal rate and regular rhythm.     Heart sounds: Normal heart sounds. No murmur heard. Pulmonary:     Effort: Pulmonary effort is normal. No respiratory distress.     Breath sounds: Normal breath sounds.  Abdominal:     General: Bowel sounds are normal.     Palpations: Abdomen is soft.     Tenderness: There is no abdominal tenderness. There is no guarding.  Musculoskeletal:        General: Normal range of motion.     Cervical back: Neck supple.  Lymphadenopathy:     Cervical: No cervical adenopathy.  Skin:    General: Skin is warm and dry.  Neurological:     Mental Status: She is alert and oriented to person, place, and time.  Psychiatric:        Mood and Affect: Mood normal.        Behavior: Behavior normal.        Thought Content: Thought content normal.        Judgment: Judgment normal.    There were no vitals taken for this visit. Wt Readings from Last 3 Encounters:  08/04/24 102 lb 9.6 oz (46.5 kg)  06/18/24 101 lb (45.8 kg)  06/02/24 101 lb 12.8 oz (46.2 kg)    Diabetic Foot Exam - Simple   No data filed  Lab Results  Component Value Date   WBC 4.6 03/18/2024   HGB 12.8 03/18/2024   HCT 38.9 03/18/2024   PLT 217.0 03/18/2024   GLUCOSE 99 03/18/2024   CHOL 229 (H) 03/18/2024   TRIG 92.0 03/18/2024   HDL 105.00 03/18/2024   LDLCALC 106 (H) 03/18/2024   ALT 13 03/18/2024   AST 20 03/18/2024   NA 141 03/18/2024   K 4.4 03/18/2024   CL 103 03/18/2024   CREATININE 0.87 03/18/2024   BUN 29 (H) 03/18/2024   CO2 29 03/18/2024   TSH 1.54 03/18/2024   HGBA1C 5.8  03/18/2024    Lab Results  Component Value Date   TSH 1.54 03/18/2024   Lab Results  Component Value Date   WBC 4.6 03/18/2024   HGB 12.8 03/18/2024   HCT 38.9 03/18/2024   MCV 92.2 03/18/2024   PLT 217.0 03/18/2024   Lab Results  Component Value Date   NA 141 03/18/2024   K 4.4 03/18/2024   CO2 29 03/18/2024   GLUCOSE 99 03/18/2024   BUN 29 (H) 03/18/2024   CREATININE 0.87 03/18/2024   BILITOT 0.7 03/18/2024   ALKPHOS 56 03/18/2024   AST 20 03/18/2024   ALT 13 03/18/2024   PROT 7.1 03/18/2024   ALBUMIN 4.3 03/18/2024   CALCIUM 9.4 03/18/2024   ANIONGAP 7 09/12/2016   GFR 58.57 (L) 03/18/2024   Lab Results  Component Value Date   CHOL 229 (H) 03/18/2024   Lab Results  Component Value Date   HDL 105.00 03/18/2024   Lab Results  Component Value Date   LDLCALC 106 (H) 03/18/2024   Lab Results  Component Value Date   TRIG 92.0 03/18/2024   Lab Results  Component Value Date   CHOLHDL 2 03/18/2024   Lab Results  Component Value Date   HGBA1C 5.8 03/18/2024       Assessment & Plan:  Vitamin D  deficiency Assessment & Plan: Supplement and monitor    Osteoporosis, unspecified osteoporosis type, unspecified pathological fracture presence Assessment & Plan: Encouraged to get adequate exercise, calcium and vitamin d  intake consider repeat Dexa scan tolerating Prolia    Hyperlipidemia, mixed Assessment & Plan: Encouraged heart healthy diet, increase exercise, avoid trans fats, consider a krill oil cap daily   Hyperglycemia Assessment & Plan: hgba1c acceptable, minimize simple carbs. Increase exercise as tolerated.    Hypertension, unspecified type Assessment & Plan: Well controlled, no changes to meds. Encouraged heart healthy diet such as the DASH diet and exercise as tolerated.       Assessment and Plan Assessment & Plan Chronic left lower extremity pain and swelling due to osteoarthritis Chronic pain and swelling in the left lower  extremity, primarily due to osteoarthritis. Pain is worse in the morning and at the end of the day. Current management includes Tylenol  and Voltaren. Physical therapy is considered beneficial for balance and activity, though not expected to alleviate pain. - Continue Tylenol , 2 tablets in the morning and 2 in the evening, not exceeding 3000 mg per day. - Continue Voltaren topical application twice daily. - Consider alternating Voltaren with Biofreeze, especially in the evenings. - Use heat in the morning and ice in the evening for pain management. - Elevate feet twice daily, in the afternoon and at bedtime, for 15 minutes each. - Ordered physical therapy at [facility] to substitute for walking. - Consider purchasing a larger heating pad and ice packs for better coverage.  Osteoporosis Management is ongoing with Prolia . - Continue Prolia   as scheduled. - Scheduled follow-up in six months for Prolia  administration.  Vitamin D  deficiency Vitamin D  levels were previously elevated, and supplementation has been stopped. Monitoring is ongoing to ensure levels return to normal. - Ordered blood work to recheck vitamin D  levels.  Immunization for pneumococcal disease Pneumococcal vaccination is due for update. Previous vaccination was over a decade ago, and new recommendations suggest a booster with Prevnar 20. - Administered Prevnar 20 vaccine today.  Recording duration: 24 minutes     Harlene Horton, MD    [1] No Known Allergies  "

## 2024-08-15 NOTE — Assessment & Plan Note (Signed)
 Encouraged to get adequate exercise, calcium and vitamin d  intake consider repeat Dexa scan tolerating Prolia 

## 2024-08-15 NOTE — Assessment & Plan Note (Signed)
 Supplement and monitor

## 2024-08-16 ENCOUNTER — Ambulatory Visit (HOSPITAL_BASED_OUTPATIENT_CLINIC_OR_DEPARTMENT_OTHER)
Admission: RE | Admit: 2024-08-16 | Discharge: 2024-08-16 | Disposition: A | Source: Ambulatory Visit | Attending: Family Medicine | Admitting: Family Medicine

## 2024-08-16 DIAGNOSIS — M810 Age-related osteoporosis without current pathological fracture: Secondary | ICD-10-CM | POA: Insufficient documentation

## 2024-08-17 ENCOUNTER — Ambulatory Visit: Payer: Self-pay | Admitting: Family Medicine

## 2024-08-18 ENCOUNTER — Ambulatory Visit (INDEPENDENT_AMBULATORY_CARE_PROVIDER_SITE_OTHER): Admitting: Physician Assistant

## 2024-08-18 ENCOUNTER — Encounter (INDEPENDENT_AMBULATORY_CARE_PROVIDER_SITE_OTHER): Payer: Self-pay | Admitting: Physician Assistant

## 2024-08-18 VITALS — BP 120/77 | HR 90 | Ht <= 58 in | Wt 102.0 lb

## 2024-08-18 DIAGNOSIS — H903 Sensorineural hearing loss, bilateral: Secondary | ICD-10-CM | POA: Diagnosis not present

## 2024-08-18 DIAGNOSIS — H6123 Impacted cerumen, bilateral: Secondary | ICD-10-CM

## 2024-08-19 ENCOUNTER — Encounter: Payer: Self-pay | Admitting: Family Medicine

## 2024-08-19 ENCOUNTER — Ambulatory Visit: Admitting: Family Medicine

## 2024-08-19 VITALS — BP 130/78 | HR 60 | Temp 97.1°F | Resp 16 | Ht <= 58 in | Wt 102.2 lb

## 2024-08-19 DIAGNOSIS — Z23 Encounter for immunization: Secondary | ICD-10-CM | POA: Diagnosis not present

## 2024-08-19 DIAGNOSIS — E559 Vitamin D deficiency, unspecified: Secondary | ICD-10-CM

## 2024-08-19 DIAGNOSIS — R739 Hyperglycemia, unspecified: Secondary | ICD-10-CM

## 2024-08-19 DIAGNOSIS — I10 Essential (primary) hypertension: Secondary | ICD-10-CM | POA: Diagnosis not present

## 2024-08-19 DIAGNOSIS — E782 Mixed hyperlipidemia: Secondary | ICD-10-CM | POA: Diagnosis not present

## 2024-08-19 DIAGNOSIS — M810 Age-related osteoporosis without current pathological fracture: Secondary | ICD-10-CM

## 2024-08-19 MED ORDER — DENOSUMAB 60 MG/ML ~~LOC~~ SOSY: 60.0000 mg | PREFILLED_SYRINGE | SUBCUTANEOUS | Status: AC

## 2024-08-19 NOTE — Progress Notes (Signed)
 Dear Dr. Wheeler, Here is my assessment for our mutual patient, Jennifer Wilkins. Thank you for allowing me the opportunity to care for your patient. Please do not hesitate to contact me should you have any other questions. Sincerely, Chyrl Cohen PA-C  Otolaryngology Clinic Note Referring provider: Dr. Wheeler HPI:  Jennifer Wilkins is a 89 y.o. female kindly referred by Dr. Wheeler   Discussed the use of AI scribe software for clinical note transcription with the patient, who gave verbal consent to proceed.  History of Present Illness   Jennifer Wilkins is a 89 year old female with sensorineural hearing loss who presents for evaluation of cerumen impaction and hearing difficulties.  She reports persistent hearing difficulties, leading to this third evaluation at a different facility.  She uses hearing aids and tends to insert them deeply, which may contribute to recurrent cerumen accumulation. Previous cerumen removal attempts by primary care providers using irrigation were unsuccessful; she has not previously undergone removal with microscopy.  She denies other otologic or systemic symptoms.           Independent Review of Additional Tests or Records:  none   PMH/Meds/All/SocHx/FamHx/ROS:   Past Medical History:  Diagnosis Date   Anemia 09/30/2016   Arthritis    Essential hypertension 04/24/2012   Facial lesion 05/03/2015   Hearing loss 01/21/2014   Recent testing showed right ear at 84 % and left year at 16%     HTN (hypertension)    Hypercholesteremia    Hyperglycemia 05/05/2017   Hyperlipidemia, mixed 04/24/2012   Hypocalcemia 06/19/2013   Hyponatremia 04/10/2020   Low back pain radiating to both legs 08/27/2015   Medicare annual wellness visit, subsequent 05/03/2015   Osteoporosis 04/24/2012   Other fatigue 05/05/2017   Preventative health care 06/02/2016   Spinal stenosis of lumbar region 09/11/2016   Stress reaction 04/25/2015   Thrombocytosis 12/30/2016   Urinary  frequency 05/05/2017   Vertigo 07/14/2018   Vitamin D  deficiency 04/24/2012     Past Surgical History:  Procedure Laterality Date   CHOLECYSTECTOMY     GALLBLADDER SURGERY  1984   Gallstone removal   LUMBAR LAMINECTOMY/DECOMPRESSION MICRODISCECTOMY N/A 09/11/2016   Procedure: Microlumbar Decompression L3-4 and L4-5, lateral mass fusion at L4-5 using autologous, autograft bone;  Surgeon: Reyes Billing, MD;  Location: [facility];  Service: Orthopedics;  Laterality: N/A;    Family History  Problem Relation Age of Onset   Osteoporosis Mother    Ulcers Father    Kidney disease Paternal Grandfather      Social Connections: Moderately Integrated (06/18/2024)   Social Connection and Isolation Panel    Frequency of Communication with Friends and Family: Three times a week    Frequency of Social Gatherings with Friends and Family: Three times a week    Attends Religious Services: More than 4 times per year    Active Member of Clubs or Organizations: No    Attends Banker Meetings: More than 4 times per year    Marital Status: Widowed     Current Medications[1]   Physical Exam:   BP 120/77   Pulse 90   Ht 4' 8 (1.422 m)   Wt 102 lb (46.3 kg)   SpO2 94%   BMI 22.87 kg/m   Pertinent Findings  CN II-XII grossly intact Bilateral cerumen impaction  Weber 512: equal Rinne 512: AC > BC b/l  Anterior rhinoscopy: Septum midline; bilateral inferior turbinates with WNL No lesions of oral cavity/oropharynx No obviously palpable  neck masses/lymphadenopathy/thyromegaly No respiratory distress or stridor   Seprately Identifiable Procedures:  Procedure: Bilateral ear microscopy and cerumen removal using microscope (CPT [phone]) - Mod 50 Pre-procedure diagnosis: bilateral cerumen impaction external auditory canals Post-procedure diagnosis: same Indication: bilateral cerumen impaction; given patient's otologic complaints and history as well as for improved and comprehensive  examination of external ear and tympanic membrane, bilateral otologic examination using microscope was performed and impacted cerumen removed  Procedure: Patient was placed semi-recumbent. Both ear canals were examined using the microscope with findings above. Cerumen removed from bilateral external auditory canals using suction and currette with improvement in EAC examination and patency. Left: EAC was patent. TM was intact . Middle ear was aerated. Drainage: none Right: EAC was patent. TM was intact . Middle ear was aerated . Drainage: none Patient tolerated the procedure well.   Impression & Plans:  Jennifer Wilkins is a 89 y.o. female with the following   Assessment and Plan    Cerumen impaction Cerumen impaction. Significant hearing improvement post-removal. - Removed cerumen from ear canals. - Recommended follow-up in six months to assess recurrence and cerumen accumulation.  Sensorineural hearing loss Chronic sensorineural hearing loss managed with hearing aids. Hearing improved post-cerumen removal. - Instructed her to resume use of hearing aids post-cerumen removal. - Planned reassessment of hearing and cerumen status at six-month follow-up.           - f/u 6 months   Thank you for allowing me the opportunity to care for your patient. Please do not hesitate to contact me should you have any other questions.  Sincerely, Chyrl Cohen PA-C [facility] Phone: [phone] Fax: [phone]  08/19/2024, 8:12 AM        [1]  Current Outpatient Medications:    aspirin  81 MG tablet, Take 1 tablet (81 mg total) by mouth daily. Resume 4 days post-op, Disp: 30 tablet, Rfl:    Calcium Carbonate-Vitamin D  600-400 MG-UNIT tablet, Take 1 tablet by mouth 2 (two) times daily., Disp: , Rfl:    carvedilol  (COREG ) 12.5 MG tablet, Take 1 tablet (12.5 mg total) by mouth daily., Disp: 90 tablet, Rfl: 1   Cyanocobalamin  (VITAMIN B-12 CR) 1500 MCG TBCR, Take by mouth daily.,  Disp: , Rfl:    Diclofenac Sodium (VOLTAREN EX), Apply topically., Disp: , Rfl:    Ferrous Sulfate (IRON PO), Take by mouth daily., Disp: , Rfl:    FIBER ADULT GUMMIES PO, Take 2 each by mouth., Disp: , Rfl:    hydrochlorothiazide  (HYDRODIURIL ) 25 MG tablet, Take 1 tablet (25 mg total) by mouth daily., Disp: 90 tablet, Rfl: 1   Misc Natural Products (OSTEO BI-FLEX ADV JOINT SHIELD) TABS, Take 1 tablet by mouth daily. Resume 5 days post-op, Disp: , Rfl:    Multiple Vitamins-Minerals (MULTIVITAMIN WITH MINERALS) tablet, Take 1 tablet by mouth daily. Resume 5 days post-op, Disp: , Rfl:    Omega-3 Fatty Acids (FISH OIL) 1000 MG CAPS, Take by mouth., Disp: , Rfl:    Probiotic Product (PROBIOTIC DAILY PO), Take by mouth., Disp: , Rfl:    simvastatin  (ZOCOR ) 40 MG tablet, Take 40 mg by mouth daily at 6 PM., Disp: , Rfl:    Turmeric 500 MG CAPS, Take 1 capsule by mouth daily. Resume 5 days post-op as needed, Disp: , Rfl:    vitamin E  400 UNIT capsule, Take 1 capsule (400 Units total) by mouth daily. Resume 5 days post-op, Disp: , Rfl:   Current Facility-Administered Medications:    denosumab  (  PROLIA ) injection 60 mg, 60 mg, Subcutaneous, Once, Domenica Blackbird A, MD   denosumab  (PROLIA ) injection 60 mg, 60 mg, Subcutaneous, Q6 months, Domenica Blackbird LABOR, MD

## 2024-08-19 NOTE — Patient Instructions (Addendum)
 RSV, Respiratory Syncitial Virus Vaccine, Arexvy   at pharmacy Tetanus booster is due  Annual flu and COVID

## 2024-08-20 ENCOUNTER — Ambulatory Visit: Payer: Self-pay | Admitting: Family Medicine

## 2024-08-20 ENCOUNTER — Encounter: Payer: Self-pay | Admitting: Family Medicine

## 2024-08-20 LAB — TSH: TSH: 1.41 u[IU]/mL (ref 0.35–5.50)

## 2024-08-20 LAB — LIPID PANEL
Cholesterol: 210 mg/dL — ABNORMAL HIGH (ref 28–200)
HDL: 91.5 mg/dL
LDL Cholesterol: 77 mg/dL (ref 10–99)
NonHDL: 118.85
Total CHOL/HDL Ratio: 2
Triglycerides: 210 mg/dL — ABNORMAL HIGH (ref 10.0–149.0)
VLDL: 42 mg/dL — ABNORMAL HIGH (ref 0.0–40.0)

## 2024-08-20 LAB — CBC WITH DIFFERENTIAL/PLATELET
Basophils Absolute: 0 K/uL (ref 0.0–0.1)
Basophils Relative: 0.8 % (ref 0.0–3.0)
Eosinophils Absolute: 0.1 K/uL (ref 0.0–0.7)
Eosinophils Relative: 2.5 % (ref 0.0–5.0)
HCT: 39.3 % (ref 36.0–46.0)
Hemoglobin: 13.1 g/dL (ref 12.0–15.0)
Lymphocytes Relative: 25.2 % (ref 12.0–46.0)
Lymphs Abs: 1.3 K/uL (ref 0.7–4.0)
MCHC: 33.3 g/dL (ref 30.0–36.0)
MCV: 93.4 fl (ref 78.0–100.0)
Monocytes Absolute: 0.6 K/uL (ref 0.1–1.0)
Monocytes Relative: 11.5 % (ref 3.0–12.0)
Neutro Abs: 3.1 K/uL (ref 1.4–7.7)
Neutrophils Relative %: 60 % (ref 43.0–77.0)
Platelets: 230 K/uL (ref 150.0–400.0)
RBC: 4.21 Mil/uL (ref 3.87–5.11)
RDW: 14 % (ref 11.5–15.5)
WBC: 5.2 K/uL (ref 4.0–10.5)

## 2024-08-20 LAB — COMPREHENSIVE METABOLIC PANEL WITH GFR
ALT: 11 U/L (ref 3–35)
AST: 15 U/L (ref 5–37)
Albumin: 4.2 g/dL (ref 3.5–5.2)
Alkaline Phosphatase: 54 U/L (ref 39–117)
BUN: 32 mg/dL — ABNORMAL HIGH (ref 6–23)
CO2: 29 meq/L (ref 19–32)
Calcium: 9.8 mg/dL (ref 8.4–10.5)
Chloride: 104 meq/L (ref 96–112)
Creatinine, Ser: 1.02 mg/dL (ref 0.40–1.20)
GFR: 48.25 mL/min — ABNORMAL LOW
Glucose, Bld: 98 mg/dL (ref 70–99)
Potassium: 4.2 meq/L (ref 3.5–5.1)
Sodium: 142 meq/L (ref 135–145)
Total Bilirubin: 0.5 mg/dL (ref 0.2–1.2)
Total Protein: 6.6 g/dL (ref 6.0–8.3)

## 2024-08-20 LAB — VITAMIN D 25 HYDROXY (VIT D DEFICIENCY, FRACTURES): VITD: 72.58 ng/mL (ref 30.00–100.00)

## 2024-08-20 LAB — HEMOGLOBIN A1C: Hgb A1c MFr Bld: 5.7 % (ref 4.6–6.5)

## 2024-08-20 NOTE — Progress Notes (Signed)
"  Pt reviewed via MyChart.   "

## 2024-08-25 ENCOUNTER — Telehealth: Payer: Self-pay

## 2024-08-25 ENCOUNTER — Other Ambulatory Visit: Payer: Self-pay | Admitting: Family Medicine

## 2024-08-25 DIAGNOSIS — R2681 Unsteadiness on feet: Secondary | ICD-10-CM

## 2024-08-25 DIAGNOSIS — M48061 Spinal stenosis, lumbar region without neurogenic claudication: Secondary | ICD-10-CM

## 2024-08-25 DIAGNOSIS — M545 Low back pain, unspecified: Secondary | ICD-10-CM

## 2024-08-25 NOTE — Telephone Encounter (Signed)
 Spoke w/ Laurie,PT @ [facility] and she said physical therapy order needs to be placed and faxed to [phone].

## 2024-08-25 NOTE — Telephone Encounter (Signed)
 Copied from CRM #[id number]. Topic: General - Other >> Aug 25, 2024 10:57 AM Burnard DEL wrote: Reason for CRM: Mitzie from [facility] called to give fax number to have  PT therapy orders sent to them. She stated that patient came by their office this morning and stated that our office was needing their fax number to send orders.  Fax#:[phone]

## 2024-08-26 NOTE — Telephone Encounter (Signed)
 Orders faxed.

## 2024-09-07 ENCOUNTER — Ambulatory Visit

## 2025-02-15 ENCOUNTER — Ambulatory Visit (INDEPENDENT_AMBULATORY_CARE_PROVIDER_SITE_OTHER): Admitting: Physician Assistant

## 2025-07-01 ENCOUNTER — Ambulatory Visit
